# Patient Record
Sex: Female | Born: 1980 | Race: Black or African American | Hispanic: No | Marital: Married | Smoking: Never smoker
Health system: Southern US, Community
[De-identification: ages and names within clinical notes are randomized; demographics above are authoritative.]

## PROBLEM LIST (undated history)

## (undated) DIAGNOSIS — T7840XA Allergy, unspecified, initial encounter: Secondary | ICD-10-CM

## (undated) DIAGNOSIS — J45909 Unspecified asthma, uncomplicated: Secondary | ICD-10-CM

## (undated) DIAGNOSIS — F32A Depression, unspecified: Secondary | ICD-10-CM

## (undated) DIAGNOSIS — F419 Anxiety disorder, unspecified: Secondary | ICD-10-CM

## (undated) DIAGNOSIS — G43909 Migraine, unspecified, not intractable, without status migrainosus: Secondary | ICD-10-CM

## (undated) DIAGNOSIS — I10 Essential (primary) hypertension: Secondary | ICD-10-CM

## (undated) DIAGNOSIS — L509 Urticaria, unspecified: Secondary | ICD-10-CM

## (undated) DIAGNOSIS — H9190 Unspecified hearing loss, unspecified ear: Secondary | ICD-10-CM

## (undated) DIAGNOSIS — IMO0001 Reserved for inherently not codable concepts without codable children: Secondary | ICD-10-CM

## (undated) DIAGNOSIS — F329 Major depressive disorder, single episode, unspecified: Secondary | ICD-10-CM

## (undated) DIAGNOSIS — L309 Dermatitis, unspecified: Secondary | ICD-10-CM

## (undated) HISTORY — DX: Reserved for inherently not codable concepts without codable children: IMO0001

## (undated) HISTORY — DX: Dermatitis, unspecified: L30.9

## (undated) HISTORY — PX: BREAST LUMPECTOMY: SHX2

## (undated) HISTORY — DX: Urticaria, unspecified: L50.9

## (undated) HISTORY — DX: Unspecified hearing loss, unspecified ear: H91.90

## (undated) HISTORY — PX: GASTRIC BYPASS: SHX52

---
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
## 2002-03-09 ENCOUNTER — Encounter: Payer: Self-pay | Admitting: Emergency Medicine

## 2002-03-09 ENCOUNTER — Emergency Department (HOSPITAL_COMMUNITY): Admission: EM | Admit: 2002-03-09 | Discharge: 2002-03-09 | Payer: Self-pay | Admitting: Emergency Medicine

## 2002-07-31 ENCOUNTER — Other Ambulatory Visit: Admission: RE | Admit: 2002-07-31 | Discharge: 2002-07-31 | Payer: Self-pay | Admitting: Family Medicine

## 2003-09-03 ENCOUNTER — Emergency Department (HOSPITAL_COMMUNITY): Admission: EM | Admit: 2003-09-03 | Discharge: 2003-09-03 | Payer: Self-pay | Admitting: Emergency Medicine

## 2004-02-15 ENCOUNTER — Emergency Department (HOSPITAL_COMMUNITY): Admission: EM | Admit: 2004-02-15 | Discharge: 2004-02-16 | Payer: Self-pay | Admitting: Emergency Medicine

## 2004-02-15 ENCOUNTER — Emergency Department (HOSPITAL_COMMUNITY): Admission: EM | Admit: 2004-02-15 | Discharge: 2004-02-15 | Payer: Self-pay | Admitting: Emergency Medicine

## 2004-12-11 ENCOUNTER — Emergency Department (HOSPITAL_COMMUNITY): Admission: EM | Admit: 2004-12-11 | Discharge: 2004-12-12 | Payer: Self-pay | Admitting: Emergency Medicine

## 2004-12-13 ENCOUNTER — Ambulatory Visit (HOSPITAL_COMMUNITY): Admission: RE | Admit: 2004-12-13 | Discharge: 2004-12-13 | Payer: Self-pay | Admitting: Emergency Medicine

## 2004-12-15 ENCOUNTER — Emergency Department (HOSPITAL_COMMUNITY): Admission: EM | Admit: 2004-12-15 | Discharge: 2004-12-15 | Payer: Self-pay | Admitting: Emergency Medicine

## 2005-01-22 ENCOUNTER — Other Ambulatory Visit: Admission: RE | Admit: 2005-01-22 | Discharge: 2005-01-22 | Payer: Self-pay | Admitting: Family Medicine

## 2007-06-09 ENCOUNTER — Ambulatory Visit (HOSPITAL_BASED_OUTPATIENT_CLINIC_OR_DEPARTMENT_OTHER): Admission: RE | Admit: 2007-06-09 | Discharge: 2007-06-09 | Payer: Self-pay | Admitting: Orthopedic Surgery

## 2007-08-01 ENCOUNTER — Emergency Department (HOSPITAL_COMMUNITY): Admission: EM | Admit: 2007-08-01 | Discharge: 2007-08-01 | Payer: Self-pay | Admitting: Emergency Medicine

## 2008-07-04 ENCOUNTER — Ambulatory Visit (HOSPITAL_COMMUNITY): Admission: RE | Admit: 2008-07-04 | Discharge: 2008-07-04 | Payer: Self-pay | Admitting: Obstetrics and Gynecology

## 2008-08-02 ENCOUNTER — Encounter: Admission: RE | Admit: 2008-08-02 | Discharge: 2008-08-02 | Payer: Self-pay | Admitting: Obstetrics and Gynecology

## 2009-08-22 ENCOUNTER — Encounter: Admission: RE | Admit: 2009-08-22 | Discharge: 2009-08-22 | Payer: Self-pay | Admitting: Occupational Medicine

## 2010-03-01 ENCOUNTER — Emergency Department (HOSPITAL_BASED_OUTPATIENT_CLINIC_OR_DEPARTMENT_OTHER): Admission: EM | Admit: 2010-03-01 | Discharge: 2010-03-01 | Payer: Self-pay | Admitting: Emergency Medicine

## 2010-03-03 ENCOUNTER — Ambulatory Visit: Payer: Self-pay | Admitting: Diagnostic Radiology

## 2010-03-03 ENCOUNTER — Ambulatory Visit (HOSPITAL_BASED_OUTPATIENT_CLINIC_OR_DEPARTMENT_OTHER): Admission: RE | Admit: 2010-03-03 | Discharge: 2010-03-03 | Payer: Self-pay | Admitting: Emergency Medicine

## 2010-10-31 LAB — URINALYSIS, ROUTINE W REFLEX MICROSCOPIC
Ketones, ur: NEGATIVE mg/dL
Specific Gravity, Urine: 1.01 (ref 1.005–1.030)
Urobilinogen, UA: 1 mg/dL (ref 0.0–1.0)

## 2010-10-31 LAB — DIFFERENTIAL
Basophils Absolute: 0.1 10*3/uL (ref 0.0–0.1)
Lymphs Abs: 1.8 10*3/uL (ref 0.7–4.0)
Monocytes Relative: 5 % (ref 3–12)

## 2010-10-31 LAB — COMPREHENSIVE METABOLIC PANEL
AST: 26 U/L (ref 0–37)
Albumin: 3.9 g/dL (ref 3.5–5.2)
Alkaline Phosphatase: 66 U/L (ref 39–117)
Calcium: 8.9 mg/dL (ref 8.4–10.5)
Chloride: 106 mEq/L (ref 96–112)
GFR calc Af Amer: >60 mL/min (ref >60)
Glucose, Bld: 93 mg/dL (ref 70–99)
Sodium: 140 mEq/L (ref 135–145)
Total Bilirubin: 0.7 mg/dL (ref 0.3–1.2)

## 2010-10-31 LAB — CBC
Hemoglobin: 13.1 g/dL (ref 12.0–15.0)
MCHC: 33.3 g/dL (ref 30.0–36.0)
MCV: 86.9 fL (ref 78.0–100.0)
Platelets: 253 10*3/uL (ref 150–400)
RDW: 13.7 % (ref 11.5–15.5)

## 2010-10-31 LAB — PREGNANCY, URINE: Preg Test, Ur: NEGATIVE

## 2010-12-29 NOTE — Op Note (Signed)
[name], [name]                 ACCOUNT NO.:  [id number]   MEDICAL RECORD NO.:  [id number]          PATIENT TYPE:  AMB   LOCATION:  DSC                          FACILITY:  [facility]   PHYSICIAN:  Katy Fitch. Sypher, M.D. DATE OF BIRTH:  01-17-1981   DATE OF PROCEDURE:  06/09/2007  DATE OF DISCHARGE:                               OPERATIVE REPORT   PREOPERATIVE DIAGNOSES:  1. 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  2. Recurrent dorsal ganglion adjacent to the distal pole scaphoid and      capitate, right wrist.  3. Status post prior excision of a dorsal ganglion.   POSTOPERATIVE DIAGNOSES:  1. 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  2. Recurrent dorsal ganglion adjacent to the distal pole scaphoid and      capitate, right wrist.  3. Status post prior excision of a dorsal ganglion.   OPERATION:  1. Right wrist arthrotomy with curettage of myxoid cyst dorsal aspect      of right lunate.  2. Resection of recurrent dorsal ganglion adjacent to distal pole of      scaphoid and capitate, right wrist.   OPERATIONS:  Josephine Igo, M.D.   ASSISTANT:  Annye Rusk, P.A.-C.   ANESTHESIA:  General by LMA; supervising anesthesiologist Dr. Michelle Piper.   SPECIMEN:  Is resected ganglion cyst wall.  Mucin was discarded.   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   She had a prior dorsal ganglion resection several years ago with  temporary resolution of her mass and pain predicament.   During the past year, she has developed recurrent mass on the dorsal  aspect of her right wrist with pain at the limits of motion in  dorsiflexion and palmar flexion.   Her clinical examination revealed pain out of proportion to a typical  dorsal extracapsular ganglion.  Therefore, plain films and MRI were  obtained.  She was noted  to have an intracapsular cyst eroding the  dorsal aspect of lunate.   This is likely the source of her discomfort rather than the recurrent  extracapsular ganglion.   Based on our MRI findings, we recommended that she proceed with  exploration of dorsal aspect of the wrist with arthrotomy to remove the  intracapsular ganglion and resection of her extracapsular cyst.   Preoperatively, we identified with her insurance carrier that this was a  pair of procedures rather than a single procedure.  After informed  consent, she was brought to the operating room at this time.   PROCEDURE:  Carla Buck was brought to the operating room and placed  in supine position on the operating table.   Following the induction of general anesthesia by LMA, the right arm was  prepped with Hibiclens soap solution and sterilely draped.  A pneumatic  tourniquet was applied to the proximal right brachium.   Following exsanguination of right arm with Esmarch bandage, the arterial  tourniquet on the proximal  brachium was inflated to 230 mmHg.   Procedure commenced with resection of the prior transverse surgical  scar.  Subcutaneous tissues were carefully divided taking care to  identify the extensor tendons of the fourth dorsal compartment and the  radial wrist extensors.   Blunt retractors were placed followed by circumferential dissection of  the myxoid cyst emanating from the dorsal aspect the capsule adjacent to  the capitate.  This was circumferentially dissected and amputated off of  the dorsal capsule.   Taking care to preserve the dorsal radiocarpal and dorsal intercarpal  ligaments, a transverse arthrotomy was created in the wrist joint  followed by identification of the scapholunate interosseous ligament.  I  could not identify a cyst on the scapholunate interosseous ligament.  However, there was an erosion on the dorsal surface of the lunate with a  membranous area of contained mucin.    This was meticulously removed with a fine rongeur with curettage of the  dorsal aspect of lunate to bleeding bone surface.   The scapholunate interosseous ligament was inspected and found be  otherwise intact.   The wound was then repaired by capsular closure simply by positioning  the wrist in dorsiflexion, followed by repair of the skin with subdermal  suture of 4-0 Vicryl and a running intradermal 3-0 Prolene with Steri-  Strips.   Ms. Jimmey Ralph was given 1 gram of vancomycin as an IV prophylactic  antibiotic.  Her arm was prepped with Hibiclens due to an iodine  allergy.   There were no apparent complications noted.      Katy Fitch Sypher, M.D.  Electronically Signed     RVS/MEDQ  D:  06/09/2007  T:  06/10/2007  Job:  [id number]   cc:   Katy Fitch. Sypher, M.D.

## 2011-05-26 LAB — POCT HEMOGLOBIN-HEMACUE
Hemoglobin: 13.5
Operator id: 123881

## 2015-11-15 HISTORY — PX: ABDOMINAL HYSTERECTOMY: SHX81

## 2017-02-28 DIAGNOSIS — N6099 Unspecified benign mammary dysplasia of unspecified breast: Secondary | ICD-10-CM | POA: Insufficient documentation

## 2018-04-25 DIAGNOSIS — F411 Generalized anxiety disorder: Secondary | ICD-10-CM | POA: Insufficient documentation

## 2018-09-16 HISTORY — PX: OOPHORECTOMY: SHX86

## 2019-05-14 ENCOUNTER — Encounter (HOSPITAL_COMMUNITY): Payer: Self-pay

## 2019-05-14 ENCOUNTER — Other Ambulatory Visit: Payer: Self-pay

## 2019-05-14 ENCOUNTER — Ambulatory Visit (HOSPITAL_COMMUNITY)
Admission: EM | Admit: 2019-05-14 | Discharge: 2019-05-14 | Disposition: A | Discharge status: Home / Self Care | Payer: BC Managed Care – PPO | Attending: Family Medicine | Admitting: Family Medicine

## 2019-05-14 DIAGNOSIS — R3989 Other symptoms and signs involving the genitourinary system: Secondary | ICD-10-CM

## 2019-05-14 DIAGNOSIS — N898 Other specified noninflammatory disorders of vagina: Secondary | ICD-10-CM | POA: Diagnosis not present

## 2019-05-14 HISTORY — DX: Unspecified asthma, uncomplicated: J45.909

## 2019-05-14 HISTORY — DX: Anxiety disorder, unspecified: F41.9

## 2019-05-14 HISTORY — DX: Allergy, unspecified, initial encounter: T78.40XA

## 2019-05-14 HISTORY — DX: Depression, unspecified: F32.A

## 2019-05-14 HISTORY — DX: Migraine, unspecified, not intractable, without status migrainosus: G43.909

## 2019-05-14 LAB — POCT URINALYSIS DIP (DEVICE)
Bilirubin Urine: NEGATIVE
Glucose, UA: NEGATIVE mg/dL
Hgb urine dipstick: NEGATIVE
Ketones, ur: NEGATIVE mg/dL
Leukocytes,Ua: NEGATIVE
Nitrite: NEGATIVE
Protein, ur: NEGATIVE mg/dL
Specific Gravity, Urine: 1.015 (ref 1.005–1.030)
Urobilinogen, UA: 0.2 mg/dL (ref 0.0–1.0)
pH: 7 (ref 5.0–8.0)

## 2019-05-14 MED ORDER — VALACYCLOVIR HCL 1 G PO TABS
1000.0000 mg | ORAL_TABLET | Freq: Three times a day (TID) | ORAL | 0 refills | Status: AC
Start: 2019-05-14 — End: 2019-05-28

## 2019-05-14 MED ORDER — METRONIDAZOLE 500 MG PO TABS: 500.0000 mg | ORAL_TABLET | Freq: Two times a day (BID) | ORAL | 0 refills | Status: AC

## 2019-05-14 NOTE — Discharge Instructions (Signed)
Urine normal Swabs pending Begin metronidazole twice daily for the next week to treat for bacterial vaginosis.  Please read attached about bacterial vaginosis.  Avoid scented soaps. Please also begin taking Valtrex 3 times daily just in case the herpes swab returns positive. In the meantime he may apply a barrier cream such as Vaseline or Aquaphor to the area to prevent irritation

## 2019-05-14 NOTE — ED Provider Notes (Signed)
[facility]    CSN: [id number] Arrival date & time: 05/14/19  1549      History   Chief Complaint Chief Complaint  Patient presents with  . Urinary Tract Infection    HPI Carla Buck is a 38 y.o. female history of previous gastric bypass, hysterectomy, asthma, migraines presenting today for evaluation of possible UTI.  Patient states that she gets recurrent UTI.  She has had recently dysuria as well as pain around her clitoral area.  Denies abdominal pain, has had some mild back pain, but is related this to moving heavy furniture recently.  She has noticed some increase in discharge, but was unsure if this was related to changing medicines for her anxiety/depression.  She has recently become sexually active again in the past month.  Denies specific concerns for STDs.  Denies any rashes or lesions.  She does recall that she recently changed washing powders and body washes prior to onset of symptoms 2 weeks ago.  HPI  Past Medical History:  Diagnosis Date  . Allergy   . Anxiety   . Asthma   . Depression   . Migraine     There are no active problems to display for this patient.   Past Surgical History:  Procedure Laterality Date  . ABDOMINAL HYSTERECTOMY    . BREAST LUMPECTOMY    . GASTRIC BYPASS      OB History   No obstetric history on file.      Home Medications    Prior to Admission medications   Medication Sig Start Date End Date Taking? Authorizing Provider  metroNIDAZOLE (FLAGYL) 500 MG tablet Take 1 tablet (500 mg total) by mouth 2 (two) times daily for 7 days. 05/14/19 05/21/19  Wieters, Hallie C, PA-C  valACYclovir (VALTREX) 1000 MG tablet Take 1 tablet (1,000 mg total) by mouth 3 (three) times daily for 14 days. 05/14/19 05/28/19  Wieters, Elesa Hacker, PA-C    Family History Family History  Family history unknown: Yes    Social History Social History   Tobacco Use  . Smoking status: Never Smoker  . Smokeless tobacco: Never Used   Substance Use Topics  . Alcohol use: Not on file  . Drug use: Not on file     Allergies   Gluten meal, Iodine, Lidocaine, Orange fruit [citrus], Peanut-containing drug products, Penicillins, Shellfish allergy, Soy allergy, Tomato, and Xolair [omalizumab]   Review of Systems Review of Systems  Constitutional: Negative for fever.  Respiratory: Negative for shortness of breath.   Cardiovascular: Negative for chest pain.  Gastrointestinal: Negative for abdominal pain, diarrhea, nausea and vomiting.  Genitourinary: Positive for dysuria and vaginal discharge. Negative for flank pain, genital sores, hematuria, menstrual problem, vaginal bleeding and vaginal pain.  Musculoskeletal: Negative for back pain.  Skin: Negative for rash.  Neurological: Negative for dizziness, light-headedness and headaches.     Physical Exam Triage Vital Signs ED Triage Vitals  Enc Vitals Group     BP 05/14/19 1618 124/87     Pulse Rate 05/14/19 1618 75     Resp 05/14/19 1618 18     Temp 05/14/19 1618 98.3 F (36.8 C)     Temp Source 05/14/19 1618 Oral     SpO2 05/14/19 1618 99 %     Weight --      Height --      Head Circumference --      Peak Flow --      Pain Score 05/14/19 1623 5  Pain Loc --      Pain Edu? --      Excl. in [place]? --    No data found.  Updated Vital Signs BP 124/87 (BP Location: Right Arm)   Pulse 75   Temp 98.3 F (36.8 C) (Oral)   Resp 18   SpO2 99%   Visual Acuity Right Eye Distance:   Left Eye Distance:   Bilateral Distance:    Right Eye Near:   Left Eye Near:    Bilateral Near:     Physical Exam Vitals signs and nursing note reviewed.  Constitutional:      Appearance: She is well-developed.     Comments: No acute distress  HENT:     Head: Normocephalic and atraumatic.     Nose: Nose normal.  Eyes:     Conjunctiva/sclera: Conjunctivae normal.  Neck:     Musculoskeletal: Neck supple.  Cardiovascular:     Rate and Rhythm: Normal rate.  Pulmonary:      Effort: Pulmonary effort is normal. No respiratory distress.  Abdominal:     General: There is no distension.     Comments: Nontender to light palpation throughout abdomen  Multiple well-healed laparoscopy scars  Genitourinary:    Comments: Normal external genitalia, clitoris appears erythematous with white appearing sore noted on clitoris as well as within clitoral hood, tender to touch  Vaginal mucosa pink, white milky discharge noted, cervix absent Musculoskeletal: Normal range of motion.  Skin:    General: Skin is warm and dry.  Neurological:     Mental Status: She is alert and oriented to person, place, and time.      UC Treatments / Results  Labs (all labs ordered are listed, but only abnormal results are displayed) Labs Reviewed  HSV CULTURE AND TYPING  POCT URINALYSIS DIP (DEVICE)  CERVICOVAGINAL ANCILLARY ONLY    EKG   Radiology No results found.  Procedures Procedures (including critical care time)  Medications Ordered in UC Medications - No data to display  Initial Impression / Assessment and Plan / UC Course  I have reviewed the triage vital signs and the nursing notes.  Pertinent labs & imaging results that were available during my care of the patient were reviewed by me and considered in my medical decision making (see chart for details).     UA unremarkable, possible BV causing dysuria as patient recently changed soaps.  Will empirically treat for this with Flagyl.  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  Will go ahead and initiate on Valtrex, also recommended applying barrier creams to help with irritation.  Continue to monitor,Discussed strict return precautions. Patient verbalized understanding and is agreeable with plan.  Final Clinical Impressions(s) / UC Diagnoses   Final diagnoses:  Vaginal discharge  Genital sore     Discharge Instructions     Urine normal Swabs pending Begin metronidazole twice daily for the next week to treat for  bacterial vaginosis.  Please read attached about bacterial vaginosis.  Avoid scented soaps. Please also begin taking Valtrex 3 times daily just in case the herpes swab returns positive. In the meantime he may apply a barrier cream such as Vaseline or Aquaphor to the area to prevent irritation    ED Prescriptions    Medication Sig Dispense Auth. Provider   metroNIDAZOLE (FLAGYL) 500 MG tablet Take 1 tablet (500 mg total) by mouth 2 (two) times daily for 7 days. 14 tablet Wieters, Hallie C, PA-C   valACYclovir (VALTREX) 1000 MG tablet Take  1 tablet (1,000 mg total) by mouth 3 (three) times daily for 14 days. 42 tablet Wieters, Lowrey C, PA-C     PDMP not reviewed this encounter.   Janith Lima, [place] 05/14/19 [phone]

## 2019-05-14 NOTE — ED Triage Notes (Signed)
Pt presents with vaginal pain.

## 2019-05-15 LAB — CERVICOVAGINAL ANCILLARY ONLY
Bacterial Vaginitis (gardnerella): NEGATIVE
Candida Glabrata: NEGATIVE
Candida Vaginitis: POSITIVE — AB
Chlamydia: NEGATIVE
Molecular Disclaimer: NEGATIVE
Molecular Disclaimer: NEGATIVE
Molecular Disclaimer: NEGATIVE
Molecular Disclaimer: NEGATIVE
Molecular Disclaimer: NORMAL
Molecular Disclaimer: NORMAL
Neisseria Gonorrhea: NEGATIVE
Trichomonas: NEGATIVE

## 2019-05-16 LAB — HSV CULTURE AND TYPING

## 2019-05-17 ENCOUNTER — Telehealth (HOSPITAL_COMMUNITY): Payer: Self-pay | Admitting: Emergency Medicine

## 2019-05-17 MED ORDER — FLUCONAZOLE 150 MG PO TABS: 150.0000 mg | ORAL_TABLET | Freq: Once | ORAL | 0 refills | Status: AC

## 2019-05-17 NOTE — Telephone Encounter (Signed)
Test for candida (yeast) was positive.  Prescription for fluconazole 150mg  po now, repeat dose in 3d if needed, #2 no refills, sent to the pharmacy of record.  Recheck or followup with PCP for further evaluation if symptoms are not improving.    postiive for hSV 1, pt on valtrex  Patient contacted and made aware of    results, all questions answered

## 2019-06-18 ENCOUNTER — Other Ambulatory Visit: Payer: Self-pay

## 2019-06-18 ENCOUNTER — Ambulatory Visit: Payer: BC Managed Care – PPO | Attending: Internal Medicine | Admitting: Internal Medicine

## 2019-06-18 ENCOUNTER — Encounter: Payer: Self-pay | Admitting: Internal Medicine

## 2019-06-18 VITALS — BP 112/84 | HR 99 | Temp 98.5°F | Resp 16 | Ht 65.0 in | Wt 187.4 lb

## 2019-06-18 DIAGNOSIS — M67449 Ganglion, unspecified hand: Secondary | ICD-10-CM

## 2019-06-18 DIAGNOSIS — F32A Depression, unspecified: Secondary | ICD-10-CM

## 2019-06-18 DIAGNOSIS — F329 Major depressive disorder, single episode, unspecified: Secondary | ICD-10-CM

## 2019-06-18 DIAGNOSIS — L309 Dermatitis, unspecified: Secondary | ICD-10-CM

## 2019-06-18 DIAGNOSIS — J4540 Moderate persistent asthma, uncomplicated: Secondary | ICD-10-CM

## 2019-06-18 DIAGNOSIS — Z9884 Bariatric surgery status: Secondary | ICD-10-CM

## 2019-06-18 DIAGNOSIS — J3089 Other allergic rhinitis: Secondary | ICD-10-CM

## 2019-06-18 DIAGNOSIS — D242 Benign neoplasm of left breast: Secondary | ICD-10-CM

## 2019-06-18 DIAGNOSIS — E669 Obesity, unspecified: Secondary | ICD-10-CM

## 2019-06-18 DIAGNOSIS — D241 Benign neoplasm of right breast: Secondary | ICD-10-CM

## 2019-06-18 DIAGNOSIS — F419 Anxiety disorder, unspecified: Secondary | ICD-10-CM

## 2019-06-18 DIAGNOSIS — M25561 Pain in right knee: Secondary | ICD-10-CM

## 2019-06-18 DIAGNOSIS — Z8669 Personal history of other diseases of the nervous system and sense organs: Secondary | ICD-10-CM

## 2019-06-18 NOTE — Progress Notes (Signed)
Patient ID: Carla Buck, female    DOB: 1981-06-07  MRN: [id number]  CC: New Patient (Initial Visit)   Subjective: Carla Buck is a 38 y.o. female who presents for new pt visit Her concerns today include:   Previous PCP was Dr. Laveda Abbe Only in [place], [place].  Last seen in July 2020. Patient with history of asthma,  Allergies (food allergies and environmental allergies), papilloma (2 RT/1 LT breast), dep/anx/insomnia (followed by Dr. 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  She has allergies to nuts, soy, gluten, corn, tomatoes, citrus, shellfish and sesame.  She also has environmental allergies to tree and grass pollen, roaches, dust mites etc.  She would like to be referred to a pulmonologist and allergist.  She also has history of eczema. She feels her asthma is under good control with Breo inhaler. She uses ProAir PRN -4 x in last 2 wks, Combivent Mned last used 2 mths ago, and Breo once a day.  Also on Singular and Xyzal  Migraines:  Doing okay on Maxalt and Verapamil.  Occurs 3 x a wk down from 6 x a wk.  Tried Imitrex oral and nasal and neither worked.    Wgh reduction surgery: She had Roux-en-Y procedure in March of this year.  Started at 249 lbs.  She has lost 62 pounds so far.  Doing okay with eating habits Her personal wgh goal is to get to 150-160 lb, however, she said the surgeon has recommended a weight of between 170-189 lbs  Since hysterectomy, inc UTI.  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  She sees an Materials engineer in [place].  She has been on tamoxifen for 3 years.  Plan is to keep her on it for a total of  5 years.  Her oncologist recommended that she gets liver panel and vitamin D levels checked every several months through her PCP and have them sent on to him.    HM:  Had flu shot 3 wks ago at [facility], tdap 2 yrs ago    Past medical, surgical, family history, social history reviewed. Current Outpatient Medications on File Prior to Visit  Medication Sig Dispense Refill  . acetaminophen (TYLENOL) 325 MG tablet Take 325 mg by mouth every 6 (six) hours as needed.    Marland Kitchen albuterol (VENTOLIN HFA) 108 (90 Base) MCG/ACT inhaler Inhale 2 puffs into the lungs every 6 (six) hours as needed for wheezing or shortness of breath.    . calcium carbonate (OSCAL) 1500 (600 Ca) MG TABS tablet Take 1,500 mg by mouth daily with breakfast.    . diphenhydrAMINE (BENADRYL) 25 mg capsule Take 25 mg by mouth every 6 (six) hours as needed.    Marland Kitchen EPINEPHrine (EPIPEN 2-PAK) 0.3 mg/0.3 mL IJ SOAJ injection Inject 0.3 mg into the muscle as needed for anaphylaxis.    Marland Kitchen ergocalciferol (VITAMIN D2) 1.25 MG (50000 UT) capsule Take 50,000 Units by mouth once a week.    . ferrous sulfate 325 (65 FE) MG tablet Take 325 mg by mouth daily with breakfast.    . fluticasone furoate-vilanterol (BREO ELLIPTA) 200-25 MCG/INH AEPB Inhale 1 puff into the lungs  daily.    . ipratropium-albuterol (DUONEB) 0.5-2.5 (3) MG/3ML SOLN Take 3 mLs by nebulization every 6 (six) hours as needed.    Marland Kitchen levocetirizine (XYZAL) 5 MG tablet Take 5 mg by mouth every evening.    . montelukast (SINGULAIR) 10 MG tablet Take 10 mg by mouth at bedtime.    . Multiple Vitamins-Minerals (BARIATRIC MULTIVITAMINS/IRON PO) Take 1 tablet by mouth.    . nitrofurantoin, macrocrystal-monohydrate, (MACROBID) 100 MG capsule Take 100 mg by mouth 2 (two) times daily.    . ondansetron (ZOFRAN-ODT) 4 MG disintegrating tablet Take 4 mg by mouth every 8 (eight) hours as needed for nausea or vomiting.    . predniSONE (DELTASONE) 20 MG tablet Take 20 mg by mouth daily as needed (Unspecified).     . propranolol (INDERAL) 10 MG tablet Take 10 mg by mouth as needed.    . rizatriptan (MAXALT) 5 MG tablet Take 5 mg by mouth as needed for migraine. May repeat in 2 hours if needed    . tamoxifen (NOLVADEX) 20 MG tablet Take 50 mg by mouth daily.     . traZODone (DESYREL) 150 MG tablet Take 150 mg by mouth at bedtime as needed for sleep.    Marland Kitchen triamcinolone ointment (KENALOG) 0.1 % Apply 1 application topically 2 (two) times daily.    Marland Kitchen venlafaxine (EFFEXOR) 100 MG tablet Take 100 mg by mouth 2 (two) times daily.    . verapamil (CALAN) 80 MG tablet Take 80 mg by mouth 3 (three) times daily.      No current facility-administered medications on file prior to visit.     Allergies  Allergen Reactions  . Bee Venom Anaphylaxis  . Peanut-Containing Drug Products Anaphylaxis  . Pollen Extract Shortness Of Breath    Tree and grass   . Shellfish Allergy Anaphylaxis  . Wasp Venom Anaphylaxis  . Xolair [Omalizumab] Anaphylaxis  . Amoxicillin Hives  . Bug Itch Releaf [Misc Natural Products] Hives    Roaches, ants and dustmites  . Contrave [Naltrexone-Bupropion Hcl Er] Hives  . Corn-Containing Products     GI unset  . Dust Mite Extract     Asthma trigger  . Gluten Meal     GI upset, HIVeS  . Iodine Hives  . Lidocaine Hives  . Orange Fruit [Citrus] Hives  . Penicillins Hives  . Sesame Oil Diarrhea    GI upset  . Soy Allergy Hives    GI upset  . Tetracaine Hives  . Tomato Hives    Social History   Socioeconomic History  . Marital status: Married    Spouse name: Not on file  . Number of children: 3  . Years of education: Not on file  . Highest education level: Not on file  Occupational History  . Not on file  Social Needs  . Financial resource strain: Not on file  . Food insecurity    Worry: Not on file    Inability: Not on file  . Transportation needs    Medical: Not on file    Non-medical: Not on file  Tobacco Use  . Smoking status: Never Smoker  . Smokeless tobacco:  Never Used  Substance and Sexual Activity  . Alcohol use: Not on file    Comment: occasional  . Drug use: Never  . Sexual activity: Not on file  Lifestyle  . Physical activity    Days per week: Not on file    Minutes per session: Not on file  . Stress: Not  on file  Relationships  . Social Herbalist on phone: Not on file    Gets together: Not on file    Attends religious service: Not on file    Active member of club or organization: Not on file    Attends meetings of clubs or organizations: Not on file    Relationship status: Not on file  . Intimate partner violence    Fear of current or ex partner: Not on file    Emotionally abused: Not on file    Physically abused: Not on file    Forced sexual activity: Not on file  Other Topics Concern  . Not on file  Social History Narrative  . Not on file    Family History  Problem Relation Age of Onset  . Hypertension Mother   . Depression Father   . Bipolar disorder Sister   . Hypertension Maternal Grandmother   . Hypertension Paternal Grandmother   . Diabetes Paternal Grandmother   . Hypertension Paternal Grandfather     Past Surgical History:  Procedure Laterality Date  . ABDOMINAL HYSTERECTOMY  11/2015   fibroids  . BREAST LUMPECTOMY    . GASTRIC BYPASS    . OOPHORECTOMY Left 09/2018   torsion    ROS: Review of Systems  Musculoskeletal:       Patient complains of pain in the right knee x2 weeks.  She has history of torn meniscus and chip patella in this knee.  However she was doing okay until 2 weeks ago after she went for a long hike.  She feels that she has swelling behind the kneecap.  She has tried icing it and does not feel like it is getting better.  She would like to be able to continue exercising to achieve further weight loss having had weight reduction surgery. She also expressed concern of a small knot that she feels at the base of the left index finger on the palmar surface.  It has been there for  a few months.  Slight increase in size.  She thinks it may be a cyst.  It is not painful.     PHYSICAL EXAM: BP 112/84   Pulse 99   Temp 98.5 F (36.9 C) (Oral)   Resp 16   Ht 5\' 5"  (1.651 m)   Wt 187 lb 6.4 oz (85 kg)   SpO2 100%   BMI 31.18 kg/m   Physical Exam  General appearance - alert, well appearing, young to middle-aged African-American female and in no distress Mental status - normal mood, behavior, speech, dress, motor activity, and thought processes Eyes - pupils equal and reactive, extraocular eye movements intact Nose - normal and patent, no erythema, discharge or polyps Mouth - mucous membranes moist, pharynx normal without lesions Neck - supple, no significant adenopathy Chest - clear to auscultation, no wheezes, rales or rhonchi, symmetric air entry Heart - normal rate, regular rhythm, normal S1, S2, no murmurs, rubs, clicks or gallops Musculoskeletal -right knee: No edema or erythema.  No point tenderness.  She has moderate tactile and audible crepitus on passive range of motion. Left index finger: She has a tiny palpable round mobile subcutaneous lesion at the base of the finger palmar surface. Extremities -no lower extremity edema.  CMP Latest Ref Rng & Units 06/18/2019 03/01/2010  Glucose 65 - 99 mg/dL 84 93  BUN 6 - 20 mg/dL 15 7  Creatinine 0.57 - 1.00 mg/dL 0.66 0.7  Sodium 134 - 144 mmol/L  138 140  Potassium 3.5 - 5.2 mmol/L 4.4 4.2  Chloride 96 - 106 mmol/L 101 106  CO2 20 - 29 mmol/L 26 26  Calcium 8.7 - 10.2 mg/dL 8.8 8.9  Total Protein 6.0 - 8.5 g/dL 6.8 7.5  Total Bilirubin 0.0 - 1.2 mg/dL 0.2 0.7  Alkaline Phos 39 - 117 IU/L 60 66  AST 0 - 40 IU/L 20 26  ALT 0 - 32 IU/L 19 17   Lipid Panel     Component Value Date/Time   CHOL 153 06/18/2019 1507   TRIG 52 06/18/2019 1507   HDL 72 06/18/2019 1507   CHOLHDL 2.1 06/18/2019 1507   LDLCALC 70 06/18/2019 1507    CBC    Component Value Date/Time   WBC 6.3 06/18/2019 1507   WBC 6.4  03/01/2010 1235   RBC 4.47 06/18/2019 1507   RBC 4.53 03/01/2010 1235   HGB 13.5 06/18/2019 1507   HCT 42.3 06/18/2019 1507   PLT 296 06/18/2019 1507   MCV 95 06/18/2019 1507   MCH 30.2 06/18/2019 1507   MCH 29.0 03/01/2010 1235   MCHC 31.9 06/18/2019 1507   MCHC 33.3 03/01/2010 1235   RDW 13.9 06/18/2019 1507   LYMPHSABS 1.8 03/01/2010 1235   MONOABS 0.3 03/01/2010 1235   EOSABS 0.1 03/01/2010 1235   BASOSABS 0.1 03/01/2010 1235    ASSESSMENT AND PLAN:  1. History of migraine Patient currently on verapamil and Maxalt with frequency of headaches being about 3 times a week.  I will refer her to neurology to see if frequency can be decreased further. - Ambulatory referral to Neurology  2. Moderate persistent asthma, unspecified whether complicated We will refer patient to the Asthma and [place] - Ambulatory referral to Allergy  3. Environmental and seasonal allergies See #2 above - Ambulatory referral to Allergy  4. Acute pain of right knee She no doubt has some arthritis in the knee from previous injuries.  However she would like to get back to her baseline so that she can continue exercising to achieve further weight loss.  Will refer to sports medicine - Ambulatory referral to Sports Medicine  5. Ganglion cyst of finger - Ambulatory referral to Sports Medicine  6. Eczema, unspecified type   7. Obesity (BMI 30-39.9) Commended her on weight loss so far.  Encouraged her to continue healthy eating habits with smaller portion sizes.  We will get her to sports medicine to see if they can get her right knee better so that she can continue exercising the way she would like. - CBC - Comprehensive metabolic panel - Lipid panel  8. History of Roux-en-Y gastric bypass - CBC - Vitamin B12 - VITAMIN D 25 Hydroxy (Vit-D Deficiency, Fractures)  9. Papilloma of both breasts Patient prefers to continue following with the oncologist in [place].  10. Anxiety and  depression She is plugged into mental health services through her oncologist in [place] and would like to continue that for now.   Patient was given the opportunity to ask questions.  Patient verbalized understanding of the plan and was able to repeat key elements of the plan.   Orders Placed This Encounter  Procedures  . CBC  . Comprehensive metabolic panel  . Lipid panel  . Vitamin B12  . VITAMIN D 25 Hydroxy (Vit-D Deficiency, Fractures)  . Ambulatory referral to Neurology  . Ambulatory referral to Allergy  . Ambulatory referral to Sports Medicine     Requested Prescriptions    No prescriptions  requested or ordered in this encounter    Return in about 4 months (around 10/16/2019).  [name], MD, FACP

## 2019-06-18 NOTE — Patient Instructions (Signed)
Please sign a release for [place] to get your medical records from your previous PCP.

## 2019-06-19 DIAGNOSIS — Z9884 Bariatric surgery status: Secondary | ICD-10-CM | POA: Insufficient documentation

## 2019-06-19 DIAGNOSIS — F32A Depression, unspecified: Secondary | ICD-10-CM | POA: Insufficient documentation

## 2019-06-19 DIAGNOSIS — M67449 Ganglion, unspecified hand: Secondary | ICD-10-CM | POA: Insufficient documentation

## 2019-06-19 DIAGNOSIS — L309 Dermatitis, unspecified: Secondary | ICD-10-CM | POA: Insufficient documentation

## 2019-06-19 DIAGNOSIS — J4540 Moderate persistent asthma, uncomplicated: Secondary | ICD-10-CM | POA: Insufficient documentation

## 2019-06-19 DIAGNOSIS — J3089 Other allergic rhinitis: Secondary | ICD-10-CM | POA: Insufficient documentation

## 2019-06-19 DIAGNOSIS — E669 Obesity, unspecified: Secondary | ICD-10-CM | POA: Insufficient documentation

## 2019-06-19 DIAGNOSIS — L2089 Other atopic dermatitis: Secondary | ICD-10-CM | POA: Insufficient documentation

## 2019-06-19 DIAGNOSIS — D241 Benign neoplasm of right breast: Secondary | ICD-10-CM | POA: Insufficient documentation

## 2019-06-19 DIAGNOSIS — Z9889 Other specified postprocedural states: Secondary | ICD-10-CM | POA: Insufficient documentation

## 2019-06-19 DIAGNOSIS — F329 Major depressive disorder, single episode, unspecified: Secondary | ICD-10-CM | POA: Insufficient documentation

## 2019-06-19 DIAGNOSIS — D242 Benign neoplasm of left breast: Secondary | ICD-10-CM | POA: Insufficient documentation

## 2019-06-19 DIAGNOSIS — F419 Anxiety disorder, unspecified: Secondary | ICD-10-CM | POA: Insufficient documentation

## 2019-06-19 DIAGNOSIS — Z8669 Personal history of other diseases of the nervous system and sense organs: Secondary | ICD-10-CM | POA: Insufficient documentation

## 2019-06-19 LAB — VITAMIN B12: Vitamin B-12: 658 pg/mL (ref 232–1245)

## 2019-06-19 LAB — COMPREHENSIVE METABOLIC PANEL
ALT: 19 IU/L (ref 0–32)
AST: 20 IU/L (ref 0–40)
Albumin/Globulin Ratio: 2 (ref 1.2–2.2)
Albumin: 4.5 g/dL (ref 3.8–4.8)
Alkaline Phosphatase: 60 IU/L (ref 39–117)
BUN/Creatinine Ratio: 23 (ref 9–23)
BUN: 15 mg/dL (ref 6–20)
Bilirubin Total: 0.2 mg/dL (ref 0.0–1.2)
CO2: 26 mmol/L (ref 20–29)
Calcium: 8.8 mg/dL (ref 8.7–10.2)
Chloride: 101 mmol/L (ref 96–106)
Creatinine, Ser: 0.66 mg/dL (ref 0.57–1.00)
GFR calc Af Amer: 131 mL/min/{1.73_m2} (ref >59)
GFR calc non Af Amer: 114 mL/min/{1.73_m2} (ref >59)
Globulin, Total: 2.3 g/dL (ref 1.5–4.5)
Glucose: 84 mg/dL (ref 65–99)
Potassium: 4.4 mmol/L (ref 3.5–5.2)
Sodium: 138 mmol/L (ref 134–144)
Total Protein: 6.8 g/dL (ref 6.0–8.5)

## 2019-06-19 LAB — LIPID PANEL
Chol/HDL Ratio: 2.1 ratio (ref 0.0–4.4)
Cholesterol, Total: 153 mg/dL (ref 100–199)
HDL: 72 mg/dL (ref >39)
LDL Chol Calc (NIH): 70 mg/dL (ref 0–99)
Triglycerides: 52 mg/dL (ref 0–149)
VLDL Cholesterol Cal: 11 mg/dL (ref 5–40)

## 2019-06-19 LAB — CBC
Hematocrit: 42.3 % (ref 34.0–46.6)
Hemoglobin: 13.5 g/dL (ref 11.1–15.9)
MCH: 30.2 pg (ref 26.6–33.0)
MCHC: 31.9 g/dL (ref 31.5–35.7)
MCV: 95 fL (ref 79–97)
Platelets: 296 10*3/uL (ref 150–450)
RBC: 4.47 x10E6/uL (ref 3.77–5.28)
RDW: 13.9 % (ref 11.7–15.4)
WBC: 6.3 10*3/uL (ref 3.4–10.8)

## 2019-06-19 LAB — VITAMIN D 25 HYDROXY (VIT D DEFICIENCY, FRACTURES): Vit D, 25-Hydroxy: 52.5 ng/mL (ref 30.0–100.0)

## 2019-06-21 ENCOUNTER — Ambulatory Visit (INDEPENDENT_AMBULATORY_CARE_PROVIDER_SITE_OTHER): Payer: BC Managed Care – PPO | Admitting: Pediatrics

## 2019-06-21 ENCOUNTER — Other Ambulatory Visit: Payer: Self-pay

## 2019-06-21 ENCOUNTER — Ambulatory Visit
Admission: RE | Admit: 2019-06-21 | Discharge: 2019-06-21 | Disposition: A | Discharge status: Home / Self Care | Payer: BC Managed Care – PPO | Source: Ambulatory Visit | Attending: Sports Medicine | Admitting: Sports Medicine

## 2019-06-21 VITALS — BP 122/84 | Ht 65.0 in | Wt 184.3 lb

## 2019-06-21 DIAGNOSIS — M25561 Pain in right knee: Secondary | ICD-10-CM

## 2019-06-21 DIAGNOSIS — G8929 Other chronic pain: Secondary | ICD-10-CM | POA: Insufficient documentation

## 2019-06-21 NOTE — Assessment & Plan Note (Signed)
Acute on chronic, associated with significant tenderness to medial/lateral joint compartments, small joint effusion, and positive Thessaly's. Suspect may be multifactorial secondary to aggravated osteoarthritis with likely degenerative meniscal injury or new meniscal tear especially given early osteophyte formation, joint effusion, and abnormal medial meniscus visualized on bedside ultrasound and physical exam findings as above.  Reassuringly ROM is preserved without locking or instability. She has already been trialing conservative therapy without improvement for the past several weeks, will add on quadricep strengthening exercises and knee brace for further stability. -Obtain knee XR for initial evaluation -Obtain MRI of the right knee to further assess extent of injury, especially given extensive knee history -Start home knee/quadriceps strengthening exercises -Knee brace, fitted in the office today -Avoid aggravating activities as possible, heat/ice, Tylenol as needed

## 2019-06-21 NOTE — Progress Notes (Signed)
Carla Buck - 38 y.o. female MRN [id number]  Date of birth: May 07, 1981  SUBJECTIVE:   CC: Right knee pain  Right knee: 38 yo female presenting with anterior right knee for the past 3 weeks. Recently flared up after going on a 3.5 mile hike 3 weeks ago, denies any abnormal twisting motion or injury at that time. Prior to this, she has had intermittent right knee pain for several years that is similar in quality, seems to be getting worse. She describes pain as generalized aching with "quadricep spasming sensation". Pain is moderate-severe and is interfering with her normal ADLs. Describes pain as constant, worse at the end of the day She has been using heat/ice with some relief. She has been using Tylenol with little relief Associated signs and symptoms: Popping and clicking sensation, feels like her knee will give out.  Denies any locking or catching.  Noticed some knee swelling that is improved over time.  She additionally reports significant previous injuries in this right knee including medial meniscus tear X2, MCL tear X2, and chipping posterior portion of her patella.  All managed conservatively, no operative management as she did not have insurance at that time. Most recent injury was approximately 7 years ago.  Finger knot:  She has felt a knot at the MCP of her index finger for the past several months.  Has not changed in size since onset.  No pain associated unless she is opening a jar.  Denies any overlying skin erythema/bruising, swelling, locking of her finger, or weakness in that hand.  Does not bother her much, however wanted to have it looked at.  ROS: No unexpected weight loss, fever, chills, muscle pain, numbness/tingling, redness, otherwise see HPI   PMHx - Updated and reviewed.  Contributory factors include: Negative PSHx - Updated and reviewed.  Contributory factors include:  Negative FHx - Updated and reviewed.  Contributory factors include:  Negative Social Hx - Updated  and reviewed. Contributory factors include: Negative Medications - reviewed   PHYSICAL EXAM:  VS: BP:122/84  HR: bpm  TEMP: ( )  RESP:   HT:5\' 5"  (165.1 cm)   WT:184 lb 4.8 oz (83.6 kg)  BMI:30.67  PHYSICAL EXAM: Gen: NAD, alert, cooperative with exam, well-appearing HEENT: clear conjunctiva CV: normal rate Resp: non-labored Skin: no rashes, normal turgor  Psych:  alert and oriented  MSK: Right Knee: - Inspection: no gross deformity. No erythema or bruising. Skin intact - Palpation: Tenderness to palpation of medial and lateral joint line, mild joint effusion palpated within right knee - ROM: full active ROM with flexion and extension in knee and hip - Strength: 5/5 strength - Neuro/vasc: NV intact - Special Tests: - LIGAMENTS: negative anterior and posterior drawer, negative Lachman's, no MCL or LCL laxity  -- MENISCUS: positive McMurray's, positive Thessaly with medial pain on right knee -- PF JOINT: nml patellar mobility bilaterally.  negative patellar grind  Hips: normal ROM, negative FABER bilaterally  ULTRASOUND: Knee, limited, right  Diagnostic limited 1+ effusion noted within the suprapatellar pouch.  - Patellar tendon: No appreciated signs of tearing, edema, or calcification. No infrapatellar or tibial tuberosity fluid or abnormality appreciated.  - Medial joint line: Signs concerning for meniscal pathology appreciated with abnormal meniscal contour, bulging above joint line. Mild increased peri-meniscal fluid presence noted. Evidence of osteophyte development near medial meniscus.   - Lateral joint line: No signs concerning for meniscal pathology appreciated. No increased fluid presence noted.  - MCL: No evidence of integrity loss  or abnormal fluid presence.   Impression: joint effusion with sign of likely degenerative medial meniscal tear and arthritic changes in knee  ASSESSMENT & PLAN:   Right knee pain Acute on chronic, associated with significant  tenderness over medial joint line, small joint effusion, and positive Thessaly's. Suspect that this may be multifactorial secondary to osteoarthritis (with new osteophyte formation on ultrasound) and irritation of degenerative meniscal tear. Minimal improvement despite 3 weeks of conservative measures of ice, anti-inflammatories, and compression. Will give her additional isometric quad strengthening exercises and will provide her a body helix brace to provide better stability and compression. Given several knee injuries in the past with new instability and swelling, will obtain x-ray for evaluation of arthritis and will obtain MRI of knee to evaluate meniscal injury, especially given extensive knee history.  In the interim, instructed her to do avoid aggravating activities as possible, ice, anti-inflammatories as needed. Will have her return to discuss MRI results and next steps.  Cyst of index finger  At MCP joint. Few mm, nonpainful, mobile cystic structure visualized on ultrasound.  Does not appear to be adhered to nearby tendon or ligamentous structures.  Provided reassurance, will monitor.  Darrelyn Hillock, DO Family Medicine PGY-2    I saw and evaluated the patient and reviewed all pertinent medical records myself.  I developed the management plan that is described in note with my own edits above. Marcina Millard, MD [facility] Fellow  I was the preceptor for this visit and available for immediate consultation Carla Cleverly, DO

## 2019-06-21 NOTE — Progress Notes (Deleted)
BELEN WESTMEYER - 38 y.o. female MRN [id number]  Date of birth: 15-Sep-1980  SUBJECTIVE:   CC: Right knee pain  Right knee: Location: Anterior right knee Duration: 3 weeks consistently, however has had off-and-on right knee pain for several years that is similar in quality, seems to be getting worse Quality: Generalized aching with quadricep spasming sensation Severity: Did not scale, however moderate-severe as interfering with her normal ADLs Timing: Constant, worse at the end of the day Context: Recently flared up after going on a 3.5 mile hike 3 weeks ago, denies any abnormal twisting motion or injury at that time Modifying factors: Heat/ice helps some, has been using Tylenol with little relief Associated signs and symptoms: Popping and clicking sensation, feels like her knee will give out.  Denies any locking or catching.  Noticed some knee swelling that is improved over time.  She additionally reports significant previous injuries in this right knee including medial meniscus tear X2, MCL tear X2, and chipping posterior portion of her patella.  All managed conservatively, no operative management as she did not have insurance at that time. Most recent injury was approximately 7 years ago.  Finger knot:  She notes over the past several months a knot at the MCP of her index finger.  Has not changed in size since onset.  No pain associated unless she is opening a jar.  Denies any overlying skin erythema/bruising, swelling, locking of her finger, or weakness in that hand.  Does not bother her much, however wanted to have it looked at.  ROS: No unexpected weight loss, fever, chills, muscle pain, numbness/tingling, redness, otherwise see HPI   PMHx - Updated and reviewed.  Contributory factors include: Negative PSHx - Updated and reviewed.  Contributory factors include:  Negative FHx - Updated and reviewed.  Contributory factors include:  Negative Social Hx - Updated and reviewed. Contributory  factors include: Negative Medications - reviewed   PHYSICAL EXAM:  VS: BP:122/84  HR: bpm  TEMP: ( )  RESP:   HT:5\' 5"  (165.1 cm)   WT:184 lb 4.8 oz (83.6 kg)  BMI:30.67  PHYSICAL EXAM: Gen: NAD, alert, cooperative with exam, well-appearing HEENT: clear conjunctiva CV: normal rate Resp: non-labored Skin: no rashes, normal turgor  Psych:  alert and oriented  MSK: Knee: - Inspection: no gross deformity. No erythema or bruising. Skin intact - Palpation: Tenderness to palpation of medial and lateral joint compartments on right knee, mild joint effusion palpated within right knee - ROM: full active ROM with flexion and extension in knee and hip - Strength: 5/5 strength - Neuro/vasc: NV intact - Special Tests: - LIGAMENTS: negative anterior and posterior drawer, negative Lachman's, no MCL or LCL laxity  -- MENISCUS: negative McMurray's, positive Thessaly with medial pain on right knee -- PF JOINT: nml patellar mobility bilaterally.  negative patellar grind  Hips: normal ROM, negative FABER bilaterally  ULTRASOUND: Knee, limited, right  Diagnostic limited 1+ effusion noted within the suprapatellar pouch.  - Patellar tendon: No appreciated signs of tearing, edema, or calcification. No infrapatellar or tibial tuberosity fluid or abnormality appreciated.  - Medial joint line: Signs concerning for meniscal pathology appreciated with abnormal meniscal contour, bulging above joint line. Mild increased peri-meniscal fluid presence noted. Evidence of osteophyte development near medial meniscus.   - Lateral joint line: No signs concerning for meniscal pathology appreciated. No increased fluid presence noted.  - MCL: No evidence of integrity loss or abnormal fluid presence.   ASSESSMENT & PLAN:   Right knee  pain Acute on chronic, associated with significant tenderness to medial/lateral joint compartments, small joint effusion, and positive Thessaly's. Suspect may be multifactorial  secondary to aggravated osteoarthritis with likely degenerative meniscal injury or new meniscal tear especially given early osteophyte formation, joint effusion, and abnormal medial meniscus visualized on bedside ultrasound and physical exam findings as above.  Reassuringly ROM is preserved without locking or instability. She has already been trialing conservative therapy without improvement for the past several weeks, will add on quadricep strengthening exercises and knee brace for further stability. -Obtain knee XR for initial evaluation -Obtain MRI of the right knee to further assess extent of injury, especially given extensive knee history -Start home knee/quadriceps strengthening exercises -Knee brace, fitted in the office today -Avoid aggravating activities as possible, heat/ice, Tylenol as needed   Cyst of index finger  At MCP joint. Few mm, nonpainful, mobile cystic structure visualized on ultrasound.  Does not appear to be adhered to nearby tendon or ligamentous structures.  Provided reassurance, will monitor.  Patriciaann Clan, DO  Family Medicine PGY-2

## 2019-07-02 ENCOUNTER — Ambulatory Visit: Payer: BC Managed Care – PPO | Admitting: Neurology

## 2019-07-02 ENCOUNTER — Other Ambulatory Visit: Payer: Self-pay

## 2019-07-02 ENCOUNTER — Encounter: Payer: Self-pay | Admitting: Neurology

## 2019-07-02 VITALS — BP 120/86 | HR 63 | Temp 97.5°F | Ht 65.0 in | Wt 186.0 lb

## 2019-07-02 DIAGNOSIS — R519 Headache, unspecified: Secondary | ICD-10-CM | POA: Diagnosis not present

## 2019-07-02 DIAGNOSIS — G43709 Chronic migraine without aura, not intractable, without status migrainosus: Secondary | ICD-10-CM

## 2019-07-02 DIAGNOSIS — H539 Unspecified visual disturbance: Secondary | ICD-10-CM

## 2019-07-02 DIAGNOSIS — G441 Vascular headache, not elsewhere classified: Secondary | ICD-10-CM | POA: Diagnosis not present

## 2019-07-02 DIAGNOSIS — R51 Headache with orthostatic component, not elsewhere classified: Secondary | ICD-10-CM

## 2019-07-02 MED ORDER — ALPRAZOLAM 0.25 MG PO TABS: ORAL_TABLET | ORAL | 0 refills | Status: DC

## 2019-07-02 MED ORDER — AJOVY 225 MG/1.5ML ~~LOC~~ SOAJ: 225.0000 mg | SUBCUTANEOUS | 11 refills | Status: DC

## 2019-07-02 MED ORDER — ONDANSETRON 4 MG PO TBDP: 4.0000 mg | ORAL_TABLET | Freq: Three times a day (TID) | ORAL | 11 refills | Status: DC | PRN

## 2019-07-02 MED ORDER — RIZATRIPTAN BENZOATE 10 MG PO TBDP: 10.0000 mg | ORAL_TABLET | ORAL | 11 refills | Status: DC | PRN

## 2019-07-02 NOTE — Progress Notes (Signed)
[id number]    Provider:  Dr Jaynee Eagles Requesting Provider: Ladell Pier, MD Primary Care Provider:  Ladell Pier, MD  CC:  Migraines  HPI:  Carla Buck is a 38 y.o. female here as requested by Ladell Pier, MD for migraines. PMHx gastric bypass, hysterectomy, asthma, migraines.  She is on verapamil and Maxalt for her migraines.  She tried Imitrex in the past and did not work.  She also has propranolol 10 mg tablets by mouth as needed on her list which is sometimes used for acute management.  Maxalt is 5 mg.  She is on venlafaxine as well which can be used for migraines. She has had migraines for 6 years or longer. She has migraines 3 days a week, 15 headache days a month and 10 migraine days a month which are moderately severe or severe. She has severe migraines, nausea, she has to wear sunglasses, light and sound sensitivity, pulsating/pounding/throbbing. Ongoing at this severity and frequency for over a year. No medication overuse. Riztriptan alone does not help, tylenol helps sometimes. Movement makes it worse.  She wakes up with headaches, positional in quality worse in the morning when laying down, she gets blurry vision with the headaches. Migraines on the left side, starts slowly, can last 24-72 hours untreaed and be severe, no aura and no ,edictaion overuse.  No other focal neurologic deficits, associated symptoms, inciting events or modifiable factors.  Migraine medications tried: Verapamil, propranolol, venlafaxine, Maxalt, Imitrex, zofran  Reviewed notes, labs and imaging from outside physicians, which showed:   I reviewed Dr. Durenda Age notes.  Patient was recently seen for new patient visit.  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  She feels she is doing okay.  Occurs 3 times a week down from 6 migraines a week.  She tried Imitrex oral and nasal and neither  work.  She is lost 62 pounds so far from her Roux-en-Y procedure in March of this year.  Recently seen for UTI in the emergency room, I reviewed those notes, started on a course of nitrofurantoin.  B12 658, CMP, CBC normal 06/18/2019  Review of Systems: Patient complains of symptoms per HPI as well as the following symptoms: headache. Pertinent negatives and positives per HPI. All others negative.   Social History   Socioeconomic History   Marital status: Married    Spouse name: Not on file   Number of children: 3   Years of education: Not on file   Highest education level: Master's degree (e.g., MA, MS, MEng, MEd, MSW, MBA)  Occupational History   Not on file  Social Needs   Financial resource strain: Not on file   Food insecurity    Worry: Not on file    Inability: Not on file   Transportation needs    Medical: Not on file    Non-medical: Not on file  Tobacco Use   Smoking status: Never Smoker   Smokeless tobacco: Never Used  Substance and Sexual Activity   Alcohol use: Not on file    Comment: occasional   Drug use: Never   Sexual activity: Not on file  Lifestyle   Physical activity    Days per week: Not on file    Minutes per session: Not on file   Stress: Not on file  Relationships   Social connections    Talks on phone: Not on file  Gets together: Not on file    Attends religious service: Not on file    Active member of club or organization: Not on file    Attends meetings of clubs or organizations: Not on file    Relationship status: Not on file   Intimate partner violence    Fear of current or ex partner: Not on file    Emotionally abused: Not on file    Physically abused: Not on file    Forced sexual activity: Not on file  Other Topics Concern   Not on file  Social History Narrative   Lives at home with her children    Right handed   Caffeine: 2-3 cups/day    Family History  Problem Relation Age of Onset   Hypertension Mother      Depression Father    Bipolar disorder Sister    Hypertension Maternal Grandmother    Hypertension Paternal Grandmother    Diabetes Paternal Grandmother    Hypertension Paternal Grandfather    Migraines Neg Hx     Past Medical History:  Diagnosis Date   Allergy    Anxiety    Asthma    Depression    Migraine     Patient Active Problem List   Diagnosis Date Noted   Chronic migraine without aura without status migrainosus, not intractable 07/02/2019   Right knee pain 06/21/2019   History of migraine 06/19/2019   Moderate persistent asthma 06/19/2019   Environmental and seasonal allergies 06/19/2019   Ganglion cyst of finger 06/19/2019   Eczema 06/19/2019   Obesity (BMI 30-39.9) 06/19/2019   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     albuterol (VENTOLIN HFA) 108 (90 Base) MCG/ACT inhaler Inhale 2 puffs into the lungs every 6 (six) hours as needed for wheezing or shortness of breath.     calcium carbonate (OSCAL) 1500 (600 Ca) MG TABS tablet Take 1,500 mg by mouth daily with breakfast.     diphenhydrAMINE (BENADRYL) 25 mg capsule Take 25 mg by mouth every 6 (six) hours as needed.     EPINEPHrine (EPIPEN 2-PAK) 0.3 mg/0.3 mL IJ SOAJ injection Inject 0.3 mg into the muscle as needed for anaphylaxis.     ergocalciferol (VITAMIN D2) 1.25 MG (50000 UT) capsule Take 50,000 Units by mouth once a week.     ferrous sulfate 325 (65 FE) MG tablet Take 325 mg by mouth daily with breakfast.     fluticasone furoate-vilanterol (BREO  ELLIPTA) 200-25 MCG/INH AEPB Inhale 1 puff into the lungs daily.     ipratropium-albuterol (DUONEB) 0.5-2.5 (3) MG/3ML SOLN Take 3 mLs by nebulization every 6 (six) hours as needed.     levocetirizine (XYZAL) 5 MG tablet Take 5 mg by mouth every evening.     montelukast (SINGULAIR) 10 MG tablet Take 10 mg by mouth at bedtime.     Multiple Vitamins-Minerals (BARIATRIC MULTIVITAMINS/IRON PO) Take 1 tablet by mouth.     predniSONE (DELTASONE) 20 MG tablet Take 20 mg by mouth daily as needed (Unspecified).  propranolol (INDERAL) 10 MG tablet Take 10 mg by mouth as needed.     tamoxifen (NOLVADEX) 20 MG tablet Take 50 mg by mouth daily.      traZODone (DESYREL) 150 MG tablet Take 150 mg by mouth at bedtime as needed for sleep.     triamcinolone ointment (KENALOG) 0.1 % Apply 1 application topically 2 (two) times daily.     venlafaxine (EFFEXOR) 100 MG tablet Take 100 mg by mouth 2 (two) times daily.     verapamil (CALAN) 80 MG tablet Take 80 mg by mouth 3 (three) times daily.      Fremanezumab-vfrm (AJOVY) 225 MG/1.5ML SOAJ Inject 225 mg into the skin every 30 (thirty) days. 1 pen 11   ondansetron (ZOFRAN-ODT) 4 MG disintegrating tablet Take 1-2 tablets (4-8 mg total) by mouth every 8 (eight) hours as needed for nausea. 30 tablet 11   rizatriptan (MAXALT-MLT) 10 MG disintegrating tablet Take 1 tablet (10 mg total) by mouth as needed for migraine. May repeat in 2 hours if needed 9 tablet 11   No current facility-administered medications for this visit.     Allergies as of 07/02/2019 - Review Complete 07/02/2019  Allergen Reaction Noted   Bee venom Anaphylaxis 06/18/2019   Peanut-containing drug products Anaphylaxis 05/14/2019   Pollen extract Shortness Of Breath 06/18/2019   Shellfish allergy Anaphylaxis 05/14/2019   Wasp venom Anaphylaxis 06/18/2019   Xolair [omalizumab] Anaphylaxis 05/14/2019   Amoxicillin Hives 06/18/2019   Bug itch releaf [misc natural products]  Hives 06/18/2019   Contrave [naltrexone-bupropion hcl er] Hives 06/18/2019   Corn-containing products  06/18/2019   Dust mite extract  06/19/2019   Gluten meal  05/14/2019   Iodine Hives 05/14/2019   Lidocaine Hives 05/14/2019   Orange fruit [citrus] Hives 05/14/2019   Penicillins Hives 05/14/2019   Sesame oil Diarrhea 06/18/2019   Soy allergy Hives 05/14/2019   Tetracaine Hives 06/18/2019   Tomato Hives 05/14/2019    Vitals: BP 120/86 (BP Location: Right Arm, Patient Position: Sitting)    Pulse 63    Temp (!) 97.5 F (36.4 C) Comment: taken at front door   Ht 5\' 5"  (1.651 m)    Wt 186 lb (84.4 kg)    BMI 30.95 kg/m  Last Weight:  Wt Readings from Last 1 Encounters:  07/02/19 186 lb (84.4 kg)   Last Height:   Ht Readings from Last 1 Encounters:  07/02/19 5\' 5"  (1.651 m)     Physical exam: Exam: Gen: NAD, conversant, well nourised, overweight,  well groomed                     CV: RRR, no MRG. No Carotid Bruits. No peripheral edema, warm, nontender Eyes: Conjunctivae clear without exudates or hemorrhage  Neuro: Detailed Neurologic Exam  Speech:    Speech is normal; fluent and spontaneous with normal comprehension.  Cognition:    The patient is oriented to person, place, and time;     recent and remote memory intact;     language fluent;     normal attention, concentration,     fund of knowledge Cranial Nerves:    The pupils are equal, round, and reactive to light. The fundi are normal and spontaneous venous pulsations are present. Visual fields are full to finger confrontation. Extraocular movements are intact. Trigeminal sensation is intact and the muscles of mastication are normal. The face is symmetric. The palate elevates in the midline. Hearing intact. Voice is normal. Shoulder shrug is  normal. The tongue has normal motion without fasciculations.   Coordination:    Normal finger to nose and heel to shin. Normal rapid alternating movements.   Gait:     Heel-toe and tandem gait are normal.   Motor Observation:    No asymmetry, no atrophy, and no involuntary movements noted. Tone:    Normal muscle tone.    Posture:    Posture is normal. normal erect    Strength:    Strength is V/V in the upper and lower limbs.      Sensation: intact to LT     Reflex Exam:  DTR's:    Deep tendon reflexes in the upper and lower extremities are normal bilaterally.   Toes:    The toes are downgoing bilaterally.   Clonus:    Clonus is absent.    Assessment/Plan:  Patient with chronic migraines.However given concerning symptoms need thorough evaluation.   Failed multiple medication classes. Discussed, start Ajovy. She had a reaction to Xolair, we discussed risks, she has epi pen advised to take with someone present.  Acute: maxalt and zofran MRI brain due to concerning symptoms of morning headaches, positional headaches,vision changes  to look for space occupying mass, chiari or intracranial hypertension (pseudotumor).   Orders Placed This Encounter  Procedures   MR BRAIN W WO CONTRAST   Meds ordered this encounter  Medications   rizatriptan (MAXALT-MLT) 10 MG disintegrating tablet    Sig: Take 1 tablet (10 mg total) by mouth as needed for migraine. May repeat in 2 hours if needed    Dispense:  9 tablet    Refill:  11   ondansetron (ZOFRAN-ODT) 4 MG disintegrating tablet    Sig: Take 1-2 tablets (4-8 mg total) by mouth every 8 (eight) hours as needed for nausea.    Dispense:  30 tablet    Refill:  11   Fremanezumab-vfrm (AJOVY) 225 MG/1.5ML SOAJ    Sig: Inject 225 mg into the skin every 30 (thirty) days.    Dispense:  1 pen    Refill:  11    Cc: Ladell Pier, MD,  Ladell Pier, MD  Sarina Ill, MD  [facility] [address] [place], [place] [address]  Phone [phone] Fax [phone]

## 2019-07-02 NOTE — Patient Instructions (Addendum)
Start Ajovy Maxalt 10mg  at onset and repeat in 2 hours if needed Zofran 4-8mg  as needed  Fremanezumab injection What is this medicine? FREMANEZUMAB (fre ma NEZ ue mab) is used to prevent migraine headaches. This medicine may be used for other purposes; ask your health care provider or pharmacist if you have questions. COMMON BRAND NAME(S): AJOVY What should I tell my health care provider before I take this medicine? They need to know if you have any of these conditions:  an unusual or allergic reaction to fremanezumab, other medicines, foods, dyes, or preservatives  pregnant or trying to get pregnant  breast-feeding How should I use this medicine? This medicine is for injection under the skin. You will be taught how to prepare and give this medicine. Use exactly as directed. Take your medicine at regular intervals. Do not take your medicine more often than directed. It is important that you put your used needles and syringes in a special sharps container. Do not put them in a trash can. If you do not have a sharps container, call your pharmacist or healthcare provider to get one. Talk to your pediatrician regarding the use of this medicine in children. Special care may be needed. Overdosage: If you think you have taken too much of this medicine contact a poison control center or emergency room at once. NOTE: This medicine is only for you. Do not share this medicine with others. What if I miss a dose? If you miss a dose, take it as soon as you can. If it is almost time for your next dose, take only that dose. Do not take double or extra doses. What may interact with this medicine? Interactions are not expected. This list may not describe all possible interactions. Give your health care provider a list of all the medicines, herbs, non-prescription drugs, or dietary supplements you use. Also tell them if you smoke, drink alcohol, or use illegal drugs. Some items may interact with your  medicine. What should I watch for while using this medicine? Tell your doctor or healthcare professional if your symptoms do not start to get better or if they get worse. What side effects may I notice from receiving this medicine? Side effects that you should report to your doctor or health care professional as soon as possible:  allergic reactions like skin rash, itching or hives, swelling of the face, lips, or tongue Side effects that usually do not require medical attention (report these to your doctor or health care professional if they continue or are bothersome):  pain, redness, or irritation at site where injected This list may not describe all possible side effects. Call your doctor for medical advice about side effects. You may report side effects to FDA at 1-800-FDA-1088. Where should I keep my medicine? Keep out of the reach of children. You will be instructed on how to store this medicine. Throw away any unused medicine after the expiration date on the label. NOTE: This sheet is a summary. It may not cover all possible information. If you have questions about this medicine, talk to your doctor, pharmacist, or health care provider.  2020 Elsevier/Gold Standard (2017-05-02 17:22:56)

## 2019-07-02 NOTE — Addendum Note (Signed)
Addended by: Sarina Ill B on: 07/02/2019 12:44 PM   Modules accepted: Orders

## 2019-07-09 ENCOUNTER — Ambulatory Visit: Payer: Self-pay | Admitting: Allergy and Immunology

## 2019-07-09 ENCOUNTER — Other Ambulatory Visit: Payer: Self-pay

## 2019-07-09 ENCOUNTER — Ambulatory Visit
Admission: RE | Admit: 2019-07-09 | Discharge: 2019-07-09 | Disposition: A | Discharge status: Home / Self Care | Payer: BC Managed Care – PPO | Source: Ambulatory Visit | Attending: Sports Medicine | Admitting: Sports Medicine

## 2019-07-09 DIAGNOSIS — M25561 Pain in right knee: Secondary | ICD-10-CM

## 2019-07-10 ENCOUNTER — Other Ambulatory Visit: Payer: Self-pay

## 2019-07-10 ENCOUNTER — Telehealth (INDEPENDENT_AMBULATORY_CARE_PROVIDER_SITE_OTHER): Payer: BC Managed Care – PPO | Admitting: Sports Medicine

## 2019-07-10 ENCOUNTER — Ambulatory Visit (INDEPENDENT_AMBULATORY_CARE_PROVIDER_SITE_OTHER): Payer: BC Managed Care – PPO

## 2019-07-10 DIAGNOSIS — G441 Vascular headache, not elsewhere classified: Secondary | ICD-10-CM

## 2019-07-10 DIAGNOSIS — H539 Unspecified visual disturbance: Secondary | ICD-10-CM

## 2019-07-10 DIAGNOSIS — R519 Headache, unspecified: Secondary | ICD-10-CM | POA: Diagnosis not present

## 2019-07-10 DIAGNOSIS — R51 Headache with orthostatic component, not elsewhere classified: Secondary | ICD-10-CM

## 2019-07-10 DIAGNOSIS — M25561 Pain in right knee: Secondary | ICD-10-CM

## 2019-07-10 MED ORDER — GADOBENATE DIMEGLUMINE 529 MG/ML IV SOLN
15.0000 mL | Freq: Once | INTRAVENOUS | Status: AC | PRN
  Administered 2019-07-10: 15 mL via INTRAVENOUS

## 2019-07-10 NOTE — Progress Notes (Addendum)
  Carla Buck - 38 y.o. female MRN [id number]  Date of birth: Nov 29, 1980   Virtual Visit via Telephone Note  I connected with Carla Buck on 07/10/19 at  3:30 PM EST by telephone and verified that I am speaking with the correct person using two identifiers. Location of patient/parent: home   I discussed the limitations, risks, security and privacy concerns of performing an evaluation and management service by telephone and the availability of in person appointments. I discussed that the purpose of this phone visit is to provide medical care while limiting exposure to the novel coronavirus.  I also discussed with the patient that there may be a patient responsible charge related to this service. The patient expressed understanding and agreed to proceed.  Reason for visit:  MRI results  HPI: Carla Buck is a 38 yo female presenting with anterior knee pain for 5 weeks after an injury obtained while walking. She had a MRI yesterday. I told her that her MRI showed mild degeneration of medial meniscus, mild patellofemoral and medial compartment OA. Discussed that although arthritis is not curable, pain can improve with rehab exercises and we can give her a steroid injection to settle the pain in the interim. She was pleased that she will not require surgery. She reports that pain has improved slightly but she is still having pain when she tries to squat at all.   PMHx - Updated and reviewed.  Contributory factors include: Negative PSHx - Updated and reviewed.  Contributory factors include:  Negative FHx - Updated and reviewed.  Contributory factors include:  Negative Social Hx - Updated and reviewed. Contributory factors include: Negative Medications - reviewed     Assessment and Plan: Reviewed isometric quad exercises. Discussed various options and she would like to do corticosteroid injection of knee. She is scheduled for Thursday, December 3rd.     I spent 15 minutes of non-face-to-face time on  this telephone visit.    I was located at clinic during this encounter.  Jerolyn Shin, MD

## 2019-07-18 ENCOUNTER — Encounter: Payer: Self-pay | Admitting: Internal Medicine

## 2019-07-18 DIAGNOSIS — N390 Urinary tract infection, site not specified: Secondary | ICD-10-CM

## 2019-07-19 ENCOUNTER — Ambulatory Visit: Payer: BC Managed Care – PPO | Admitting: Pediatrics

## 2019-07-19 ENCOUNTER — Emergency Department (HOSPITAL_COMMUNITY)
Admission: EM | Admit: 2019-07-19 | Discharge: 2019-07-19 | Disposition: A | Discharge status: Home / Self Care | Payer: BC Managed Care – PPO | Attending: Emergency Medicine | Admitting: Emergency Medicine

## 2019-07-19 ENCOUNTER — Encounter (HOSPITAL_COMMUNITY): Payer: Self-pay

## 2019-07-19 ENCOUNTER — Emergency Department (HOSPITAL_COMMUNITY): Payer: BC Managed Care – PPO

## 2019-07-19 ENCOUNTER — Other Ambulatory Visit: Payer: Self-pay

## 2019-07-19 DIAGNOSIS — J4541 Moderate persistent asthma with (acute) exacerbation: Secondary | ICD-10-CM

## 2019-07-19 DIAGNOSIS — Z20828 Contact with and (suspected) exposure to other viral communicable diseases: Secondary | ICD-10-CM | POA: Diagnosis not present

## 2019-07-19 DIAGNOSIS — Z91010 Allergy to peanuts: Secondary | ICD-10-CM | POA: Diagnosis not present

## 2019-07-19 DIAGNOSIS — R0602 Shortness of breath: Secondary | ICD-10-CM | POA: Diagnosis present

## 2019-07-19 DIAGNOSIS — Z79899 Other long term (current) drug therapy: Secondary | ICD-10-CM | POA: Insufficient documentation

## 2019-07-19 LAB — POC SARS CORONAVIRUS 2 AG -  ED: SARS Coronavirus 2 Ag: NEGATIVE

## 2019-07-19 MED ORDER — SODIUM CHLORIDE 0.9 % IV BOLUS
1000.0000 mL | Freq: Once | INTRAVENOUS | Status: AC
  Administered 2019-07-19: 1000 mL via INTRAVENOUS

## 2019-07-19 MED ORDER — MAGNESIUM SULFATE 2 GM/50ML IV SOLN: 2.0000 g | Freq: Once | INTRAVENOUS | Status: DC

## 2019-07-19 MED ORDER — IPRATROPIUM-ALBUTEROL 0.5-2.5 (3) MG/3ML IN SOLN
3.0000 mL | Freq: Once | RESPIRATORY_TRACT | Status: AC
  Administered 2019-07-19: 3 mL via RESPIRATORY_TRACT
  Filled 2019-07-19: qty 3

## 2019-07-19 MED ORDER — PREDNISONE 10 MG PO TABS: 40.0000 mg | ORAL_TABLET | Freq: Every day | ORAL | 0 refills | Status: AC

## 2019-07-19 MED ORDER — ONDANSETRON HCL 4 MG/2ML IJ SOLN
4.0000 mg | Freq: Once | INTRAMUSCULAR | Status: AC
  Administered 2019-07-19: 4 mg via INTRAVENOUS
  Filled 2019-07-19: qty 2

## 2019-07-19 MED ORDER — METHYLPREDNISOLONE SODIUM SUCC 125 MG IJ SOLR
125.0000 mg | Freq: Once | INTRAMUSCULAR | Status: AC
  Administered 2019-07-19: 125 mg via INTRAVENOUS
  Filled 2019-07-19: qty 2

## 2019-07-19 MED ORDER — ONDANSETRON 4 MG PO TBDP: 4.0000 mg | ORAL_TABLET | Freq: Three times a day (TID) | ORAL | 0 refills | Status: DC | PRN

## 2019-07-19 NOTE — ED Provider Notes (Signed)
[facility] Provider Note   CSN: [id number] Arrival date & time: 07/19/19  1400     History   Chief Complaint Chief Complaint  Patient presents with   Shortness of Breath   Asthma    HPI Carla Buck is a 38 y.o. female with a past medical history of asthma, eczema, allergies presenting to the ED with a chief complaint of shortness of breath and wheezing.  This morning woke up and feeling like she was having a "asthma attack."  Reports wheezing, shortness of breath and lightheadedness.  She took several puffs of her albuterol inhaler, her daily inhaler Breo, antihistamines and DuoNeb with only mild improvement in her symptoms.  States that she still feels like her "chest is tight, I cannot get a good breath in."  States this is happened to her in the past when she has an asthma attack.  States that she has felt dizzy which is unusual for her during her asthma attacks.  Reports left-sided chest pain which is also typical of her asthma attacks.  She denies history of PE or MI.  Denies supplemental oxygen use at baseline.  Denies cough, fever, sick contacts with similar symptoms, hemoptysis, leg swelling.     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      Home Medications    Prior to Admission medications   Medication Sig Start Date End Date Taking? Authorizing Provider  acetaminophen (TYLENOL) 325 MG tablet Take 325 mg by mouth every 6 (six) hours as needed.    [provider]  albuterol (VENTOLIN HFA) 108 (90 Base) MCG/ACT inhaler Inhale 2 puffs into the lungs every 6 (six) hours as needed for wheezing or shortness of breath.    [provider]  ALPRAZolam (XANAX) 0.25 MG tablet Take 1-2 tabs (0.25mg -0.50mg ) 30-60 minutes before procedure. May repeat if needed.Do not drive. 07/02/19   Melvenia Beam, MD  calcium carbonate (OSCAL) 1500 (600 Ca) MG TABS tablet Take 1,500 mg by mouth daily with breakfast.    [provider]  diphenhydrAMINE (BENADRYL) 25 mg capsule Take 25 mg by mouth every 6 (six) hours as needed.    [provider]  EPINEPHrine (EPIPEN 2-PAK) 0.3 mg/0.3 mL IJ SOAJ injection Inject 0.3 mg into the muscle as needed for anaphylaxis.    [provider]  ergocalciferol (VITAMIN D2) 1.25 MG (50000 UT) capsule Take 50,000 Units by mouth once a week.    [provider]  ferrous sulfate 325 (65 FE) MG tablet Take 325 mg by  mouth daily with breakfast.    [provider]  fluticasone furoate-vilanterol (BREO ELLIPTA) 200-25 MCG/INH AEPB Inhale 1 puff into the lungs daily.    [provider]  Fremanezumab-vfrm (AJOVY) 225 MG/1.5ML SOAJ Inject 225 mg into the skin every 30 (thirty) days. 07/02/19   Melvenia Beam, MD  ipratropium-albuterol (DUONEB) 0.5-2.5 (3) MG/3ML SOLN Take 3 mLs by nebulization every 6 (six) hours as needed.    [provider]  levocetirizine (XYZAL) 5 MG tablet Take 5 mg by mouth every evening.    [provider]  montelukast (SINGULAIR) 10 MG tablet Take 10 mg by mouth at bedtime.     [provider]  Multiple Vitamins-Minerals (BARIATRIC MULTIVITAMINS/IRON PO) Take 1 tablet by mouth.    [provider]  ondansetron (ZOFRAN ODT) 4 MG disintegrating tablet Take 1 tablet (4 mg total) by mouth every 8 (eight) hours as needed for nausea or vomiting. 07/19/19   Merina Behrendt, PA-C  predniSONE (DELTASONE) 10 MG tablet Take 4 tablets (40 mg total) by mouth daily for 5 days. 07/19/19 07/24/19  Tyyonna Soucy, PA-C  propranolol (INDERAL) 10 MG tablet Take 10 mg by mouth as needed.    [provider]  rizatriptan (MAXALT-MLT) 10 MG disintegrating tablet Take 1 tablet (10 mg total) by mouth as needed for migraine. May repeat in 2 hours if needed 07/02/19   Melvenia Beam, MD  tamoxifen (NOLVADEX) 20 MG tablet Take 50 mg by mouth daily.     [provider]  traZODone (DESYREL) 150 MG tablet Take 150 mg by mouth at bedtime as needed for sleep.    [provider]  triamcinolone ointment (KENALOG) 0.1 % Apply 1 application topically 2 (two) times daily.    [provider]  venlafaxine (EFFEXOR) 100 MG tablet Take 100 mg by mouth 2 (two) times daily.    [provider]  verapamil (CALAN) 80 MG tablet Take 80 mg by mouth 3 (three) times daily.  08/07/18   [provider]    Family History Family History  Problem Relation Age of Onset   Hypertension Mother    Depression Father    Bipolar disorder Sister    Hypertension Maternal Grandmother    Hypertension Paternal Grandmother    Diabetes Paternal Grandmother    Hypertension Paternal Grandfather    Migraines Neg Hx     Social History Social History   Tobacco Use   Smoking status: Never Smoker   Smokeless tobacco: Never Used  Substance Use Topics   Alcohol use: Not on file    Comment: occasional   Drug use: Never     Allergies   Bee venom, Peanut-containing drug products, Pollen extract, Shellfish allergy, Wasp venom, Xolair [omalizumab],  Amoxicillin, Bug itch releaf [misc natural products], Contrave [naltrexone-bupropion hcl er], Corn-containing products, Dust mite extract, Gluten meal, Iodine, Lidocaine, Orange fruit [citrus], Penicillins, Sesame oil, Soy allergy, Tetracaine, and Tomato   Review of Systems Review of Systems  Constitutional: Negative for appetite change, chills and fever.  HENT: Negative for ear pain, rhinorrhea, sneezing and sore throat.   Eyes: Negative for photophobia and visual disturbance.  Respiratory: Positive for chest tightness, shortness of breath and wheezing. Negative for cough.   Cardiovascular: Negative for chest pain and palpitations.  Gastrointestinal: Negative for abdominal pain, blood in stool, constipation, diarrhea, nausea and vomiting.  Genitourinary: Negative for dysuria, hematuria and urgency.  Musculoskeletal: Negative for myalgias.  Skin: Negative for rash.  Neurological: Negative for dizziness, weakness  and light-headedness.     Physical Exam Updated Vital Signs BP 123/83    Pulse 71    Temp 97.9 F (36.6 C) (Oral)    Resp 15    Ht 5\' 5"  (1.651 m)    Wt 81.2 kg    SpO2 100%    BMI 29.79 kg/m   Physical Exam Vitals signs and nursing note reviewed.  Constitutional:      General: She is not in acute distress.    Appearance: She is well-developed.  HENT:     Head: Normocephalic and atraumatic.     Nose: Nose normal.  Eyes:     General: No scleral icterus.       Left eye: No discharge.     Conjunctiva/sclera: Conjunctivae normal.  Neck:     Musculoskeletal: Normal range of motion and neck supple.  Cardiovascular:     Rate and Rhythm: Normal rate and regular rhythm.     Heart sounds: Normal heart sounds. No murmur. No friction rub. No gallop.   Pulmonary:     Effort: Pulmonary effort is normal. No respiratory distress.     Breath sounds: Examination of the right-lower field reveals wheezing. Examination of the left-lower field reveals wheezing. Wheezing present.    Abdominal:     General: Bowel sounds are normal. There is no distension.     Palpations: Abdomen is soft.     Tenderness: There is no abdominal tenderness. There is no guarding.  Musculoskeletal: Normal range of motion.  Skin:    General: Skin is warm and dry.     Findings: No rash.  Neurological:     Mental Status: She is alert.     Motor: No abnormal muscle tone.     Coordination: Coordination normal.      ED Treatments / Results  Labs (all labs ordered are listed, but only abnormal results are displayed) Labs Reviewed  POC SARS CORONAVIRUS 2 AG -  ED    EKG None  Radiology Dg Chest Portable 1 View  Result Date: 07/19/2019 CLINICAL DATA:  Chest pain, shortness of breath. EXAM: PORTABLE CHEST 1 VIEW COMPARISON:  October 12, 2009. FINDINGS: The heart size and mediastinal contours are within normal limits. Both lungs are clear. No pneumothorax or pleural effusion is noted. The visualized skeletal structures are unremarkable. IMPRESSION: No active disease. Electronically Signed   By: Marijo Conception M.D.   On: 07/19/2019 15:20    Procedures Procedures (including critical care time)  Medications Ordered in ED Medications  sodium chloride 0.9 % bolus 1,000 mL (1,000 mLs Intravenous New Bag/Given 07/19/19 1524)  ondansetron (ZOFRAN) injection 4 mg (4 mg Intravenous Given 07/19/19 1524)  methylPREDNISolone sodium succinate (SOLU-MEDROL) 125 mg/2 mL injection 125 mg (125 mg Intravenous Given 07/19/19 1524)  ipratropium-albuterol (DUONEB) 0.5-2.5 (3) MG/3ML nebulizer solution 3 mL (3 mLs Nebulization Given 07/19/19 1523)     Initial Impression / Assessment and Plan / ED Course  I have reviewed the triage vital signs and the nursing notes.  Pertinent labs & imaging results that were available during my care of the patient were reviewed by me and considered in my medical decision making (see chart for details).  Clinical Course as of Jul 18 1657  Thu Jul 19, 2019  1619 Patient  with significant improvement in her shortness of breath, wheezing, nausea.  Does report some ongoing dizziness so we will continue to monitor with IV fluids.   [HK]    Clinical Course User Index [HK]  Delia Heady, PA-C       Carla Buck was evaluated in Emergency Department on 07/19/19  for the symptoms described in the history of present illness. He/she was evaluated in the context of the global COVID-19 pandemic, which necessitated consideration that the patient might be at risk for infection with the SARS-CoV-2 virus that causes COVID-19. Institutional protocols and algorithms that pertain to the evaluation of patients at risk for COVID-19 are in a state of rapid change based on information released by regulatory bodies including the CDC and federal and state organizations. These policies and algorithms were followed during the patient's care in the ED.  38 year old female with a past medical history of asthma, eczema, allergies presenting to the ED with a chief complaint of shortness of breath and wheezing.  When she woke up this morning felt like her usual asthma attacks with the exception of feeling dizzy.  Minimal improvement noted with her home breathing treatments, inhalers and medications.  Reports left-sided chest tightness which she states is typical of her asthma attacks.  On exam patient appears anxious, speaking complete sentences but slightly tachypneic.  Wheezing noted in bilateral lung bases.  Oxygen saturations 100% on room air.  Chest x-ray is unremarkable.  POC Covid test is negative and we were able to give patient a DuoNeb along with Solu-Medrol, IV fluids and Zofran.  There are no deficits neurological exam that concerning for emergent cause of her dizziness, feel this is more so related to her breathing.  EKG shows normal sinus rhythm, no ischemic changes.  She had a negative Covid test earlier this week.  She is afebrile here.  Patient with significant improvement in her  shortness of breath, wheezing, chest tightness and dizziness with the medications given.  Lungs are now clear with auscultation.  Her vital signs remained reassuring.  Patient requesting discharge home.  I doubt this is due to ACS, PE or other emergent causes she is low risk for MI and low risk by Wells criteria for PE.  We will have her take remainder of steroid course, as needed Zofran continue her home medications as previously prescribed.  Patient is hemodynamically stable, in NAD, and able to ambulate in the ED. Evaluation does not show pathology that would require ongoing emergent intervention or inpatient treatment. I explained the diagnosis to the patient. Pain has been managed and has no complaints prior to discharge. Patient is comfortable with above plan and is stable for discharge at this time. All questions were answered prior to disposition. Strict return precautions for returning to the ED were discussed. Encouraged follow up with PCP.   An After Visit Summary was printed and given to the patient.   Portions of this note were generated with [employer]. Dictation errors may occur despite best attempts at proofreading.   Final Clinical Impressions(s) / ED Diagnoses   Final diagnoses:  Moderate persistent asthma with exacerbation    ED Discharge Orders         Ordered    predniSONE (DELTASONE) 10 MG tablet  Daily     07/19/19 1655    ondansetron (ZOFRAN ODT) 4 MG disintegrating tablet  Every 8 hours PRN     07/19/19 1655           Delia Heady, PA-C 07/19/19 1658    Pattricia Boss, MD 07/20/19 [phone]

## 2019-07-19 NOTE — ED Triage Notes (Addendum)
Pt hx asthma, states medicines are not working; pt feeling sob since waking this morning around 0600; used rescue inhaler, 2 puffs x 4 and neb once (duoneb); began feeling dizzy; pt states she had covid test week got negative results this past Saturday; c/o L sided CP with inspiration 5/10; denies sick contacts

## 2019-07-19 NOTE — Discharge Instructions (Signed)
Please continue your home medications as previously prescribed. Take the steroids as directed to help with your asthma exacerbation.  Please complete the entire course of the steroids beginning tomorrow. Follow-up with your primary care provider. Return to the ED if you start to have worsening symptoms, increased chest tightness, shortness of breath, chest pain, leg swelling, numbness in arms or legs.

## 2019-07-19 NOTE — ED Notes (Signed)
Patient verbalizes understanding of discharge instructions. Opportunity for questioning and answers were provided. Armband removed by staff, pt discharged from ED.  

## 2019-07-20 ENCOUNTER — Encounter: Payer: Self-pay | Admitting: Internal Medicine

## 2019-07-26 ENCOUNTER — Other Ambulatory Visit: Payer: Self-pay

## 2019-07-26 ENCOUNTER — Ambulatory Visit (INDEPENDENT_AMBULATORY_CARE_PROVIDER_SITE_OTHER): Payer: BC Managed Care – PPO | Admitting: Pediatrics

## 2019-07-26 VITALS — BP 110/80 | Ht 65.0 in | Wt 178.8 lb

## 2019-07-26 DIAGNOSIS — M25561 Pain in right knee: Secondary | ICD-10-CM

## 2019-07-26 MED ORDER — METHYLPREDNISOLONE ACETATE 40 MG/ML IJ SUSP
40.0000 mg | Freq: Once | INTRAMUSCULAR | Status: AC
  Administered 2019-07-26: 40 mg via INTRA_ARTICULAR

## 2019-07-26 NOTE — Progress Notes (Signed)
  Carla Buck - 38 y.o. female MRN [id number]  Date of birth: 12/27/1980  SUBJECTIVE:   CC: right knee pain f/u  Anterior knee pain x 6 weeks after injury while walking. MRI showed mild meniscal degeneration and PTF, medial compartment OA. She reports that she has intermittent pain-- some days she walks with a limp and some days she is better. She is using the knee brace. Pain with bending knee. Would like an injection today.  ROS: No instability, muscle pain, numbness/tingling, redness, otherwise see HPI   PMHx - Updated and reviewed.  Contributory factors include: Negative PSHx - Updated and reviewed.  Contributory factors include:  Negative FHx - Updated and reviewed.  Contributory factors include:  Negative Social Hx - Updated and reviewed. Contributory factors include: Negative Medications - reviewed   DATA REVIEWED: Prior records  PHYSICAL EXAM:  VS: BP:110/80  HR: bpm  TEMP: ( )  RESP:   HT:5\' 5"  (165.1 cm)   WT:178 lb 12.8 oz (81.1 kg)  BMI:29.75 PHYSICAL EXAM: Gen: NAD, alert, cooperative with exam, well-appearing HEENT: clear conjunctiva,  CV:  no edema, capillary refill brisk, normal rate Resp: non-labored Skin: no rashes, normal turgor  Neuro: no gross deficits.  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 Elected for corticosteroid injection today. Instructed her to continue isometric quad exercises. Use brace as needed.  Procedure performed: knee intraarticular corticosteroid injection; palpation guided  Consent obtained and verified. Time-out conducted. Noted no overlying erythema, induration, or other signs of local infection. The suprapatellar lateral  joint space was palpated and marked. The overlying skin was prepped in a sterile fashion. Topical analgesic spray: Ethyl chloride. Joint: right knee Needle: 25 gauge, 1.5 inch Completed without difficulty. Meds: 3 cc 1% lidocaine w/o epi, 40 mg depo-medrol  Improvement in pain after injection. Advised to call if fevers/chills, erythema, induration, drainage, or persistent bleeding.  Return in 4 weeks for reassesment.  I was the preceptor for this visit and available for immediate consultation Shellia Cleverly, DO

## 2019-07-30 ENCOUNTER — Encounter: Payer: Self-pay | Admitting: Allergy and Immunology

## 2019-07-30 ENCOUNTER — Other Ambulatory Visit: Payer: Self-pay | Admitting: Neurology

## 2019-07-30 ENCOUNTER — Other Ambulatory Visit: Payer: Self-pay

## 2019-07-30 ENCOUNTER — Encounter (HOSPITAL_COMMUNITY): Payer: Self-pay | Admitting: Emergency Medicine

## 2019-07-30 ENCOUNTER — Ambulatory Visit (INDEPENDENT_AMBULATORY_CARE_PROVIDER_SITE_OTHER): Payer: BC Managed Care – PPO | Admitting: Allergy and Immunology

## 2019-07-30 ENCOUNTER — Emergency Department (HOSPITAL_COMMUNITY)
Admission: EM | Admit: 2019-07-30 | Discharge: 2019-07-30 | Disposition: A | Discharge status: Home / Self Care | Payer: BC Managed Care – PPO | Attending: Emergency Medicine | Admitting: Emergency Medicine

## 2019-07-30 VITALS — BP 122/84 | HR 83 | Temp 97.9°F | Resp 18 | Ht 64.0 in | Wt 182.6 lb

## 2019-07-30 DIAGNOSIS — J3089 Other allergic rhinitis: Secondary | ICD-10-CM

## 2019-07-30 DIAGNOSIS — Z79899 Other long term (current) drug therapy: Secondary | ICD-10-CM | POA: Insufficient documentation

## 2019-07-30 DIAGNOSIS — J45909 Unspecified asthma, uncomplicated: Secondary | ICD-10-CM | POA: Diagnosis not present

## 2019-07-30 DIAGNOSIS — L309 Dermatitis, unspecified: Secondary | ICD-10-CM

## 2019-07-30 DIAGNOSIS — T7800XA Anaphylactic reaction due to unspecified food, initial encounter: Secondary | ICD-10-CM | POA: Diagnosis not present

## 2019-07-30 DIAGNOSIS — J302 Other seasonal allergic rhinitis: Secondary | ICD-10-CM | POA: Insufficient documentation

## 2019-07-30 DIAGNOSIS — T7840XA Allergy, unspecified, initial encounter: Secondary | ICD-10-CM | POA: Diagnosis not present

## 2019-07-30 DIAGNOSIS — J4540 Moderate persistent asthma, uncomplicated: Secondary | ICD-10-CM | POA: Diagnosis not present

## 2019-07-30 DIAGNOSIS — Z91010 Allergy to peanuts: Secondary | ICD-10-CM | POA: Diagnosis not present

## 2019-07-30 DIAGNOSIS — H1013 Acute atopic conjunctivitis, bilateral: Secondary | ICD-10-CM

## 2019-07-30 DIAGNOSIS — L299 Pruritus, unspecified: Secondary | ICD-10-CM | POA: Diagnosis present

## 2019-07-30 DIAGNOSIS — H1010 Acute atopic conjunctivitis, unspecified eye: Secondary | ICD-10-CM | POA: Insufficient documentation

## 2019-07-30 MED ORDER — EPINEPHRINE 0.3 MG/0.3ML IJ SOAJ: 0.3000 mg | Freq: Once | INTRAMUSCULAR | 2 refills | Status: AC

## 2019-07-30 MED ORDER — TRIAMCINOLONE ACETONIDE 0.1 % EX OINT: 1.0000 "application " | TOPICAL_OINTMENT | Freq: Two times a day (BID) | CUTANEOUS | 5 refills | Status: AC

## 2019-07-30 MED ORDER — EUCRISA 2 % EX OINT: 1.0000 "application " | TOPICAL_OINTMENT | Freq: Two times a day (BID) | CUTANEOUS | 5 refills | Status: DC

## 2019-07-30 MED ORDER — VERAPAMIL HCL 80 MG PO TABS: 80.0000 mg | ORAL_TABLET | Freq: Three times a day (TID) | ORAL | 3 refills | Status: DC

## 2019-07-30 MED ORDER — FLOVENT HFA 220 MCG/ACT IN AERO: 2.0000 | INHALATION_SPRAY | Freq: Two times a day (BID) | RESPIRATORY_TRACT | 5 refills | Status: DC

## 2019-07-30 MED ORDER — MONTELUKAST SODIUM 10 MG PO TABS: 10.0000 mg | ORAL_TABLET | Freq: Every day | ORAL | 5 refills | Status: DC

## 2019-07-30 MED ORDER — PAZEO 0.7 % OP SOLN: 1.0000 [drp] | OPHTHALMIC | 5 refills | Status: DC

## 2019-07-30 MED ORDER — DIPHENHYDRAMINE HCL 50 MG/ML IJ SOLN
25.0000 mg | Freq: Once | INTRAMUSCULAR | Status: AC
  Administered 2019-07-30: 25 mg via INTRAVENOUS
  Filled 2019-07-30: qty 1

## 2019-07-30 MED ORDER — EPINEPHRINE 0.3 MG/0.3ML IJ SOAJ: 0.3000 mg | Freq: Once | INTRAMUSCULAR | 3 refills | Status: AC

## 2019-07-30 MED ORDER — BREZTRI AEROSPHERE 160-9-4.8 MCG/ACT IN AERO: 2.0000 | INHALATION_SPRAY | Freq: Two times a day (BID) | RESPIRATORY_TRACT | 5 refills | Status: DC

## 2019-07-30 MED ORDER — XHANCE 93 MCG/ACT NA EXHU: 2.0000 | INHALANT_SUSPENSION | Freq: Two times a day (BID) | NASAL | 3 refills | Status: DC

## 2019-07-30 NOTE — ED Triage Notes (Signed)
Pt arrived via EMS. Pt states she has had an allergic reaction to pumpkin seeds, gave herself an epi pen injection and 25mg  benadryl PO prior to EMS arriving at the scene. EMS states that pt still had global itching and no obvious hives. Pt has left sided wheezing and left sided diminished breath sounds per EMS  EMS gave 25mg  of benadryl 4mg  zofran 125 solumedrol 5mg  albuterol 5mg  albuterol and 0.5mg  atrovent duoneb No repeat administration of epi needed

## 2019-07-30 NOTE — Assessment & Plan Note (Signed)
The patient's history suggests food allergy and positive skin test results today confirm this diagnosis.  Meticulous avoidance of shellfish, fish, peanuts, tree nuts, soy, and any other foods which cause untoward symptoms.  A prescription has been provided for epinephrine auto-injector 2 pack (Auvi-Q) along with instructions for proper administration.  A food allergy action plan has been provided and discussed.  Medic Alert identification is recommended.

## 2019-07-30 NOTE — Assessment & Plan Note (Signed)
   Treatment plan as outlined above for allergic rhinitis.  A prescription has been provided for Pazeo, one drop per eye daily as needed.  I have also recommended eye lubricant drops (i.e., Natural Tears) as needed. 

## 2019-07-30 NOTE — Patient Instructions (Addendum)
Moderate persistent asthma Currently with suboptimal control.  In addition, todays spirometry results, assessed while asymptomatic, suggest under-perception of bronchoconstriction.  A prescription has been provided for BrezTri 160 g, 2 inhalations twice a day. To maximize pulmonary deposition, a spacer has been provided along with instructions for its proper administration with an HFA inhaler.  During respiratory tract infections or asthma flares, add Flovent 220g 2 inhalations 2 times per day until symptoms have returned to baseline.  For now, continue montelukast 10 mg daily, DuoNeb as needed, and albuterol as needed and 15 minutes prior to exercise.  Subjective and objective measures of pulmonary function will be followed and the treatment plan will be adjusted accordingly.  Perennial and seasonal allergic rhinitis  Aeroallergen avoidance measures have been discussed and provided in written form.  Continue levocetirizine (Xyzal) 5 mg daily as needed.  To avoid diminishing benefit with daily use (tachyphylaxis) of second generation antihistamine, consider alternating every few months between fexofenadine (Allegra) and levocetirizine (Xyzal).  A prescription has been provided for [facility], 2 actuations per nostril twice a day. Proper technique has been discussed and demonstrated.  Nasal saline spray (i.e., Simply Saline) or nasal saline lavage (i.e., NeilMed) is recommended as needed and prior to medicated nasal sprays.  The risks and benefits of aeroallergen immunotherapy have been discussed. The patient is motivated to initiate immunotherapy if insurance coverage is favorable. She will let us know how she would like to proceed.  Allergic conjunctivitis  Treatment plan as outlined above for allergic rhinitis.  A prescription has been provided for Pazeo, one drop per eye daily as needed.  I have also recommended eye lubricant drops (i.e., Natural Tears) as needed.  Eczema  I have  recommended Aquaphor to moisturize the skin.  A prescription has been provided for Eucrisa (crisaborole) 2% ointment twice a day to affected areas as needed.  For stubborn areas, if Eucrisa fails, may use triamcinolone 0.1% ointment sparingly to affected areas twice daily if needed.  This medication is not to be used on the face, neck, axillae, or groin.  Food allergy The patient's history suggests food allergy and positive skin test results today confirm this diagnosis.  Meticulous avoidance of shellfish, fish, peanuts, tree nuts, soy, and any other foods which cause untoward symptoms.  A prescription has been provided for epinephrine auto-injector 2 pack (Auvi-Q) along with instructions for proper administration.  A food allergy action plan has been provided and discussed.  Medic Alert identification is recommended.   Return in about 2 months (around 09/30/2019), or if symptoms worsen or fail to improve.  Control of Dust Mite Allergen  House dust mites play a major role in allergic asthma and rhinitis.  They occur in environments with high humidity wherever human skin, the food for dust mites is found. High levels have been detected in dust obtained from mattresses, pillows, carpets, upholstered furniture, bed covers, clothes and soft toys.  The principal allergen of the house dust mite is found in its feces.  A gram of dust may contain 1,000 mites and 250,000 fecal particles.  Mite antigen is easily measured in the air during house cleaning activities.    1. Encase mattresses, including the box spring, and pillow, in an air tight cover.  Seal the zipper end of the encased mattresses with wide adhesive tape. 2. Wash the bedding in water of 130 degrees Farenheit weekly.  Avoid cotton comforters/quilts and flannel bedding: the most ideal bed covering is the dacron comforter. 3. Remove all upholstered  furniture from the bedroom. 4. Remove carpets, carpet padding, rugs, and non-washable  window drapes from the bedroom.  Wash drapes weekly or use plastic window coverings. 5. Remove all non-washable stuffed toys from the bedroom.  Wash stuffed toys weekly. 6. Have the room cleaned frequently with a vacuum cleaner and a damp dust-mop.  The patient should not be in a room which is being cleaned and should wait 1 hour after cleaning before going into the room. 7. Close and seal all heating outlets in the bedroom.  Otherwise, the room will become filled with dust-laden air.  An electric heater can be used to heat the room. Reduce indoor humidity to less than 50%.  Do not use a humidifier.   Reducing Pollen Exposure  The American Academy of Allergy, Asthma and Immunology suggests the following steps to reduce your exposure to pollen during allergy seasons.    1. Do not hang sheets or clothing out to dry; pollen may collect on these items. 2. Do not mow lawns or spend time around freshly cut grass; mowing stirs up pollen. 3. Keep windows closed at night.  Keep car windows closed while driving. 4. Minimize morning activities outdoors, a time when pollen counts are usually at their highest. 5. Stay indoors as much as possible when pollen counts or humidity is high and on windy days when pollen tends to remain in the air longer. 6. Use air conditioning when possible.  Many air conditioners have filters that trap the pollen spores. 7. Use a HEPA room air filter to remove pollen form the indoor air you breathe.   Control of Dog or Cat Allergen  Avoidance is the best way to manage a dog or cat allergy. If you have a dog or cat and are allergic to dog or cats, consider removing the dog or cat from the home. If you have a dog or cat but don't want to find it a new home, or if your family wants a pet even though someone in the household is allergic, here are some strategies that may help keep symptoms at bay:  1. Keep the pet out of your bedroom and restrict it to only a few rooms. Be  advised that keeping the dog or cat in only one room will not limit the allergens to that room. 2. Don't pet, hug or kiss the dog or cat; if you do, wash your hands with soap and water. 3. High-efficiency particulate air (HEPA) cleaners run continuously in a bedroom or living room can reduce allergen levels over time. 4. Place electrostatic material sheet in the air inlet vent in the bedroom. 5. Regular use of a high-efficiency vacuum cleaner or a central vacuum can reduce allergen levels. 6. Giving your dog or cat a bath at least once a week can reduce airborne allergen.   Control of Mold Allergen  Mold and fungi can grow on a variety of surfaces provided certain temperature and moisture conditions exist.  Outdoor molds grow on plants, decaying vegetation and soil.  The major outdoor mold, Alternaria and Cladosporium, are found in very high numbers during hot and dry conditions.  Generally, a late Summer - Fall peak is seen for common outdoor fungal spores.  Rain will temporarily lower outdoor mold spore count, but counts rise rapidly when the rainy period ends.  The most important indoor molds are Aspergillus and Penicillium.  Dark, humid and poorly ventilated basements are ideal sites for mold growth.  The next most common sites of  mold growth are the bathroom and the kitchen.  Outdoor [employer] 1. Use air conditioning and keep windows closed 2. Avoid exposure to decaying vegetation. 3. Avoid leaf raking. 4. Avoid grain handling. 5. Consider wearing a face mask if working in moldy areas.  Indoor Mold Control 1. Maintain humidity below 50%. 2. Clean washable surfaces with 5% bleach solution. 3. Remove sources e.g. Contaminated carpets.   Control of Cockroach Allergen  Cockroach allergen has been identified as an important cause of acute attacks of asthma, especially in urban settings.  There are fifty-five species of cockroach that exist in the [place], however only three, the  [place], [place] species produce allergen that can affect patients with Asthma.  Allergens can be obtained from fecal particles, egg casings and secretions from cockroaches.    1. Remove food sources. 2. Reduce access to water. 3. Seal access and entry points. 4. Spray runways with 0.5-1% Diazinon or Chlorpyrifos 5. Blow boric acid power under stoves and refrigerator. 6. Place bait stations (hydramethylnon) at feeding sites.

## 2019-07-30 NOTE — Assessment & Plan Note (Signed)
   I have recommended Aquaphor to moisturize the skin.  A prescription has been provided for Eucrisa (crisaborole) 2% ointment twice a day to affected areas as needed.  For stubborn areas, if Eucrisa fails, may use triamcinolone 0.1% ointment sparingly to affected areas twice daily if needed.  This medication is not to be used on the face, neck, axillae, or groin.

## 2019-07-30 NOTE — ED Provider Notes (Signed)
[facility] DEPT Provider Note   CSN: [id number] Arrival date & time: 07/30/19  2120     History Chief Complaint  Patient presents with  . Allergic Reaction    Carla Buck is a 38 y.o. female.  38 year old female presents having had allergic reaction.  Patient thinks it might be from pumpkin seeds.  Has history of pan allergies.  Took an epinephrine pen which did help her symptoms which were described as pruritus and trouble breathing.  EMS was called and gave her additionally Solu-Medrol, albuterol, Benadryl.  She has been experiencing worsening of her asthma.  Does feel better at this point.        Past Medical History:  Diagnosis Date  . Allergy   . Anxiety   . Asthma   . Depression   . Eczema   . Migraine   . Urticaria     Patient Active Problem List   Diagnosis Date Noted  . Allergic conjunctivitis 07/30/2019  . Food allergy 07/30/2019  . Chronic migraine without aura without status migrainosus, not intractable 07/02/2019  . Right knee pain 06/21/2019  . History of migraine 06/19/2019  . Moderate persistent asthma 06/19/2019  . Perennial and seasonal allergic rhinitis 06/19/2019  . Ganglion cyst of finger 06/19/2019  . Eczema 06/19/2019  . Obesity (BMI 30-39.9) 06/19/2019  . 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 Anxiety and depression 06/19/2019    Past Surgical History:  Procedure Laterality Date  . ABDOMINAL HYSTERECTOMY  11/2015   fibroids  . BREAST LUMPECTOMY    . GASTRIC BYPASS    . OOPHORECTOMY Left 09/2018   torsion     OB History   No obstetric history on file.     Family History  Problem Relation Age of Onset  . Hypertension Mother   . Allergic rhinitis Mother   . Asthma Mother   . Eczema Mother   . Depression Father   . Asthma Father   . Bipolar disorder Sister   . Asthma Sister   . Hypertension Maternal Grandmother   . Hypertension Paternal  Grandmother   . Diabetes Paternal Grandmother   . Hypertension Paternal Grandfather   . Migraines Neg Hx     Social History   Tobacco Use  . Smoking status: Never Smoker  . Smokeless tobacco: Never Used  Substance Use Topics  . Alcohol use: Not on file    Comment: occasional  . Drug use: Never    Home Medications Prior to Admission medications   Medication Sig Start Date End Date Taking? Authorizing Provider  acetaminophen (TYLENOL) 325 MG tablet Take 325 mg by mouth every 6 (six) hours as needed.    [provider]  albuterol (VENTOLIN HFA) 108 (90 Base) MCG/ACT inhaler Inhale 2 puffs into the lungs every 6 (six) hours as needed for wheezing or shortness of breath.    [provider]  ALPRAZolam Duanne Moron) 0.25 MG tablet Take 1-2 tabs (0.25mg -0.50mg ) 30-60 minutes before procedure. May repeat if needed.Do not drive. 07/02/19   Melvenia Beam, MD  Budeson-Glycopyrrol-Formoterol (BREZTRI AEROSPHERE) 160-9-4.8 MCG/ACT AERO Inhale 2 puffs into the lungs 2 (two) times daily. 07/30/19   Bobbitt, Sedalia Muta, MD  calcium carbonate (OSCAL) 1500 (600 Ca) MG TABS tablet Take 1,500 mg by mouth daily with breakfast.    [provider]  Crisaborole (EUCRISA) 2 % OINT Apply 1 application topically 2 (two) times daily. 07/30/19   Golda Acre  Eulas Post, MD  diphenhydrAMINE (BENADRYL) 25 mg capsule Take 25 mg by mouth every 6 (six) hours as needed.    [provider]  EPINEPHrine (AUVI-Q) 0.3 mg/0.3 mL IJ SOAJ injection Inject 0.3 mLs (0.3 mg total) into the muscle once for 1 dose. As directed for life-threatening allergic reactions 07/30/19 07/30/19  Bobbitt, Sedalia Muta, MD  EPINEPHrine (EPIPEN 2-PAK) 0.3 mg/0.3 mL IJ SOAJ injection Inject 0.3 mg into the muscle as needed for anaphylaxis.    [provider]  ergocalciferol (VITAMIN D2) 1.25 MG (50000 UT) capsule Take 50,000 Units by mouth once a week.    [provider]  ferrous sulfate 325 (65  FE) MG tablet Take 325 mg by mouth daily with breakfast.    [provider]  fluticasone (FLOVENT HFA) 220 MCG/ACT inhaler Inhale 2 puffs into the lungs 2 (two) times daily. 07/30/19   Bobbitt, Sedalia Muta, MD  fluticasone furoate-vilanterol (BREO ELLIPTA) 200-25 MCG/INH AEPB Inhale 1 puff into the lungs daily.    [provider]  Fluticasone Propionate (XHANCE) 93 MCG/ACT EXHU Place 2 sprays into the nose 2 (two) times daily. 07/30/19   Bobbitt, Sedalia Muta, MD  Fremanezumab-vfrm (AJOVY) 225 MG/1.5ML SOAJ Inject 225 mg into the skin every 30 (thirty) days. 07/02/19   Melvenia Beam, MD  ipratropium-albuterol (DUONEB) 0.5-2.5 (3) MG/3ML SOLN Take 3 mLs by nebulization every 6 (six) hours as needed.    [provider]  levocetirizine (XYZAL) 5 MG tablet Take 5 mg by mouth every evening.    [provider]  montelukast (SINGULAIR) 10 MG tablet Take 10 mg by mouth at bedtime.    [provider]  montelukast (SINGULAIR) 10 MG tablet Take 1 tablet (10 mg total) by mouth at bedtime. 07/30/19   Bobbitt, Sedalia Muta, MD  Multiple Vitamins-Minerals (BARIATRIC MULTIVITAMINS/IRON PO) Take 1 tablet by mouth.    [provider]  Olopatadine HCl (PAZEO) 0.7 % SOLN Place 1 drop into both eyes 1 day or 1 dose. 07/30/19   Bobbitt, Sedalia Muta, MD  ondansetron (ZOFRAN ODT) 4 MG disintegrating tablet Take 1 tablet (4 mg total) by mouth every 8 (eight) hours as needed for nausea or vomiting. 07/19/19   Khatri, Hina, PA-C  propranolol (INDERAL) 10 MG tablet Take 10 mg by mouth as needed.    [provider]  rizatriptan (MAXALT-MLT) 10 MG disintegrating tablet Take 1 tablet (10 mg total) by mouth as needed for migraine. May repeat in 2 hours if needed 07/02/19   Melvenia Beam, MD  tamoxifen (NOLVADEX) 20 MG tablet Take 50 mg by mouth daily.     [provider]  traZODone (DESYREL) 150 MG tablet Take 150 mg by mouth at bedtime as needed for  sleep.    [provider]  triamcinolone ointment (KENALOG) 0.1 % Apply 1 application topically 2 (two) times daily.    [provider]  triamcinolone ointment (KENALOG) 0.1 % Apply 1 application topically 2 (two) times daily. 07/30/19   Bobbitt, Sedalia Muta, MD  venlafaxine (EFFEXOR) 100 MG tablet Take 100 mg by mouth 2 (two) times daily.    [provider]  verapamil (CALAN) 80 MG tablet Take 1 tablet (80 mg total) by mouth 3 (three) times daily. 07/30/19   Melvenia Beam, MD    Allergies    Bee venom, Peanut-containing drug products, Pollen extract, Shellfish allergy, Wasp venom, Xolair [omalizumab], Amoxicillin, Bug itch releaf [misc natural products], Contrave [naltrexone-bupropion hcl er], Corn-containing products, Dust mite  extract, Gluten meal, Iodine, Lidocaine, Orange fruit [citrus], Penicillins, Sesame oil, Soy allergy, Tetracaine, and Tomato  Review of Systems   Review of Systems  All other systems reviewed and are negative.   Physical Exam Updated Vital Signs BP 129/82 (BP Location: Right Arm)   Pulse 97   Temp (!) 97.4 F (36.3 C) (Oral)   Resp 17   Ht 1.651 m (5\' 5" )   Wt 80.7 kg   SpO2 100%   BMI 29.62 kg/m   Physical Exam Vitals and nursing note reviewed.  Constitutional:      General: She is not in acute distress.    Appearance: Normal appearance. She is well-developed. She is not toxic-appearing.  HENT:     Head: Normocephalic and atraumatic.  Eyes:     General: Lids are normal.     Conjunctiva/sclera: Conjunctivae normal.     Pupils: Pupils are equal, round, and reactive to light.  Neck:     Thyroid: No thyroid mass.     Trachea: No tracheal deviation.  Cardiovascular:     Rate and Rhythm: Normal rate and regular rhythm.     Heart sounds: Normal heart sounds. No murmur. No gallop.   Pulmonary:     Effort: Pulmonary effort is normal. No respiratory distress.     Breath sounds: Normal breath sounds. No stridor. No  decreased breath sounds, wheezing, rhonchi or rales.  Abdominal:     General: Bowel sounds are normal. There is no distension.     Palpations: Abdomen is soft.     Tenderness: There is no abdominal tenderness. There is no rebound.  Musculoskeletal:        General: No tenderness. Normal range of motion.     Cervical back: Normal range of motion and neck supple.  Skin:    General: Skin is warm and dry.     Findings: No abrasion or rash.  Neurological:     Mental Status: She is alert and oriented to person, place, and time.     GCS: GCS eye subscore is 4. GCS verbal subscore is 5. GCS motor subscore is 6.     Cranial Nerves: No cranial nerve deficit.     Sensory: No sensory deficit.  Psychiatric:        Speech: Speech normal.        Behavior: Behavior normal.     ED Results / Procedures / Treatments   Labs (all labs ordered are listed, but only abnormal results are displayed) Labs Reviewed - No data to display  EKG None  Radiology No results found.  Procedures Procedures (including critical care time)  Medications Ordered in ED Medications  diphenhydrAMINE (BENADRYL) injection 25 mg (has no administration in time range)    ED Course  I have reviewed the triage vital signs and the nursing notes.  Pertinent labs & imaging results that were available during my care of the patient were reviewed by me and considered in my medical decision making (see chart for details).    MDM Rules/Calculators/A&P                      Patient given Benadryl here and monitored and no signs of rebound phenomenon.  Will give refill of prescription for EpiPen Final Clinical Impression(s) / ED Diagnoses Final diagnoses:  None    Rx / DC Orders ED Discharge Orders    None       Lacretia Leigh, MD 07/30/19 2312

## 2019-07-30 NOTE — Assessment & Plan Note (Addendum)
Currently with suboptimal control.  In addition, todays spirometry results, assessed while asymptomatic, suggest under-perception of bronchoconstriction.  A prescription has been provided for BrezTri 160 g, 2 inhalations twice a day. To maximize pulmonary deposition, a spacer has been provided along with instructions for its proper administration with an HFA inhaler.  During respiratory tract infections or asthma flares, add Flovent 220g 2 inhalations 2 times per day until symptoms have returned to baseline.  For now, continue montelukast 10 mg daily, DuoNeb as needed, and albuterol as needed and 15 minutes prior to exercise.  Subjective and objective measures of pulmonary function will be followed and the treatment plan will be adjusted accordingly.

## 2019-07-30 NOTE — Progress Notes (Signed)
New Patient Note  RE: [name] MRN: [id number] DOB: July 14, 1981 Date of Office Visit: 07/30/2019  Referring provider: Ladell Pier, MD Primary care provider: Ladell Pier, MD  Chief Complaint: Asthma and Allergies   History of present illness: Carla Buck is a 38 y.o. female seen today in consultation requested by Carla Plumber, MD.  She reports that she has had asthma symptoms since she was 38 years old.  Her asthma symptoms are typically triggered by rapid weather changes, extremes of temperature, strong aromas such as perfume, exercise, upper respiratory tract infection, and pollen exposure.  Despite compliance with Breo Ellipta 200-25, 1 inhalation daily, and montelukast 10 mg daily at bedtime, she has required prednisone 4-6 times over the past year and has had to see her primary care physician urgently on 2 occasions due to asthma exacerbations.  She reports that last year she was close to being intubated during an ER visit.  She reports that she most recently received prednisone last week during an emergency room visit for asthma exacerbation.  She has been told by her allergist in the past that she has allergic asthma. Paizley experiences nasal congestion, rhinorrhea, sneezing, postnasal drainage, nasal pruritus, ocular pruritus, and maxillary sinus pressure.  These symptoms occur year-round but are most frequent and severe during the springtime.  She attempts to control the symptoms with levocetirizine 5 mg daily.  She has tried fluticasone nasal spray in the past without benefit. She reports that she had "really bad" eczema during childhood.  Currently the eczema primarily involves her hands and her feet.  She uses triamcinolone 0.1% ointment to control the eczema.  No specific food or environmental triggers have been identified which seem to correlate with eczema flares. She reports that she has been to the emergency department 6-8 times during her lifetime, and twice  over this past year, because of systemic reactions due to food allergies.  The symptoms of her reactions include urticaria, GI symptoms, and the sensation of chest tightness and throat tightness.  She avoids multiple foods, including peanut, tree nuts, soy, fish, and shellfish.   Assessment and plan: Moderate persistent asthma Currently with suboptimal control.  In addition, todays spirometry results, assessed while asymptomatic, suggest under-perception of bronchoconstriction.  A prescription has been provided for BrezTri 160 g, 2 inhalations twice a day. To maximize pulmonary deposition, a spacer has been provided along with instructions for its proper administration with an HFA inhaler.  During respiratory tract infections or asthma flares, add Flovent 220g 2 inhalations 2 times per day until symptoms have returned to baseline.  For now, continue montelukast 10 mg daily, DuoNeb as needed, and albuterol as needed and 15 minutes prior to exercise.  Subjective and objective measures of pulmonary function will be followed and the treatment plan will be adjusted accordingly.  Perennial and seasonal allergic rhinitis  Aeroallergen avoidance measures have been discussed and provided in written form.  Continue levocetirizine (Xyzal) 5 mg daily as needed.  To avoid diminishing benefit with daily use (tachyphylaxis) of second generation antihistamine, consider alternating every few months between fexofenadine (Allegra) and levocetirizine (Xyzal).  A prescription has been provided for [facility], 2 actuations per nostril twice a day. Proper technique has been discussed and demonstrated.  Nasal saline spray (i.e., Simply Saline) or nasal saline lavage (i.e., NeilMed) is recommended as needed and prior to medicated nasal sprays.  The risks and benefits of aeroallergen immunotherapy have been discussed. The patient is motivated to initiate immunotherapy if  insurance coverage is favorable. She will let  us know how she would like to proceed.  Allergic conjunctivitis  Treatment plan as outlined above for allergic rhinitis.  A prescription has been provided for Pazeo, one drop per eye daily as needed.  I have also recommended eye lubricant drops (i.e., Natural Tears) as needed.  Eczema  I have recommended Aquaphor to moisturize the skin.  A prescription has been provided for Eucrisa (crisaborole) 2% ointment twice a day to affected areas as needed.  For stubborn areas, if Eucrisa fails, may use triamcinolone 0.1% ointment sparingly to affected areas twice daily if needed.  This medication is not to be used on the face, neck, axillae, or groin.  Food allergy The patient's history suggests food allergy and positive skin test results today confirm this diagnosis.  Meticulous avoidance of shellfish, fish, peanuts, tree nuts, soy, and any other foods which cause untoward symptoms.  A prescription has been provided for epinephrine auto-injector 2 pack (Auvi-Q) along with instructions for proper administration.  A food allergy action plan has been provided and discussed.  Medic Alert identification is recommended.   Meds ordered this encounter  Medications  . Budeson-Glycopyrrol-Formoterol (BREZTRI AEROSPHERE) 160-9-4.8 MCG/ACT AERO    Sig: Inhale 2 puffs into the lungs 2 (two) times daily.    Dispense:  10.7 g    Refill:  5  . fluticasone (FLOVENT HFA) 220 MCG/ACT inhaler    Sig: Inhale 2 puffs into the lungs 2 (two) times daily.    Dispense:  1 Inhaler    Refill:  5  . montelukast (SINGULAIR) 10 MG tablet    Sig: Take 1 tablet (10 mg total) by mouth at bedtime.    Dispense:  30 tablet    Refill:  5  . Fluticasone Propionate (XHANCE) 93 MCG/ACT EXHU    Sig: Place 2 sprays into the nose 2 (two) times daily.    Dispense:  32 mL    Refill:  3  . Olopatadine HCl (PAZEO) 0.7 % SOLN    Sig: Place 1 drop into both eyes 1 day or 1 dose.    Dispense:  2.5 mL    Refill:  5  .  Crisaborole (EUCRISA) 2 % OINT    Sig: Apply 1 application topically 2 (two) times daily.    Dispense:  100 g    Refill:  5    May put in the refrigerator to have a cooling sensation  . triamcinolone ointment (KENALOG) 0.1 %    Sig: Apply 1 application topically 2 (two) times daily.    Dispense:  454 g    Refill:  5  . EPINEPHrine (AUVI-Q) 0.3 mg/0.3 mL IJ SOAJ injection    Sig: Inject 0.3 mLs (0.3 mg total) into the muscle once for 1 dose. As directed for life-threatening allergic reactions    Dispense:  2 each    Refill:  2    Diagnostics: Spirometry: FVC was 2.90 L and FEV1 was 2.60 L with 250 mL postbronchodilator improvement.  This study was performed while the patient was asymptomatic.  Please see scanned spirometry results for details. Epicutaneous testing: Robust reactivity to grass pollen, weed pollen, ragweed pollen, tree pollen, dust mite antigen, and cockroach antigen. Intradermal testing: Positive to major mold mix #1, #2, #3, #4, cat hair, and dog epithelia. Food allergen skin testing: Positive to peanut, pecan, walnut, hazelnut, soybean, shellfish mix, fish mix, codfish, shrimp, and crab.    Physical examination: Blood pressure 122/84, pulse 83,  temperature 97.9 F (36.6 C), temperature source Temporal, resp. rate 18, height 5\' 4"  (1.626 m), weight 182 lb 9.6 oz (82.8 kg), SpO2 98 %.  General: Alert, interactive, in no acute distress. HEENT: TMs pearly gray, turbinates moderately edematous with clear discharge, post-pharynx unremarkable. Neck: Supple without lymphadenopathy. Lungs: Clear to auscultation without wheezing, rhonchi or rales. CV: Normal S1, S2 without murmurs. Abdomen: Nondistended, nontender. Skin: Dry, cracking skin on the left second finger. Extremities:  No clubbing, cyanosis or edema. Neuro:   Grossly intact.  Review of systems:  Review of systems negative except as noted in HPI / PMHx or noted below: Review of Systems  Constitutional: Negative.    HENT: Negative.   Eyes: Negative.   Respiratory: Negative.   Cardiovascular: Negative.   Gastrointestinal: Negative.   Genitourinary: Negative.   Musculoskeletal: Negative.   Skin: Negative.   Neurological: Negative.   Endo/Heme/Allergies: Negative.   Psychiatric/Behavioral: Negative.     Past medical history:  Past Medical History:  Diagnosis Date  . Allergy   . Anxiety   . Asthma   . Depression   . Eczema   . Migraine   . Urticaria     Past surgical history:  Past Surgical History:  Procedure Laterality Date  . ABDOMINAL HYSTERECTOMY  11/2015   fibroids  . BREAST LUMPECTOMY    . GASTRIC BYPASS    . OOPHORECTOMY Left 09/2018   torsion    Family history: Family History  Problem Relation Age of Onset  . Hypertension Mother   . Allergic rhinitis Mother   . Asthma Mother   . Eczema Mother   . Depression Father   . Asthma Father   . Bipolar disorder Sister   . Asthma Sister   . Hypertension Maternal Grandmother   . Hypertension Paternal Grandmother   . Diabetes Paternal Grandmother   . Hypertension Paternal Grandfather   . Migraines Neg Hx     Social history: Social History   Socioeconomic History  . Marital status: Married    Spouse name: Not on file  . Number of children: 3  . Years of education: Not on file  . Highest education level: Master's degree (e.g., MA, MS, MEng, MEd, MSW, MBA)  Occupational History  . Not on file  Tobacco Use  . Smoking status: Never Smoker  . Smokeless tobacco: Never Used  Substance and Sexual Activity  . Alcohol use: Not on file    Comment: occasional  . Drug use: Never  . Sexual activity: Not on file  Other Topics Concern  . Not on file  Social History Narrative   Lives at home with her children    Right handed   Caffeine: 2-3 cups/day   Social Determinants of Health   Financial Resource Strain:   . Difficulty of Paying Living Expenses: Not on file  Food Insecurity:   . Worried About Sales executive in the Last Year: Not on file  . Ran Out of Food in the Last Year: Not on file  Transportation Needs:   . Lack of Transportation (Medical): Not on file  . Lack of Transportation (Non-Medical): Not on file  Physical Activity:   . Days of Exercise per Week: Not on file  . Minutes of Exercise per Session: Not on file  Stress:   . Feeling of Stress : Not on file  Social Connections:   . Frequency of Communication with Friends and Family: Not on file  . Frequency of Social Gatherings with  Friends and Family: Not on file  . Attends Religious Services: Not on file  . Active Member of Clubs or Organizations: Not on file  . Attends Archivist Meetings: Not on file  . Marital Status: Not on file  Intimate Partner Violence:   . Fear of Current or Ex-Partner: Not on file  . Emotionally Abused: Not on file  . Physically Abused: Not on file  . Sexually Abused: Not on file    Environmental History: The patient lives in a 37 year old house with hardwood floors throughout and central air/heat.  There is no known mold/water damage in the home.  She has 2 dogs in the home which have access to her bedroom.  She is a non-smoker.  Current Outpatient Medications  Medication Sig Dispense Refill  . acetaminophen (TYLENOL) 325 MG tablet Take 325 mg by mouth every 6 (six) hours as needed.    Marland Kitchen albuterol (VENTOLIN HFA) 108 (90 Base) MCG/ACT inhaler Inhale 2 puffs into the lungs every 6 (six) hours as needed for wheezing or shortness of breath.    . ALPRAZolam (XANAX) 0.25 MG tablet Take 1-2 tabs (0.25mg -0.50mg ) 30-60 minutes before procedure. May repeat if needed.Do not drive. 4 tablet 0  . calcium carbonate (OSCAL) 1500 (600 Ca) MG TABS tablet Take 1,500 mg by mouth daily with breakfast.    . diphenhydrAMINE (BENADRYL) 25 mg capsule Take 25 mg by mouth every 6 (six) hours as needed.    Marland Kitchen EPINEPHrine (EPIPEN 2-PAK) 0.3 mg/0.3 mL IJ SOAJ injection Inject 0.3 mg into the muscle as needed for  anaphylaxis.    Marland Kitchen ergocalciferol (VITAMIN D2) 1.25 MG (50000 UT) capsule Take 50,000 Units by mouth once a week.    . ferrous sulfate 325 (65 FE) MG tablet Take 325 mg by mouth daily with breakfast.    . fluticasone furoate-vilanterol (BREO ELLIPTA) 200-25 MCG/INH AEPB Inhale 1 puff into the lungs daily.    . Fremanezumab-vfrm (AJOVY) 225 MG/1.5ML SOAJ Inject 225 mg into the skin every 30 (thirty) days. 1 pen 11  . ipratropium-albuterol (DUONEB) 0.5-2.5 (3) MG/3ML SOLN Take 3 mLs by nebulization every 6 (six) hours as needed.    Marland Kitchen levocetirizine (XYZAL) 5 MG tablet Take 5 mg by mouth every evening.    . montelukast (SINGULAIR) 10 MG tablet Take 10 mg by mouth at bedtime.    . Multiple Vitamins-Minerals (BARIATRIC MULTIVITAMINS/IRON PO) Take 1 tablet by mouth.    . ondansetron (ZOFRAN ODT) 4 MG disintegrating tablet Take 1 tablet (4 mg total) by mouth every 8 (eight) hours as needed for nausea or vomiting. 4 tablet 0  . propranolol (INDERAL) 10 MG tablet Take 10 mg by mouth as needed.    . rizatriptan (MAXALT-MLT) 10 MG disintegrating tablet Take 1 tablet (10 mg total) by mouth as needed for migraine. May repeat in 2 hours if needed 9 tablet 11  . tamoxifen (NOLVADEX) 20 MG tablet Take 50 mg by mouth daily.     . traZODone (DESYREL) 150 MG tablet Take 150 mg by mouth at bedtime as needed for sleep.    Marland Kitchen triamcinolone ointment (KENALOG) 0.1 % Apply 1 application topically 2 (two) times daily.    Marland Kitchen venlafaxine (EFFEXOR) 100 MG tablet Take 100 mg by mouth 2 (two) times daily.    . Budeson-Glycopyrrol-Formoterol (BREZTRI AEROSPHERE) 160-9-4.8 MCG/ACT AERO Inhale 2 puffs into the lungs 2 (two) times daily. 10.7 g 5  . Crisaborole (EUCRISA) 2 % OINT Apply 1 application topically 2 (two) times  daily. 100 g 5  . EPINEPHrine (AUVI-Q) 0.3 mg/0.3 mL IJ SOAJ injection Inject 0.3 mLs (0.3 mg total) into the muscle once for 1 dose. As directed for life-threatening allergic reactions 2 each 2  . fluticasone  (FLOVENT HFA) 220 MCG/ACT inhaler Inhale 2 puffs into the lungs 2 (two) times daily. 1 Inhaler 5  . Fluticasone Propionate (XHANCE) 93 MCG/ACT EXHU Place 2 sprays into the nose 2 (two) times daily. 32 mL 3  . montelukast (SINGULAIR) 10 MG tablet Take 1 tablet (10 mg total) by mouth at bedtime. 30 tablet 5  . Olopatadine HCl (PAZEO) 0.7 % SOLN Place 1 drop into both eyes 1 day or 1 dose. 2.5 mL 5  . triamcinolone ointment (KENALOG) 0.1 % Apply 1 application topically 2 (two) times daily. 454 g 5  . verapamil (CALAN) 80 MG tablet Take 1 tablet (80 mg total) by mouth 3 (three) times daily. 270 tablet 3   No current facility-administered medications for this visit.    Known medication allergies: Allergies  Allergen Reactions  . Bee Venom Anaphylaxis  . Peanut-Containing Drug Products Anaphylaxis  . Pollen Extract Shortness Of Breath    Tree and grass   . Shellfish Allergy Anaphylaxis  . Wasp Venom Anaphylaxis  . Xolair [Omalizumab] Anaphylaxis  . Amoxicillin Hives  . Bug Itch Releaf [Misc Natural Products] Hives    Roaches, ants and dustmites  . Contrave [Naltrexone-Bupropion Hcl Er] Hives  . Corn-Containing Products     GI unset  . Dust Mite Extract     Asthma trigger  . Gluten Meal     GI upset, HIVeS  . Iodine Hives  . Lidocaine Hives  . Orange Fruit [Citrus] Hives  . Penicillins Hives  . Sesame Oil Diarrhea    GI upset  . Soy Allergy Hives    GI upset  . Tetracaine Hives  . Tomato Hives    I appreciate the opportunity to take part in Baker's care. Please do not hesitate to contact me with questions.  Sincerely,   R. Edgar Frisk, MD

## 2019-07-30 NOTE — Assessment & Plan Note (Addendum)
   Aeroallergen avoidance measures have been discussed and provided in written form.  Continue levocetirizine (Xyzal) 5 mg daily as needed.  To avoid diminishing benefit with daily use (tachyphylaxis) of second generation antihistamine, consider alternating every few months between fexofenadine (Allegra) and levocetirizine (Xyzal).  A prescription has been provided for [facility], 2 actuations per nostril twice a day. Proper technique has been discussed and demonstrated.  Nasal saline spray (i.e., Simply Saline) or nasal saline lavage (i.e., NeilMed) is recommended as needed and prior to medicated nasal sprays.  The risks and benefits of aeroallergen immunotherapy have been discussed. The patient is motivated to initiate immunotherapy if insurance coverage is favorable. She will let us know how she would like to proceed.

## 2019-07-31 NOTE — Progress Notes (Signed)
VIALS EXP 07-30-20

## 2019-08-01 DIAGNOSIS — J3089 Other allergic rhinitis: Secondary | ICD-10-CM | POA: Diagnosis not present

## 2019-08-02 ENCOUNTER — Telehealth: Payer: Self-pay

## 2019-08-02 DIAGNOSIS — J3081 Allergic rhinitis due to animal (cat) (dog) hair and dander: Secondary | ICD-10-CM | POA: Diagnosis not present

## 2019-08-02 NOTE — Telephone Encounter (Signed)
Spoke to pt. To see what inhalers she has used prior to sending in cover my meds for the [facility]. Pt. Paid 40.00 out of pocket for it today. Once we get it approved through her insurance hopefully it maybe a little less. I printed out [facility] drug formulary 2020 and I didn't even see it on there formulary for 2020.

## 2019-08-23 ENCOUNTER — Ambulatory Visit: Payer: BC Managed Care – PPO

## 2019-08-23 ENCOUNTER — Ambulatory Visit: Payer: BC Managed Care – PPO | Admitting: Pediatrics

## 2019-08-23 ENCOUNTER — Other Ambulatory Visit: Payer: Self-pay

## 2019-08-23 VITALS — BP 114/88 | Ht 65.0 in | Wt 183.0 lb

## 2019-08-23 DIAGNOSIS — M25561 Pain in right knee: Secondary | ICD-10-CM

## 2019-08-23 NOTE — Progress Notes (Signed)
  Carla Buck - 40 y.o. female MRN [id number]  Date of birth: [birth year]  SUBJECTIVE:   CC: right knee pain f/u  Follow up of right knee pain, with injury 2 months ago. She has had improvement in pain since steroid injection on 12/10. She is planning to start exercising on the treadmill next week. . She reports that she has intermittent pain-- some days she walks with a limp and some days she is better. She is using the knee brace. Pain with bending knee. Would like an injection today.  ROS: No instability, muscle pain, numbness/tingling, redness, otherwise see HPI   PMHx - Updated and reviewed.  Contributory factors include: Negative PSHx - Updated and reviewed.  Contributory factors include:  Negative FHx - Updated and reviewed.  Contributory factors include:  Negative Social Hx - Updated and reviewed. Contributory factors include: Negative Medications - reviewed   DATA REVIEWED: MRI showed mild meniscal degeneration and PTF, medial compartment OA  PHYSICAL EXAM:  VS: BP:114/88  HR: bpm  TEMP: ( )  RESP:   HT:5\' 5"  (165.1 cm)   WT:183 lb (83 kg)  BMI:30.45 PHYSICAL EXAM: Gen: NAD, alert, cooperative with exam, well-appearing HEENT: clear conjunctiva,  CV:  no edema, capillary refill brisk, normal rate Resp: non-labored Skin: no rashes, normal turgor  Neuro: no gross deficits.  Psych:  alert and oriented  Right knee: - Inspection: normal on inspection, no effusion, bruising - Palpation: no TTP - ROM: full active ROM with flexion and extension in knee and hip with no pain - Strength: 5/5 strength - Neuro/vasc: NV intact   ASSESSMENT & PLAN:   39 yo presenting for follow up of right knee pain, with right patellofemoral and medial compartment OA that was aggravated by recent injury. Improved since last corticosteroid injection in December. Instructed her to continue isometric quad exercises and start back slowly with treadmill exercising, increasing intensity gradually  over time. Use brace as needed.  Allene Pyo, MD Sports Medicine Fellow [place]  I was the preceptor for this visit and available for immediate consultation Shellia Cleverly, DO

## 2019-08-24 MED ORDER — OLOPATADINE HCL 0.2 % OP SOLN: 1.0000 [drp] | Freq: Two times a day (BID) | OPHTHALMIC | 5 refills | Status: DC

## 2019-08-29 ENCOUNTER — Other Ambulatory Visit: Payer: Self-pay

## 2019-08-29 ENCOUNTER — Ambulatory Visit (INDEPENDENT_AMBULATORY_CARE_PROVIDER_SITE_OTHER): Payer: BC Managed Care – PPO | Admitting: *Deleted

## 2019-08-29 DIAGNOSIS — J309 Allergic rhinitis, unspecified: Secondary | ICD-10-CM | POA: Diagnosis not present

## 2019-08-29 NOTE — Progress Notes (Signed)
Immunotherapy   Patient Details  Name: Carla Buck MRN: [id number] Date of Birth: 1981-06-06  08/29/2019  [name] started injections for  G-WEEDS-T-DM, M-C-D-CR Following schedule: A  Frequency: 1-2X Weekly with 72 hours in between Epi-Pen: Yes Consent signed and patient instructions given. Patient started allergy injections today and received .69mL of G-WEEDS-T-DM in the RUA and .62mL of M-C-D-CR in the LUA. Patient waited 30 minutes and did not experience any issues.    [name] 08/29/2019, 12:11 PM

## 2019-08-29 NOTE — Telephone Encounter (Signed)
Please read note and addend the note if possible.

## 2019-08-30 ENCOUNTER — Telehealth: Payer: Self-pay

## 2019-08-30 NOTE — Telephone Encounter (Signed)
Carla Buck has taken care of the letter.

## 2019-08-31 ENCOUNTER — Other Ambulatory Visit: Payer: Self-pay | Admitting: *Deleted

## 2019-08-31 MED ORDER — EPINEPHRINE 0.3 MG/0.3ML IJ SOAJ: 0.3000 mg | Freq: Once | INTRAMUSCULAR | 1 refills | Status: AC

## 2019-09-04 ENCOUNTER — Ambulatory Visit (INDEPENDENT_AMBULATORY_CARE_PROVIDER_SITE_OTHER): Payer: BC Managed Care – PPO

## 2019-09-04 DIAGNOSIS — J309 Allergic rhinitis, unspecified: Secondary | ICD-10-CM | POA: Diagnosis not present

## 2019-09-10 ENCOUNTER — Encounter: Payer: Self-pay | Admitting: Internal Medicine

## 2019-09-11 ENCOUNTER — Ambulatory Visit (INDEPENDENT_AMBULATORY_CARE_PROVIDER_SITE_OTHER): Payer: BC Managed Care – PPO

## 2019-09-11 DIAGNOSIS — J309 Allergic rhinitis, unspecified: Secondary | ICD-10-CM | POA: Diagnosis not present

## 2019-09-12 ENCOUNTER — Emergency Department (HOSPITAL_COMMUNITY)
Admission: EM | Admit: 2019-09-12 | Discharge: 2019-09-12 | Disposition: A | Discharge status: Home / Self Care | Payer: BC Managed Care – PPO | Attending: Emergency Medicine | Admitting: Emergency Medicine

## 2019-09-12 ENCOUNTER — Encounter (HOSPITAL_COMMUNITY): Payer: Self-pay | Admitting: Emergency Medicine

## 2019-09-12 DIAGNOSIS — J4541 Moderate persistent asthma with (acute) exacerbation: Secondary | ICD-10-CM | POA: Insufficient documentation

## 2019-09-12 DIAGNOSIS — Z79899 Other long term (current) drug therapy: Secondary | ICD-10-CM | POA: Insufficient documentation

## 2019-09-12 DIAGNOSIS — Z91010 Allergy to peanuts: Secondary | ICD-10-CM | POA: Diagnosis not present

## 2019-09-12 DIAGNOSIS — R0602 Shortness of breath: Secondary | ICD-10-CM | POA: Diagnosis present

## 2019-09-12 MED ORDER — PREDNISONE 50 MG PO TABS: 50.0000 mg | ORAL_TABLET | Freq: Every day | ORAL | 0 refills | Status: AC

## 2019-09-12 MED ORDER — PREDNISONE 20 MG PO TABS
60.0000 mg | ORAL_TABLET | Freq: Once | ORAL | Status: AC
  Administered 2019-09-12: 60 mg via ORAL
  Filled 2019-09-12: qty 3

## 2019-09-12 MED ORDER — ALBUTEROL SULFATE HFA 108 (90 BASE) MCG/ACT IN AERS
6.0000 | INHALATION_SPRAY | Freq: Once | RESPIRATORY_TRACT | Status: AC
  Administered 2019-09-12: 19:00:00 6 via RESPIRATORY_TRACT
  Filled 2019-09-12: qty 6.7

## 2019-09-12 MED ORDER — ONDANSETRON HCL 4 MG/2ML IJ SOLN
4.0000 mg | Freq: Once | INTRAMUSCULAR | Status: AC
  Administered 2019-09-12: 19:00:00 4 mg via INTRAVENOUS
  Filled 2019-09-12: qty 2

## 2019-09-12 NOTE — ED Triage Notes (Signed)
Pt states she has asthma and is been having an asthma attack for the last few hours with no relief from her inhaler and has gave herself a neb treatment. Pt denies any fever or chills or cough.

## 2019-09-12 NOTE — ED Provider Notes (Signed)
[facility] EMERGENCY DEPARTMENT Provider Note   CSN: [id number] Arrival date & time: 09/12/19  1802     History Chief Complaint  Patient presents with  . Shortness of Breath  . Asthma    Carla Buck is a 39 y.o. female with PMH significant for asthma and significant list of allergies who presents to the ED with complaints of shortness of breath.  Patient reports that she has a 1 day history of progressively worsening chest tightness, particular on the left side, with associated shortness of breath symptoms.  She reports this is how her typical asthma attacks feel.  Her last asthma attack was approximately 1 month ago when she is followed by pulmonology who recently placed her on new medications.  She denies any fevers or chills, recent was, exertional chest pain, abdominal discomfort, nausea or vomiting, diaphoresis, history of clots, leg swelling or arm swelling, immobilization or surgeries, or any other symptoms.  HPI     Past Medical History:  Diagnosis Date  . Allergy   . Anxiety   . Asthma   . Depression   . Eczema   . Migraine   . Urticaria     Patient Active Problem List   Diagnosis Date Noted  . Allergic conjunctivitis 07/30/2019  . Food allergy 07/30/2019  . Chronic migraine without aura without status migrainosus, not intractable 07/02/2019  . Right knee pain 06/21/2019  . History of migraine 06/19/2019  . Moderate persistent asthma 06/19/2019  . Perennial and seasonal allergic rhinitis 06/19/2019  . Ganglion cyst of finger 06/19/2019  . Eczema 06/19/2019  . Obesity (BMI 30-39.9) 06/19/2019  . 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 Anxiety and depression 06/19/2019    Past Surgical History:  Procedure Laterality Date  . ABDOMINAL HYSTERECTOMY  11/2015   fibroids  . BREAST LUMPECTOMY    . GASTRIC BYPASS    . OOPHORECTOMY Left 09/2018   torsion     OB History   No obstetric  history on file.     Family History  Problem Relation Age of Onset  . Hypertension Mother   . Allergic rhinitis Mother   . Asthma Mother   . Eczema Mother   . Depression Father   . Asthma Father   . Bipolar disorder Sister   . Asthma Sister   . Hypertension Maternal Grandmother   . Hypertension Paternal Grandmother   . Diabetes Paternal Grandmother   . Hypertension Paternal Grandfather   . Migraines Neg Hx     Social History   Tobacco Use  . Smoking status: Never Smoker  . Smokeless tobacco: Never Used  Substance Use Topics  . Alcohol use: Not on file    Comment: occasional  . Drug use: Never    Home Medications Prior to Admission medications   Medication Sig Start Date End Date Taking? Authorizing Provider  acetaminophen (TYLENOL) 325 MG tablet Take 325 mg by mouth every 6 (six) hours as needed.    [provider]  albuterol (VENTOLIN HFA) 108 (90 Base) MCG/ACT inhaler Inhale 2 puffs into the lungs every 6 (six) hours as needed for wheezing or shortness of breath.    [provider]  ALPRAZolam (XANAX) 0.25 MG tablet Take 1-2 tabs (0.25mg -0.50mg ) 30-60 minutes before procedure. May repeat if needed.Do not drive. Patient not taking: Reported on 08/23/2019 07/02/19   Melvenia Beam, MD  Budeson-Glycopyrrol-Formoterol (BREZTRI AEROSPHERE) 160-9-4.8 MCG/ACT AERO Inhale 2 puffs into  the lungs 2 (two) times daily. 07/30/19   Bobbitt, Sedalia Muta, MD  calcium carbonate (OSCAL) 1500 (600 Ca) MG TABS tablet Take 1,500 mg by mouth daily with breakfast.    [provider]  Crisaborole (EUCRISA) 2 % OINT Apply 1 application topically 2 (two) times daily. 07/30/19   Bobbitt, Sedalia Muta, MD  diphenhydrAMINE (BENADRYL) 25 mg capsule Take 25 mg by mouth every 6 (six) hours as needed.    [provider]  EPINEPHrine (EPIPEN 2-PAK) 0.3 mg/0.3 mL IJ SOAJ injection Inject 0.3 mg into the muscle as needed for anaphylaxis.    [provider]   ergocalciferol (VITAMIN D2) 1.25 MG (50000 UT) capsule Take 50,000 Units by mouth once a week.    [provider]  ferrous sulfate 325 (65 FE) MG tablet Take 325 mg by mouth daily with breakfast.    [provider]  fexofenadine-pseudoephedrine (ALLEGRA-D 24) 180-240 MG 24 hr tablet Take 1 tablet by mouth daily. Rotates each month between Allegra and Xyzal.    [provider]  fluticasone (FLOVENT HFA) 220 MCG/ACT inhaler Inhale 2 puffs into the lungs 2 (two) times daily. 07/30/19   Bobbitt, Sedalia Muta, MD  fluticasone furoate-vilanterol (BREO ELLIPTA) 200-25 MCG/INH AEPB Inhale 1 puff into the lungs daily.    [provider]  Fluticasone Propionate (XHANCE) 93 MCG/ACT EXHU Place 2 sprays into the nose 2 (two) times daily. 07/30/19   Bobbitt, Sedalia Muta, MD  Fremanezumab-vfrm (AJOVY) 225 MG/1.5ML SOAJ Inject 225 mg into the skin every 30 (thirty) days. 07/02/19   Melvenia Beam, MD  ipratropium-albuterol (DUONEB) 0.5-2.5 (3) MG/3ML SOLN Take 3 mLs by nebulization every 6 (six) hours as needed.    [provider]  levocetirizine (XYZAL) 5 MG tablet Take 5 mg by mouth every evening.    [provider]  montelukast (SINGULAIR) 10 MG tablet Take 10 mg by mouth at bedtime.    [provider]  montelukast (SINGULAIR) 10 MG tablet Take 1 tablet (10 mg total) by mouth at bedtime. 07/30/19   Bobbitt, Sedalia Muta, MD  Multiple Vitamins-Minerals (BARIATRIC MULTIVITAMINS/IRON PO) Take 1 tablet by mouth.    [provider]  Olopatadine HCl (PATADAY) 0.2 % SOLN Place 1 drop into both eyes 2 (two) times daily. 08/24/19   Valentina Shaggy, MD  ondansetron (ZOFRAN ODT) 4 MG disintegrating tablet Take 1 tablet (4 mg total) by mouth every 8 (eight) hours as needed for nausea or vomiting. 07/19/19   Khatri, Hina, PA-C  predniSONE (DELTASONE) 50 MG tablet Take 1 tablet (50 mg total) by mouth daily with breakfast for 5 days. 09/12/19 09/17/19   Corena Herter, PA-C  propranolol (INDERAL) 10 MG tablet Take 10 mg by mouth as needed.    [provider]  rizatriptan (MAXALT-MLT) 10 MG disintegrating tablet Take 1 tablet (10 mg total) by mouth as needed for migraine. May repeat in 2 hours if needed 07/02/19   Melvenia Beam, MD  tamoxifen (NOLVADEX) 20 MG tablet Take 50 mg by mouth daily.     [provider]  traZODone (DESYREL) 150 MG tablet Take 150 mg by mouth at bedtime as needed for sleep.    [provider]  triamcinolone ointment (KENALOG) 0.1 % Apply 1 application topically 2 (two) times daily.    [provider]  triamcinolone ointment (KENALOG) 0.1 % Apply 1 application topically 2 (two) times daily. 07/30/19   Bobbitt, Sedalia Muta, MD  venlafaxine (EFFEXOR) 100 MG tablet  Take 100 mg by mouth 2 (two) times daily.    [provider]  verapamil (CALAN) 80 MG tablet Take 1 tablet (80 mg total) by mouth 3 (three) times daily. 07/30/19   Melvenia Beam, MD    Allergies    Bee venom, Peanut-containing drug products, Pollen extract, Shellfish allergy, Wasp venom, Xolair [omalizumab], Amoxicillin, Bug itch releaf [misc natural products], Contrave [naltrexone-bupropion hcl er], Corn-containing products, Dust mite extract, Gluten meal, Iodine, Lidocaine, Orange fruit [citrus], Penicillins, Sesame oil, Soy allergy, Tetracaine, and Tomato  Review of Systems   Review of Systems  Constitutional: Negative for diaphoresis and fever.  Respiratory: Positive for chest tightness and shortness of breath.   Cardiovascular: Negative for chest pain.  Gastrointestinal: Negative for nausea.    Physical Exam Updated Vital Signs BP 123/87   Pulse 83   Temp 99 F (37.2 C) (Oral)   Resp 18   SpO2 100%   Physical Exam Vitals and nursing note reviewed. Exam conducted with a chaperone present.  Constitutional:      Appearance: Normal appearance.  HENT:     Head: Normocephalic and atraumatic.   Eyes:     General: No scleral icterus.    Conjunctiva/sclera: Conjunctivae normal.  Cardiovascular:     Rate and Rhythm: Normal rate and regular rhythm.     Pulses: Normal pulses.     Heart sounds: Normal heart sounds.  Pulmonary:     Comments: No increased respiratory effort or distress.  No accessory muscle use.  Breath sounds intact bilaterally.  No wheezing or other abnormal breath sounds appreciated on exam. Abdominal:     General: Abdomen is flat. There is no distension.     Palpations: Abdomen is soft.     Tenderness: There is no abdominal tenderness. There is no guarding.  Musculoskeletal:     Cervical back: Normal range of motion and neck supple. No rigidity.  Skin:    General: Skin is dry.     Capillary Refill: Capillary refill takes less than 2 seconds.  Neurological:     Mental Status: She is alert and oriented to person, place, and time.     GCS: GCS eye subscore is 4. GCS verbal subscore is 5. GCS motor subscore is 6.  Psychiatric:        Mood and Affect: Mood normal.        Behavior: Behavior normal.        Thought Content: Thought content normal.     ED Results / Procedures / Treatments   Labs (all labs ordered are listed, but only abnormal results are displayed) Labs Reviewed - No data to display  EKG None  Radiology No results found.  Procedures Procedures (including critical care time)  Medications Ordered in ED Medications  ondansetron (ZOFRAN) injection 4 mg (4 mg Intravenous Given 09/12/19 1915)  predniSONE (DELTASONE) tablet 60 mg (60 mg Oral Given 09/12/19 1913)  albuterol (VENTOLIN HFA) 108 (90 Base) MCG/ACT inhaler 6 puff (6 puffs Inhalation Given 09/12/19 1912)    ED Course  I have reviewed the triage vital signs and the nursing notes.  Pertinent labs & imaging results that were available during my care of the patient were reviewed by me and considered in my medical decision making (see chart for details).    MDM Rules/Calculators/A&P                       She was initially placed on 2 L supplemental O2 by  the nurse on arrival because she was claiming to be short of breath, despite normal respiratory rate and 99% on room air.  I removed her nasal cannula soon thereafter and she was saturating fine on room air which we kept her on throughout the remainder of the examination.  Patient requires Zofran before any prednisone administration otherwise she becomes acutely nauseated.  Zofran IV administered here in the ED.  She was then given prednisone 60 mg p.o. in addition to 6 puffs albuterol.  She denies any history of clots, clotting disorder, sudden onset shortness of breath symptoms, pleuritic cough, recent immobilization or surgery, or other concerning history for pulmonary embolism.  She is PERC negative.  She also denies any cough or fever that might otherwise be concerning for pneumonia.  She denies any other upper respiratory infection symptoms and has been tested numerous times for COVID-19.  Not feel as though chest x-ray is warranted at this time.  On reexamination, patient is feeling significantly improved.  She feels as though her asthma exacerbation has been relieved and she feels prepared for discharge at this time.  We will discharge her with prednisone burst and asked that she follow-up with her pulmonologist and PCP regarding today's encounter.  Also encouraging her to continue her outpatient asthma medications, as directed.  She has Zofran ODT at home and does not require additional prescription at this time.  Discussed strict return precautions.  She voiced understanding and is agreeable to plan.     Final Clinical Impression(s) / ED Diagnoses Final diagnoses:  Moderate persistent asthma with exacerbation    Rx / DC Orders ED Discharge Orders         Ordered    predniSONE (DELTASONE) 50 MG tablet  Daily with breakfast     09/12/19 1958           Corena Herter, PA-C 09/12/19 2009    [address], DO  09/12/19 2111

## 2019-09-12 NOTE — Discharge Instructions (Signed)
Please take your prednisone, as prescribed.  Please take all of your other medications, as directed.  Please follow-up with your primary care provider or pulmonologist regarding today's encounter.  Please return to the ED or seek immediate medical attention for any new or worsening symptoms.

## 2019-09-20 ENCOUNTER — Ambulatory Visit (INDEPENDENT_AMBULATORY_CARE_PROVIDER_SITE_OTHER): Payer: BC Managed Care – PPO

## 2019-09-20 DIAGNOSIS — J309 Allergic rhinitis, unspecified: Secondary | ICD-10-CM | POA: Diagnosis not present

## 2019-09-26 ENCOUNTER — Ambulatory Visit (INDEPENDENT_AMBULATORY_CARE_PROVIDER_SITE_OTHER): Payer: BC Managed Care – PPO

## 2019-09-26 DIAGNOSIS — J309 Allergic rhinitis, unspecified: Secondary | ICD-10-CM

## 2019-09-28 MED ORDER — ALBUTEROL SULFATE HFA 108 (90 BASE) MCG/ACT IN AERS: 2.0000 | INHALATION_SPRAY | Freq: Four times a day (QID) | RESPIRATORY_TRACT | 1 refills | Status: DC | PRN

## 2019-10-02 NOTE — Progress Notes (Signed)
[id number]    Provider:  Dr Jaynee Eagles Requesting Provider: Ladell Pier, MD Primary Care Provider:  Ladell Pier, MD   Virtual Visit via Video Note  I connected with Carla Buck on 10/03/19 at  3:00 PM EST by a video enabled telemedicine application and verified that I am speaking with the correct person using two identifiers.  Location: Patient: home Provider: office   I discussed the limitations of evaluation and management by telemedicine and the availability of in person appointments. The patient expressed understanding and agreed to proceed.   Follow Up Instructions:    I discussed the assessment and treatment plan with the patient. The patient was provided an opportunity to ask questions and all were answered. The patient agreed with the plan and demonstrated an understanding of the instructions.   The patient was advised to call back or seek an in-person evaluation if the symptoms worsen or if the condition fails to improve as anticipated.  I provided 15 minutes of non-face-to-face time during this encounter.   Melvenia Beam, MD    CC:  Migraines  Interval history: She feels the Arie Sabina is just starting to work, the first few months it did not but only 4 migraine days last month so she would like to keep trying it. We reviewed mri of the brain was normal, we also discussed options for acute management and I will prescribe nurtec.  HPI:  Carla Buck is a 39 y.o. female here as requested by Ladell Pier, MD for migraines. PMHx gastric bypass, hysterectomy, asthma, migraines.  She is on verapamil and Maxalt for her migraines.  She tried Imitrex in the past and did not work.  She also has propranolol 10 mg tablets by mouth as needed on her list which is sometimes used for acute management.  Maxalt is 5 mg.  She is on venlafaxine as well which can be used for migraines. She has had migraines for 6 years or longer. She has migraines 3  days a week, 15 headache days a month and 10 migraine days a month which are moderately severe or severe. She has severe migraines, nausea, she has to wear sunglasses, light and sound sensitivity, pulsating/pounding/throbbing. Ongoing at this severity and frequency for over a year. No medication overuse. Riztriptan alone does not help, tylenol helps sometimes. Movement makes it worse.  She wakes up with headaches, positional in quality worse in the morning when laying down, she gets blurry vision with the headaches. Migraines on the left side, starts slowly, can last 24-72 hours untreaed and be severe, no aura and no ,edictaion overuse.  No other focal neurologic deficits, associated symptoms, inciting events or modifiable factors.  Migraine medications tried: Verapamil, propranolol, venlafaxine, Maxalt, Imitrex, zofran  Reviewed notes, labs and imaging from outside physicians, which showed:   I reviewed Dr. Durenda Age notes.  Patient was recently seen for new patient visit.  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  She feels she is doing okay.  Occurs 3 times a week down from 6 migraines a week.  She tried Imitrex oral and nasal and neither work.  She is lost 62 pounds so far from her Roux-en-Y procedure in March of this year.  Recently seen for UTI in the emergency room, I reviewed those notes, started on a course of nitrofurantoin.  B12 658, CMP, CBC normal 06/18/2019  Review  of Systems: Patient complains of symptoms per HPI as well as the following symptoms: headache. Pertinent negatives and positives per HPI. All others negative.   Social History   Socioeconomic History  . Marital status: Married    Spouse name: Not on file  . Number of children: 3  . Years of education: Not on file  . Highest education level: Master's degree (e.g., MA, MS, MEng, MEd, MSW, MBA)  Occupational History  . Not on file    Tobacco Use  . Smoking status: Never Smoker  . Smokeless tobacco: Never Used  Substance and Sexual Activity  . Alcohol use: Not on file    Comment: occasional  . Drug use: Never  . Sexual activity: Not on file  Other Topics Concern  . Not on file  Social History Narrative   Lives at home with her children    Right handed   Caffeine: 2-3 cups/day   Social Determinants of Health   Financial Resource Strain:   . Difficulty of Paying Living Expenses: Not on file  Food Insecurity:   . Worried About Charity fundraiser in the Last Year: Not on file  . Ran Out of Food in the Last Year: Not on file  Transportation Needs:   . Lack of Transportation (Medical): Not on file  . Lack of Transportation (Non-Medical): Not on file  Physical Activity:   . Days of Exercise per Week: Not on file  . Minutes of Exercise per Session: Not on file  Stress:   . Feeling of Stress : Not on file  Social Connections:   . Frequency of Communication with Friends and Family: Not on file  . Frequency of Social Gatherings with Friends and Family: Not on file  . Attends Religious Services: Not on file  . Active Member of Clubs or Organizations: Not on file  . Attends Archivist Meetings: Not on file  . Marital Status: Not on file  Intimate Partner Violence:   . Fear of Current or Ex-Partner: Not on file  . Emotionally Abused: Not on file  . Physically Abused: Not on file  . Sexually Abused: Not on file    Family History  Problem Relation Age of Onset  . Hypertension Mother   . Allergic rhinitis Mother   . Asthma Mother   . Eczema Mother   . Depression Father   . Asthma Father   . Bipolar disorder Sister   . Asthma Sister   . Hypertension Maternal Grandmother   . Hypertension Paternal Grandmother   . Diabetes Paternal Grandmother   . Hypertension Paternal Grandfather   . Migraines Neg Hx     Past Medical History:  Diagnosis Date  . Allergy   . Anxiety   . Asthma   .  Depression   . Eczema   . Migraine   . Urticaria     Patient Active Problem List   Diagnosis Date Noted  . Allergic conjunctivitis 07/30/2019  . Food allergy 07/30/2019  . Chronic migraine without aura without status migrainosus, not intractable 07/02/2019  . Right knee pain 06/21/2019  . History of migraine 06/19/2019  . Moderate persistent asthma 06/19/2019  . Perennial and seasonal allergic rhinitis 06/19/2019  . Ganglion cyst of finger 06/19/2019  . Eczema 06/19/2019  . Obesity (BMI 30-39.9) 06/19/2019  . 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 Anxiety and depression 06/19/2019    Past Surgical History:  Procedure Laterality Date  .  ABDOMINAL HYSTERECTOMY  11/2015   fibroids  . BREAST LUMPECTOMY    . GASTRIC BYPASS    . OOPHORECTOMY Left 09/2018   torsion    Current Outpatient Medications  Medication Sig Dispense Refill  . acetaminophen (TYLENOL) 325 MG tablet Take 325 mg by mouth every 6 (six) hours as needed.    Marland Kitchen albuterol (VENTOLIN HFA) 108 (90 Base) MCG/ACT inhaler Inhale 2 puffs into the lungs every 6 (six) hours as needed for wheezing or shortness of breath. 18 g 1  . ALPRAZolam (XANAX) 0.25 MG tablet Take 1-2 tabs (0.25mg -0.50mg ) 30-60 minutes before procedure. May repeat if needed.Do not drive. (Patient not taking: Reported on 08/23/2019) 4 tablet 0  . Budeson-Glycopyrrol-Formoterol (BREZTRI AEROSPHERE) 160-9-4.8 MCG/ACT AERO Inhale 2 puffs into the lungs 2 (two) times daily. 10.7 g 5  . calcium carbonate (OSCAL) 1500 (600 Ca) MG TABS tablet Take 1,500 mg by mouth daily with breakfast.    . Crisaborole (EUCRISA) 2 % OINT Apply 1 application topically 2 (two) times daily. 100 g 5  . diphenhydrAMINE (BENADRYL) 25 mg capsule Take 25 mg by mouth every 6 (six) hours as needed.    Marland Kitchen EPINEPHrine (EPIPEN 2-PAK) 0.3 mg/0.3 mL IJ SOAJ injection Inject 0.3 mg into the muscle as needed for anaphylaxis.    Marland Kitchen ergocalciferol  (VITAMIN D2) 1.25 MG (50000 UT) capsule Take 50,000 Units by mouth once a week.    . ferrous sulfate 325 (65 FE) MG tablet Take 325 mg by mouth daily with breakfast.    . fexofenadine-pseudoephedrine (ALLEGRA-D 24) 180-240 MG 24 hr tablet Take 1 tablet by mouth daily. Rotates each month between Allegra and Xyzal.    . fluticasone (FLOVENT HFA) 220 MCG/ACT inhaler Inhale 2 puffs into the lungs 2 (two) times daily. 1 Inhaler 5  . fluticasone furoate-vilanterol (BREO ELLIPTA) 200-25 MCG/INH AEPB Inhale 1 puff into the lungs daily.    . Fluticasone Propionate (XHANCE) 93 MCG/ACT EXHU Place 2 sprays into the nose 2 (two) times daily. 32 mL 3  . Fremanezumab-vfrm (AJOVY) 225 MG/1.5ML SOAJ Inject 225 mg into the skin every 30 (thirty) days. 1 pen 11  . ipratropium-albuterol (DUONEB) 0.5-2.5 (3) MG/3ML SOLN Take 3 mLs by nebulization every 6 (six) hours as needed.    Marland Kitchen levocetirizine (XYZAL) 5 MG tablet Take 5 mg by mouth every evening.    . montelukast (SINGULAIR) 10 MG tablet Take 10 mg by mouth at bedtime.    . montelukast (SINGULAIR) 10 MG tablet Take 1 tablet (10 mg total) by mouth at bedtime. 30 tablet 5  . Multiple Vitamins-Minerals (BARIATRIC MULTIVITAMINS/IRON PO) Take 1 tablet by mouth.    . Olopatadine HCl (PATADAY) 0.2 % SOLN Place 1 drop into both eyes 2 (two) times daily. 2.5 mL 5  . ondansetron (ZOFRAN ODT) 4 MG disintegrating tablet Take 1 tablet (4 mg total) by mouth every 8 (eight) hours as needed for nausea or vomiting. 4 tablet 0  . propranolol (INDERAL) 10 MG tablet Take 10 mg by mouth as needed.    . Rimegepant Sulfate (NURTEC) 75 MG TBDP Take 75 mg by mouth daily as needed. For migraines. Take as close to onset of migraine as possible. One daily maximum. 10 tablet 6  . rizatriptan (MAXALT-MLT) 10 MG disintegrating tablet Take 1 tablet (10 mg total) by mouth as needed for migraine. May repeat in 2 hours if needed 9 tablet 11  . tamoxifen (NOLVADEX) 20 MG tablet Take 50 mg by mouth  daily.     Marland Kitchen  traZODone (DESYREL) 150 MG tablet Take 150 mg by mouth at bedtime as needed for sleep.    Marland Kitchen triamcinolone ointment (KENALOG) 0.1 % Apply 1 application topically 2 (two) times daily.    Marland Kitchen triamcinolone ointment (KENALOG) 0.1 % Apply 1 application topically 2 (two) times daily. 454 g 5  . venlafaxine (EFFEXOR) 100 MG tablet Take 100 mg by mouth 2 (two) times daily.    . verapamil (CALAN) 80 MG tablet Take 1 tablet (80 mg total) by mouth 3 (three) times daily. 270 tablet 3   No current facility-administered medications for this visit.    Allergies as of 10/03/2019 - Review Complete 09/12/2019  Allergen Reaction Noted  . Bee venom Anaphylaxis 06/18/2019  . Peanut-containing drug products Anaphylaxis 05/14/2019  . Pollen extract Shortness Of Breath 06/18/2019  . Shellfish allergy Anaphylaxis 05/14/2019  . Wasp venom Anaphylaxis 06/18/2019  . Xolair [omalizumab] Anaphylaxis 05/14/2019  . Amoxicillin Hives 06/18/2019  . Bug itch releaf [misc natural products] Hives 06/18/2019  . Contrave [naltrexone-bupropion hcl er] Hives 06/18/2019  . Corn-containing products  06/18/2019  . Dust mite extract  06/19/2019  . Gluten meal  05/14/2019  . Iodine Hives 05/14/2019  . Lidocaine Hives 05/14/2019  . Orange fruit [citrus] Hives 05/14/2019  . Penicillins Hives 05/14/2019  . Sesame oil Diarrhea 06/18/2019  . Soy allergy Hives 05/14/2019  . Tetracaine Hives 06/18/2019  . Tomato Hives 05/14/2019    Vitals: There were no vitals taken for this visit. Last Weight:  Wt Readings from Last 1 Encounters:  08/23/19 183 lb (83 kg)   Last Height:   Ht Readings from Last 1 Encounters:  08/23/19 5\' 5"  (1.651 m)     Physical exam: Exam: Gen: NAD, conversant, well nourised, overweight,  well groomed                     CV: RRR, no MRG. No Carotid Bruits. No peripheral edema, warm, nontender Eyes: Conjunctivae clear without exudates or hemorrhage  Neuro: Detailed Neurologic  Exam  Speech:    Speech is normal; fluent and spontaneous with normal comprehension.  Cognition:    The patient is oriented to person, place, and time;     recent and remote memory intact;     language fluent;     normal attention, concentration,     fund of knowledge Cranial Nerves:    The pupils are equal, round, and reactive to light. The fundi are normal and spontaneous venous pulsations are present. Visual fields are full to finger confrontation. Extraocular movements are intact. Trigeminal sensation is intact and the muscles of mastication are normal. The face is symmetric. The palate elevates in the midline. Hearing intact. Voice is normal. Shoulder shrug is normal. The tongue has normal motion without fasciculations.   Coordination:    Normal finger to nose and heel to shin. Normal rapid alternating movements.   Gait:    Heel-toe and tandem gait are normal.   Motor Observation:    No asymmetry, no atrophy, and no involuntary movements noted. Tone:    Normal muscle tone.    Posture:    Posture is normal. normal erect    Strength:    Strength is V/V in the upper and lower limbs.      Sensation: intact to LT     Reflex Exam:  DTR's:    Deep tendon reflexes in the upper and lower extremities are normal bilaterally.   Toes:    The toes are downgoing  bilaterally.   Clonus:    Clonus is absent.    Assessment/Plan:  Patient with chronic migraines.However given concerning symptoms need thorough evaluation.   MRI brain normal, took ajovy time to work but doing well now will continue and she will email me in a few months to ensure still doing well. Discussed options for acute management, will order nurtec.   PRIOR:  Failed multiple medication classes. Discussed, start Ajovy. She had a reaction to Xolair, we discussed risks, she has epi pen advised to take with someone present.  Acute: maxalt and zofran MRI brain due to concerning symptoms of morning headaches,  positional headaches,vision changes  to look for space occupying mass, chiari or intracranial hypertension (pseudotumor).  No orders of the defined types were placed in this encounter.  Meds ordered this encounter  Medications  . Rimegepant Sulfate (NURTEC) 75 MG TBDP    Sig: Take 75 mg by mouth daily as needed. For migraines. Take as close to onset of migraine as possible. One daily maximum.    Dispense:  10 tablet    Refill:  6    Patient has copay card; she can have medication regardless of insurance approval or copay amount.    Cc: Ladell Pier, MD,  Ladell Pier, MD  Sarina Ill, MD  [facility] [address] [place], [place] [address]  Phone [phone] Fax [phone]

## 2019-10-03 ENCOUNTER — Telehealth (INDEPENDENT_AMBULATORY_CARE_PROVIDER_SITE_OTHER): Payer: BC Managed Care – PPO | Admitting: Neurology

## 2019-10-03 ENCOUNTER — Other Ambulatory Visit: Payer: Self-pay

## 2019-10-03 DIAGNOSIS — G43709 Chronic migraine without aura, not intractable, without status migrainosus: Secondary | ICD-10-CM | POA: Diagnosis not present

## 2019-10-04 ENCOUNTER — Encounter: Payer: Self-pay | Admitting: Neurology

## 2019-10-04 MED ORDER — NURTEC 75 MG PO TBDP: 75.0000 mg | ORAL_TABLET | Freq: Every day | ORAL | 6 refills | Status: DC | PRN

## 2019-10-05 ENCOUNTER — Encounter: Payer: Self-pay | Admitting: Internal Medicine

## 2019-10-05 MED ORDER — VALACYCLOVIR HCL 500 MG PO TABS: 500.0000 mg | ORAL_TABLET | Freq: Two times a day (BID) | ORAL | 1 refills | Status: DC

## 2019-10-10 ENCOUNTER — Ambulatory Visit (INDEPENDENT_AMBULATORY_CARE_PROVIDER_SITE_OTHER): Payer: BC Managed Care – PPO | Admitting: *Deleted

## 2019-10-10 DIAGNOSIS — J309 Allergic rhinitis, unspecified: Secondary | ICD-10-CM | POA: Diagnosis not present

## 2019-10-15 ENCOUNTER — Ambulatory Visit: Payer: BC Managed Care – PPO | Admitting: Allergy and Immunology

## 2019-10-18 ENCOUNTER — Encounter: Payer: Self-pay | Admitting: Internal Medicine

## 2019-10-18 ENCOUNTER — Ambulatory Visit: Payer: BC Managed Care – PPO | Admitting: Internal Medicine

## 2019-10-19 ENCOUNTER — Ambulatory Visit: Payer: BC Managed Care – PPO | Attending: Internal Medicine | Admitting: Internal Medicine

## 2019-10-19 ENCOUNTER — Other Ambulatory Visit: Payer: Self-pay

## 2019-10-19 ENCOUNTER — Encounter: Payer: Self-pay | Admitting: Internal Medicine

## 2019-10-19 ENCOUNTER — Ambulatory Visit (INDEPENDENT_AMBULATORY_CARE_PROVIDER_SITE_OTHER): Payer: BC Managed Care – PPO | Admitting: *Deleted

## 2019-10-19 ENCOUNTER — Other Ambulatory Visit: Payer: Self-pay | Admitting: Internal Medicine

## 2019-10-19 VITALS — BP 127/86 | HR 72 | Temp 97.9°F | Resp 16 | Wt 186.6 lb

## 2019-10-19 DIAGNOSIS — D242 Benign neoplasm of left breast: Secondary | ICD-10-CM

## 2019-10-19 DIAGNOSIS — Z87440 Personal history of urinary (tract) infections: Secondary | ICD-10-CM | POA: Diagnosis not present

## 2019-10-19 DIAGNOSIS — D241 Benign neoplasm of right breast: Secondary | ICD-10-CM | POA: Diagnosis not present

## 2019-10-19 DIAGNOSIS — A6004 Herpesviral vulvovaginitis: Secondary | ICD-10-CM

## 2019-10-19 DIAGNOSIS — Z113 Encounter for screening for infections with a predominantly sexual mode of transmission: Secondary | ICD-10-CM

## 2019-10-19 DIAGNOSIS — J309 Allergic rhinitis, unspecified: Secondary | ICD-10-CM | POA: Diagnosis not present

## 2019-10-19 MED ORDER — ACYCLOVIR 400 MG PO TABS: 400.0000 mg | ORAL_TABLET | Freq: Two times a day (BID) | ORAL | 5 refills | Status: DC

## 2019-10-19 MED ORDER — ACYCLOVIR 400 MG PO TABS: 400.0000 mg | ORAL_TABLET | Freq: Three times a day (TID) | ORAL | 0 refills | Status: DC

## 2019-10-19 NOTE — Progress Notes (Signed)
Patient ID: Carla Buck, female    DOB: 1981-05-12  MRN: [id number]  CC: inguinal lymphadenopathy  Subjective: Carla Buck is a 39 y.o. female who presents for UC visit Her concerns today include:  Patient with history of asthma,  Allergies (food allergies and environmental allergies), papilloma (2 RT/1 LT breast), dep/anx/insomnia (followed by Dr. 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  I sent the prescription to her pharmacy which she has completed.  With completion of the medication she reports that the sores went away but the inguinal lymph nodes have not.  Initially the lymph nodes were painful.  The soreness is gone away but lymph nodes are still swollen.  She reports that this is the third outbreak of herpes since October of last year  In the past several days she has noted some soreness in the vaginal area and found a few bumps on the left labia.  She has had mild itching.  She is not sure whether she is having vaginal discharge as she is currently this is a vaginal moisturizer Ravaree that was prescribed for her by urology.  She is sexually active with one partner.  She would like STD screening.  She saw urology recurrent UTIs about 4 weeks ago.  She was prescribed nitrofurantoin to use after sexual intercourse.  She was also prescribed an vaginal moisturizer to use every 2 to 3 days.  She continues to follow-up with oncology/breast center in [place].  She had a recent MRI mammography that found a new area in the right breast for which she will need biopsy.  Patient Active Problem List   Diagnosis Date Noted  . History of recurrent UTIs 10/19/2019  . Allergic conjunctivitis 07/30/2019  . Food allergy 07/30/2019  . Chronic migraine without aura without status migrainosus, not  intractable 07/02/2019  . Right knee pain 06/21/2019  . History of migraine 06/19/2019  . Moderate persistent asthma 06/19/2019  . Perennial and seasonal allergic rhinitis 06/19/2019  . Ganglion cyst of finger 06/19/2019  . Eczema 06/19/2019  . Obesity (BMI 30-39.9) 06/19/2019  . 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 Anxiety and depression 06/19/2019     Current Outpatient Medications on File Prior to Visit  Medication Sig Dispense Refill  . acetaminophen (TYLENOL) 325 MG tablet Take 325 mg by mouth every 6 (six) hours as needed.    Marland Kitchen albuterol (VENTOLIN HFA) 108 (90 Base) MCG/ACT inhaler Inhale 2 puffs into the lungs every 6 (six) hours as needed for wheezing or shortness of breath. 18 g 1  . ALPRAZolam (XANAX) 0.25 MG tablet Take 1-2 tabs (0.25mg -0.50mg ) 30-60 minutes before procedure. May repeat if needed.Do not drive. (Patient not taking: Reported on 08/23/2019) 4 tablet 0  . Budeson-Glycopyrrol-Formoterol (BREZTRI AEROSPHERE) 160-9-4.8 MCG/ACT AERO Inhale 2 puffs into the lungs 2 (two) times daily. 10.7 g 5  . calcium carbonate (OSCAL) 1500 (600 Ca) MG TABS tablet Take 1,500 mg by mouth daily with breakfast.    . Crisaborole (EUCRISA) 2 % OINT Apply 1 application topically 2 (two) times daily. 100 g 5  . diphenhydrAMINE (BENADRYL) 25 mg capsule Take 25 mg by mouth every 6 (six) hours as needed.    Marland Kitchen EPINEPHrine (EPIPEN 2-PAK) 0.3 mg/0.3 mL IJ SOAJ injection Inject  0.3 mg into the muscle as needed for anaphylaxis.    Marland Kitchen ergocalciferol (VITAMIN D2) 1.25 MG (50000 UT) capsule Take 50,000 Units by mouth once a week.    . ferrous sulfate 325 (65 FE) MG tablet Take 325 mg by mouth daily with breakfast.    . fexofenadine-pseudoephedrine (ALLEGRA-D 24) 180-240 MG 24 hr tablet Take 1 tablet by mouth daily. Rotates each month between Allegra and Xyzal.    . fluticasone (FLOVENT HFA) 220 MCG/ACT inhaler Inhale 2 puffs into the lungs 2 (two) times  daily. 1 Inhaler 5  . fluticasone furoate-vilanterol (BREO ELLIPTA) 200-25 MCG/INH AEPB Inhale 1 puff into the lungs daily.    . Fluticasone Propionate (XHANCE) 93 MCG/ACT EXHU Place 2 sprays into the nose 2 (two) times daily. 32 mL 3  . Fremanezumab-vfrm (AJOVY) 225 MG/1.5ML SOAJ Inject 225 mg into the skin every 30 (thirty) days. 1 pen 11  . ipratropium-albuterol (DUONEB) 0.5-2.5 (3) MG/3ML SOLN Take 3 mLs by nebulization every 6 (six) hours as needed.    Marland Kitchen levocetirizine (XYZAL) 5 MG tablet Take 5 mg by mouth every evening.    . montelukast (SINGULAIR) 10 MG tablet Take 10 mg by mouth at bedtime.    . montelukast (SINGULAIR) 10 MG tablet Take 1 tablet (10 mg total) by mouth at bedtime. 30 tablet 5  . Multiple Vitamins-Minerals (BARIATRIC MULTIVITAMINS/IRON PO) Take 1 tablet by mouth.    . nitrofurantoin (MACRODANTIN) 50 MG capsule Take 50 mg by mouth daily.    . Olopatadine HCl (PATADAY) 0.2 % SOLN Place 1 drop into both eyes 2 (two) times daily. 2.5 mL 5  . ondansetron (ZOFRAN ODT) 4 MG disintegrating tablet Take 1 tablet (4 mg total) by mouth every 8 (eight) hours as needed for nausea or vomiting. 4 tablet 0  . propranolol (INDERAL) 10 MG tablet Take 10 mg by mouth as needed.    . Rimegepant Sulfate (NURTEC) 75 MG TBDP Take 75 mg by mouth daily as needed. For migraines. Take as close to onset of migraine as possible. One daily maximum. 10 tablet 6  . rizatriptan (MAXALT-MLT) 10 MG disintegrating tablet Take 1 tablet (10 mg total) by mouth as needed for migraine. May repeat in 2 hours if needed 9 tablet 11  . tamoxifen (NOLVADEX) 20 MG tablet Take 50 mg by mouth daily.     . traZODone (DESYREL) 150 MG tablet Take 150 mg by mouth at bedtime as needed for sleep.    Marland Kitchen triamcinolone ointment (KENALOG) 0.1 % Apply 1 application topically 2 (two) times daily.    Marland Kitchen triamcinolone ointment (KENALOG) 0.1 % Apply 1 application topically 2 (two) times daily. 454 g 5  . valACYclovir (VALTREX) 500 MG tablet  Take 1 tablet (500 mg total) by mouth 2 (two) times daily. 6 tablet 1  . venlafaxine (EFFEXOR) 100 MG tablet Take 100 mg by mouth 2 (two) times daily.    . verapamil (CALAN) 80 MG tablet Take 1 tablet (80 mg total) by mouth 3 (three) times daily. 270 tablet 3   No current facility-administered medications on file prior to visit.    Allergies  Allergen Reactions  . Bee Venom Anaphylaxis  . Peanut-Containing Drug Products Anaphylaxis  . Pollen Extract Shortness Of Breath    Tree and grass   . Shellfish Allergy Anaphylaxis  . Wasp Venom Anaphylaxis  . Xolair [Omalizumab] Anaphylaxis  . Amoxicillin Hives  . Bug Itch Releaf [Misc Natural Products] Hives    Roaches, ants and dustmites  .  Contrave [Naltrexone-Bupropion Hcl Er] Hives  . Corn-Containing Products     GI unset  . Dust Mite Extract     Asthma trigger  . Gluten Meal     GI upset, HIVeS  . Iodine Hives  . Lidocaine Hives  . Orange Fruit [Citrus] Hives  . Penicillins Hives  . Sesame Oil Diarrhea    GI upset  . Soy Allergy Hives    GI upset  . Tetracaine Hives  . Tomato Hives    Social History   Socioeconomic History  . Marital status: Married    Spouse name: Not on file  . Number of children: 3  . Years of education: Not on file  . Highest education level: Master's degree (e.g., MA, MS, MEng, MEd, MSW, MBA)  Occupational History  . Not on file  Tobacco Use  . Smoking status: Never Smoker  . Smokeless tobacco: Never Used  Substance and Sexual Activity  . Alcohol use: Not on file    Comment: occasional  . Drug use: Never  . Sexual activity: Not on file  Other Topics Concern  . Not on file  Social History Narrative   Lives at home with her children    Right handed   Caffeine: 2-3 cups/day   Social Determinants of Health   Financial Resource Strain:   . Difficulty of Paying Living Expenses: Not on file  Food Insecurity:   . Worried About Charity fundraiser in the Last Year: Not on file  . Ran Out of  Food in the Last Year: Not on file  Transportation Needs:   . Lack of Transportation (Medical): Not on file  . Lack of Transportation (Non-Medical): Not on file  Physical Activity:   . Days of Exercise per Week: Not on file  . Minutes of Exercise per Session: Not on file  Stress:   . Feeling of Stress : Not on file  Social Connections:   . Frequency of Communication with Friends and Family: Not on file  . Frequency of Social Gatherings with Friends and Family: Not on file  . Attends Religious Services: Not on file  . Active Member of Clubs or Organizations: Not on file  . Attends Archivist Meetings: Not on file  . Marital Status: Not on file  Intimate Partner Violence:   . Fear of Current or Ex-Partner: Not on file  . Emotionally Abused: Not on file  . Physically Abused: Not on file  . Sexually Abused: Not on file    Family History  Problem Relation Age of Onset  . Hypertension Mother   . Allergic rhinitis Mother   . Asthma Mother   . Eczema Mother   . Depression Father   . Asthma Father   . Bipolar disorder Sister   . Asthma Sister   . Hypertension Maternal Grandmother   . Hypertension Paternal Grandmother   . Diabetes Paternal Grandmother   . Hypertension Paternal Grandfather   . Migraines Neg Hx     Past Surgical History:  Procedure Laterality Date  . ABDOMINAL HYSTERECTOMY  11/2015   fibroids  . BREAST LUMPECTOMY    . GASTRIC BYPASS    . OOPHORECTOMY Left 09/2018   torsion    ROS: Review of Systems Negative except as stated above  PHYSICAL EXAM: BP 127/86   Pulse 72   Temp 97.9 F (36.6 C)   Resp 16   Wt 186 lb 9.6 oz (84.6 kg)   SpO2 99%   BMI  31.05 kg/m   Physical Exam  General appearance - alert, well appearing, and in no distress Mental status - normal mood, behavior, speech, dress, motor activity, and thought processes Pelvic -CMA Sallyanne Havers present: Patient has several shallow ulcers noted on the perineal skin as below the  introitus.  These are exquisitely tender to touch.  These ulcers were swabbed and sent for HSV culture.  She has few fine bumps noted on the left labia minora.  2 pea-sized lymph node felt in the right inguinal area and a larger 1 in the left inguinal area.  Speculum was not introduced into the vaginal vault   CMP Latest Ref Rng & Units 06/18/2019 03/01/2010  Glucose 65 - 99 mg/dL 84 93  BUN 6 - 20 mg/dL 15 7  Creatinine 0.57 - 1.00 mg/dL 0.66 0.7  Sodium 134 - 144 mmol/L 138 140  Potassium 3.5 - 5.2 mmol/L 4.4 4.2  Chloride 96 - 106 mmol/L 101 106  CO2 20 - 29 mmol/L 26 26  Calcium 8.7 - 10.2 mg/dL 8.8 8.9  Total Protein 6.0 - 8.5 g/dL 6.8 7.5  Total Bilirubin 0.0 - 1.2 mg/dL 0.2 0.7  Alkaline Phos 39 - 117 IU/L 60 66  AST 0 - 40 IU/L 20 26  ALT 0 - 32 IU/L 19 17   Lipid Panel     Component Value Date/Time   CHOL 153 06/18/2019 1507   TRIG 52 06/18/2019 1507   HDL 72 06/18/2019 1507   CHOLHDL 2.1 06/18/2019 1507   LDLCALC 70 06/18/2019 1507    CBC    Component Value Date/Time   WBC 6.3 06/18/2019 1507   WBC 6.4 03/01/2010 1235   RBC 4.47 06/18/2019 1507   RBC 4.53 03/01/2010 1235   HGB 13.5 06/18/2019 1507   HCT 42.3 06/18/2019 1507   PLT 296 06/18/2019 1507   MCV 95 06/18/2019 1507   MCH 30.2 06/18/2019 1507   MCH 29.0 03/01/2010 1235   MCHC 31.9 06/18/2019 1507   MCHC 33.3 03/01/2010 1235   RDW 13.9 06/18/2019 1507   LYMPHSABS 1.8 03/01/2010 1235   MONOABS 0.3 03/01/2010 1235   EOSABS 0.1 03/01/2010 1235   BASOSABS 0.1 03/01/2010 1235    ASSESSMENT AND PLAN: 1. Herpes simplex vulvovaginitis This appears to be recurrence of herpes outbreak. Advised patient to not engage in sexual intimacy until this outbreak has resolved. We will treat with acyclovir 3 times a day for 5 days then continue with suppressive therapy with twice daily dosing. -We have sent for herpes simplex culture.  Patient also did a self swab which was sent for GC, chlamydia and trichomonas.   Screening test for HIV and syphilis sent. - acyclovir (ZOVIRAX) 400 MG tablet; Take 1 tablet (400 mg total) by mouth 3 (three) times daily.  Dispense: 15 tablet; Refill: 0 - acyclovir (ZOVIRAX) 400 MG tablet; Take 1 tablet (400 mg total) by mouth 2 (two) times daily.  Dispense: 60 tablet; Refill: 5 - Herpes Simplex Virus Culture  2. Papilloma of both breasts Patient reports a new lesion has been discovered in the right breast for which she will need biopsy  3. History of recurrent UTIs She has seen urology.  She uses nitrofurantoin prophylactically post intercourse to prevent recurrent infections    Patient was given the opportunity to ask questions.  Patient verbalized understanding of the plan and was able to repeat key elements of the plan.   Orders Placed This Encounter  Procedures  . Herpes Simplex Virus Culture  Requested Prescriptions   Signed Prescriptions Disp Refills  . acyclovir (ZOVIRAX) 400 MG tablet 15 tablet 0    Sig: Take 1 tablet (400 mg total) by mouth 3 (three) times daily.  Marland Kitchen acyclovir (ZOVIRAX) 400 MG tablet 60 tablet 5    Sig: Take 1 tablet (400 mg total) by mouth 2 (two) times daily.    No follow-ups on file.  [name], MD, FACP

## 2019-10-19 NOTE — Progress Notes (Signed)
Pt states her lymph nodes are swollen  Pt states she intercourse with her boyfriend on Monday and she got she split and it hasn't healed

## 2019-10-19 NOTE — Patient Instructions (Signed)

## 2019-10-20 LAB — RPR: RPR Ser Ql: NONREACTIVE

## 2019-10-20 LAB — SPECIMEN STATUS REPORT

## 2019-10-20 LAB — HIV ANTIBODY (ROUTINE TESTING W REFLEX): HIV Screen 4th Generation wRfx: NONREACTIVE

## 2019-10-22 ENCOUNTER — Other Ambulatory Visit: Payer: Self-pay | Admitting: Internal Medicine

## 2019-10-22 LAB — CERVICOVAGINAL ANCILLARY ONLY
Bacterial Vaginitis (gardnerella): NEGATIVE
Candida Glabrata: NEGATIVE
Candida Vaginitis: POSITIVE — AB
Chlamydia: NEGATIVE
Comment: NEGATIVE
Comment: NEGATIVE
Comment: NEGATIVE
Comment: NEGATIVE
Comment: NEGATIVE
Comment: NORMAL
Neisseria Gonorrhea: NEGATIVE
Trichomonas: NEGATIVE

## 2019-10-22 MED ORDER — FLUCONAZOLE 150 MG PO TABS: 150.0000 mg | ORAL_TABLET | Freq: Once | ORAL | 0 refills | Status: AC

## 2019-10-23 ENCOUNTER — Ambulatory Visit: Payer: BC Managed Care – PPO | Admitting: Internal Medicine

## 2019-10-23 ENCOUNTER — Ambulatory Visit (INDEPENDENT_AMBULATORY_CARE_PROVIDER_SITE_OTHER): Payer: BC Managed Care – PPO

## 2019-10-23 DIAGNOSIS — J309 Allergic rhinitis, unspecified: Secondary | ICD-10-CM

## 2019-10-23 LAB — HERPES SIMPLEX VIRUS CULTURE

## 2019-10-26 ENCOUNTER — Ambulatory Visit (INDEPENDENT_AMBULATORY_CARE_PROVIDER_SITE_OTHER): Payer: BC Managed Care – PPO

## 2019-10-26 DIAGNOSIS — J309 Allergic rhinitis, unspecified: Secondary | ICD-10-CM

## 2019-10-31 ENCOUNTER — Ambulatory Visit (INDEPENDENT_AMBULATORY_CARE_PROVIDER_SITE_OTHER): Payer: BC Managed Care – PPO

## 2019-10-31 DIAGNOSIS — J309 Allergic rhinitis, unspecified: Secondary | ICD-10-CM | POA: Diagnosis not present

## 2019-11-08 ENCOUNTER — Telehealth: Payer: Self-pay | Admitting: *Deleted

## 2019-11-08 ENCOUNTER — Ambulatory Visit (INDEPENDENT_AMBULATORY_CARE_PROVIDER_SITE_OTHER): Payer: BC Managed Care – PPO

## 2019-11-08 ENCOUNTER — Encounter: Payer: Self-pay | Admitting: *Deleted

## 2019-11-08 DIAGNOSIS — J309 Allergic rhinitis, unspecified: Secondary | ICD-10-CM

## 2019-11-08 NOTE — Telephone Encounter (Addendum)
Per [facility] [employer], Nurtec approved from 11/08/19-11/07/20. Messaged pt in [place]. Faxed pharmacy. Received a receipt of confirmation.

## 2019-11-08 NOTE — Telephone Encounter (Signed)
Nurtec PA completed on Cover My Meds. Key: [id number]. Awaiting [employer] [employer] determination.

## 2019-11-14 HISTORY — PX: BREAST LUMPECTOMY: SHX2

## 2019-11-15 ENCOUNTER — Ambulatory Visit (INDEPENDENT_AMBULATORY_CARE_PROVIDER_SITE_OTHER): Payer: BC Managed Care – PPO | Admitting: *Deleted

## 2019-11-15 DIAGNOSIS — J309 Allergic rhinitis, unspecified: Secondary | ICD-10-CM

## 2019-11-19 ENCOUNTER — Encounter: Payer: Self-pay | Admitting: Allergy and Immunology

## 2019-11-19 ENCOUNTER — Other Ambulatory Visit: Payer: Self-pay

## 2019-11-19 ENCOUNTER — Ambulatory Visit: Payer: BC Managed Care – PPO | Admitting: Allergy and Immunology

## 2019-11-19 VITALS — BP 126/88 | HR 76 | Temp 97.7°F | Resp 18 | Ht 65.0 in | Wt 188.4 lb

## 2019-11-19 DIAGNOSIS — H1013 Acute atopic conjunctivitis, bilateral: Secondary | ICD-10-CM

## 2019-11-19 DIAGNOSIS — J4540 Moderate persistent asthma, uncomplicated: Secondary | ICD-10-CM | POA: Diagnosis not present

## 2019-11-19 DIAGNOSIS — J309 Allergic rhinitis, unspecified: Secondary | ICD-10-CM

## 2019-11-19 DIAGNOSIS — T7800XA Anaphylactic reaction due to unspecified food, initial encounter: Secondary | ICD-10-CM

## 2019-11-19 DIAGNOSIS — J3089 Other allergic rhinitis: Secondary | ICD-10-CM

## 2019-11-19 DIAGNOSIS — L308 Other specified dermatitis: Secondary | ICD-10-CM

## 2019-11-19 MED ORDER — LEVOCETIRIZINE DIHYDROCHLORIDE 5 MG PO TABS: 5.0000 mg | ORAL_TABLET | Freq: Every evening | ORAL | 5 refills | Status: DC

## 2019-11-19 NOTE — Assessment & Plan Note (Addendum)
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  A laboratory order has been provided for serum specific IgE against pumpkin seed, sunflower seed, coconut, and sesame seed.  Until seed allergy has been definitively ruled out, avoid seeds.  In addition, continue meticulous avoidance of shellfish, fish, peanuts, tree nuts, soy, and any other foods which cause untoward symptoms, and have access to epinephrine autoinjector 2 pack in case of accidental ingestion.  Food allergy action plan is in place.

## 2019-11-19 NOTE — Assessment & Plan Note (Signed)
Currently with suboptimal control.   A laboratory order form has been provided for CBC with differential to assess candidacy for biologic agents.  We will not check an IgE level because Xolair is contraindicated due to previous anaphylactic reaction to this agent.  Increase BrezTri 160 g, to 2 inhalations via spacer device twice a day.   During respiratory tract infections or asthma flares, add Flovent 220g 2 inhalations 2 times per day until symptoms have returned to baseline.  For now, continue montelukast 10 mg daily, DuoNeb as needed, and albuterol as needed and 15 minutes prior to exercise.  Subjective and objective measures of pulmonary function will be followed and the treatment plan will be adjusted accordingly.

## 2019-11-19 NOTE — Assessment & Plan Note (Signed)
   Continue appropriate skin care measures, Eucrisa as needed, and/or triamcinolone ointment as needed.  Triamcinolone is not to be used on the face, neck, axillae, or groin area.

## 2019-11-19 NOTE — Patient Instructions (Addendum)
Food allergy The patient's history suggests an allergic reaction to pumpkin seed.  A laboratory order has been provided for serum specific IgE against pumpkin seed, sunflower seed, coconut, and sesame seed.  Until seed allergy has been definitively ruled out, avoid seeds.  In addition, continue meticulous avoidance of shellfish, fish, peanuts, tree nuts, soy, and any other foods which cause untoward symptoms, and have access to epinephrine autoinjector 2 pack in case of accidental ingestion.  Food allergy action plan is in place.  Moderate persistent asthma Currently with suboptimal control.   A laboratory order form has been provided for CBC with differential to assess candidacy for biologic agents.  We will not check an IgE level because Xolair is contraindicated due to previous anaphylactic reaction to this agent.  Increase BrezTri 160 g, to 2 inhalations via spacer device twice a day.   During respiratory tract infections or asthma flares, add Flovent 220g 2 inhalations 2 times per day until symptoms have returned to baseline.  For now, continue montelukast 10 mg daily, DuoNeb as needed, and albuterol as needed and 15 minutes prior to exercise.  Subjective and objective measures of pulmonary function will be followed and the treatment plan will be adjusted accordingly.  Perennial and seasonal allergic rhinitis  Continue appropriate allergen avoidance measures, immunotherapy injections per protocol, Xhance as needed, and levocetirizine as needed.  Nasal saline spray (i.e., Simply Saline) or nasal saline lavage (i.e., NeilMed) is recommended as needed and prior to medicated nasal sprays.  Medications will be decreased or discontinued as symptom relief from immunotherapy becomes evident.  Allergic conjunctivitis  Treatment plan as outlined above for allergic rhinitis.  Continue Pataday, one drop per eye daily as needed.  I have also recommended eye lubricant drops (i.e.,  Natural Tears) as needed.  Eczema  Continue appropriate skin care measures, Eucrisa as needed, and/or triamcinolone ointment as needed.  Triamcinolone is not to be used on the face, neck, axillae, or groin area.   Return in about 3 months (around 02/18/2020), or if symptoms worsen or fail to improve.

## 2019-11-19 NOTE — Assessment & Plan Note (Signed)
   Continue appropriate allergen avoidance measures, immunotherapy injections per protocol, Xhance as needed, and levocetirizine as needed.  Nasal saline spray (i.e., Simply Saline) or nasal saline lavage (i.e., NeilMed) is recommended as needed and prior to medicated nasal sprays.  Medications will be decreased or discontinued as symptom relief from immunotherapy becomes evident.

## 2019-11-19 NOTE — Progress Notes (Signed)
Follow-up Note  RE: LEGACY KUZNIK MRN: [id number] DOB: 1981-05-16 Date of Office Visit: 11/19/2019  Primary care provider: Ladell Pier, MD Referring provider: Ladell Pier, MD  History of present illness: Carla Buck is a 39 y.o. female with persistent asthma, allergic rhinoconjunctivitis, and atopic dermatitis, and food allergies presenting today for follow-up.  She was previously seen in this clinic for her initial evaluation in December 2020.  She reports that she had 3 asthma exacerbations in January and February.  One of the asthma exacerbation she believes was triggered by the consumption of pumpkin seeds.  She reports that on previous occasions she has developed hand and arm swelling when she has come in the contact with pumpkin while carving pumpkins with her children.  Therefore, she typically will protective gloves when carving pumpkins.  The other 2 asthma exacerbations occurred without any identified triggers.  She reports that she is taking BrezTri 2 inhalations once daily.  She tried Xolair in 2013 but had an anaphylactic reaction "requiring multiple rounds of epinephrine." She reports that her nasal allergy symptoms have improved with Xhance nasal spray and that Pataday has provided "great" benefit regarding allergic conjunctivitis. She reports that her eczema has improved regarding the rash and pruritus, however she still has some scarring on areas she has scratched in the past.  Assessment and plan: Food allergy The patient's history suggests an allergic reaction to pumpkin seed.  A laboratory order has been provided for serum specific IgE against pumpkin seed, sunflower seed, coconut, and sesame seed.  Until seed allergy has been definitively ruled out, avoid seeds.  In addition, continue meticulous avoidance of shellfish, fish, peanuts, tree nuts, soy, and any other foods which cause untoward symptoms, and have access to epinephrine autoinjector 2 pack in  case of accidental ingestion.  Food allergy action plan is in place.  Moderate persistent asthma Currently with suboptimal control.   A laboratory order form has been provided for CBC with differential to assess candidacy for biologic agents.  We will not check an IgE level because Xolair is contraindicated due to previous anaphylactic reaction to this agent.  Increase BrezTri 160 g, to 2 inhalations via spacer device twice a day.   During respiratory tract infections or asthma flares, add Flovent 220g 2 inhalations 2 times per day until symptoms have returned to baseline.  For now, continue montelukast 10 mg daily, DuoNeb as needed, and albuterol as needed and 15 minutes prior to exercise.  Subjective and objective measures of pulmonary function will be followed and the treatment plan will be adjusted accordingly.  Perennial and seasonal allergic rhinitis  Continue appropriate allergen avoidance measures, immunotherapy injections per protocol, Xhance as needed, and levocetirizine as needed.  Nasal saline spray (i.e., Simply Saline) or nasal saline lavage (i.e., NeilMed) is recommended as needed and prior to medicated nasal sprays.  Medications will be decreased or discontinued as symptom relief from immunotherapy becomes evident.  Allergic conjunctivitis  Treatment plan as outlined above for allergic rhinitis.  Continue Pataday, one drop per eye daily as needed.  I have also recommended eye lubricant drops (i.e., Natural Tears) as needed.  Eczema  Continue appropriate skin care measures, Eucrisa as needed, and/or triamcinolone ointment as needed.  Triamcinolone is not to be used on the face, neck, axillae, or groin area.   Meds ordered this encounter  Medications  . levocetirizine (XYZAL) 5 MG tablet    Sig: Take 1 tablet (5 mg total) by mouth every  evening.    Dispense:  30 tablet    Refill:  5    Diagnostics: Spirometry:  Normal with an FEV1 of 104% predicted. This  study was performed while the patient was asymptomatic.  Please see scanned spirometry results for details.    Physical examination: Blood pressure 126/88, pulse 76, temperature 97.7 F (36.5 C), temperature source Temporal, resp. rate 18, height 5\' 5"  (1.651 m), weight 188 lb 6.4 oz (85.5 kg), SpO2 96 %.  General: Alert, interactive, in no acute distress. HEENT: TMs pearly gray, turbinates mildly edematous without discharge, post-pharynx mildly erythematous. Neck: Supple without lymphadenopathy. Lungs: Clear to auscultation without wheezing, rhonchi or rales. CV: Normal S1, S2 without murmurs. Skin: Warm and dry, without lesions or rashes.  The following portions of the patient's history were reviewed and updated as appropriate: allergies, current medications, past family history, past medical history, past social history, past surgical history and problem list.  Current Outpatient Medications  Medication Sig Dispense Refill  . albuterol (VENTOLIN HFA) 108 (90 Base) MCG/ACT inhaler Inhale 2 puffs into the lungs every 6 (six) hours as needed for wheezing or shortness of breath. 18 g 1  . Budeson-Glycopyrrol-Formoterol (BREZTRI AEROSPHERE) 160-9-4.8 MCG/ACT AERO Inhale 2 puffs into the lungs 2 (two) times daily. 10.7 g 5  . Crisaborole (EUCRISA) 2 % OINT Apply 1 application topically 2 (two) times daily. 100 g 5  . diphenhydrAMINE (BENADRYL) 25 mg capsule Take 25 mg by mouth every 6 (six) hours as needed.    Marland Kitchen EPINEPHrine (EPIPEN 2-PAK) 0.3 mg/0.3 mL IJ SOAJ injection Inject 0.3 mg into the muscle as needed for anaphylaxis.    . fexofenadine-pseudoephedrine (ALLEGRA-D 24) 180-240 MG 24 hr tablet Take 1 tablet by mouth daily. Rotates each month between Allegra and Xyzal.    . fluticasone (FLOVENT HFA) 220 MCG/ACT inhaler Inhale 2 puffs into the lungs 2 (two) times daily. 1 Inhaler 5  . Fluticasone Propionate (XHANCE) 93 MCG/ACT EXHU Place 2 sprays into the nose 2 (two) times daily. 32 mL 3  .  ipratropium-albuterol (DUONEB) 0.5-2.5 (3) MG/3ML SOLN Take 3 mLs by nebulization every 6 (six) hours as needed.    Marland Kitchen levocetirizine (XYZAL) 5 MG tablet Take 1 tablet (5 mg total) by mouth every evening. 30 tablet 5  . montelukast (SINGULAIR) 10 MG tablet Take 1 tablet (10 mg total) by mouth at bedtime. 30 tablet 5  . Olopatadine HCl (PATADAY) 0.2 % SOLN Place 1 drop into both eyes 2 (two) times daily. 2.5 mL 5  . triamcinolone ointment (KENALOG) 0.1 % Apply 1 application topically 2 (two) times daily. 454 g 5  . acetaminophen (TYLENOL) 325 MG tablet Take 325 mg by mouth every 6 (six) hours as needed.    Marland Kitchen acyclovir (ZOVIRAX) 400 MG tablet Take 1 tablet (400 mg total) by mouth 3 (three) times daily. 15 tablet 0  . acyclovir (ZOVIRAX) 400 MG tablet Take 1 tablet (400 mg total) by mouth 2 (two) times daily. 60 tablet 5  . ALPRAZolam (XANAX) 0.25 MG tablet Take 1-2 tabs (0.25mg -0.50mg ) 30-60 minutes before procedure. May repeat if needed.Do not drive. (Patient not taking: Reported on 08/23/2019) 4 tablet 0  . calcium carbonate (OSCAL) 1500 (600 Ca) MG TABS tablet Take 1,500 mg by mouth daily with breakfast.    . ergocalciferol (VITAMIN D2) 1.25 MG (50000 UT) capsule Take 50,000 Units by mouth once a week.    . ferrous sulfate 325 (65 FE) MG tablet Take 325 mg by mouth daily  with breakfast.    . fluticasone furoate-vilanterol (BREO ELLIPTA) 200-25 MCG/INH AEPB Inhale 1 puff into the lungs daily.    . Fremanezumab-vfrm (AJOVY) 225 MG/1.5ML SOAJ Inject 225 mg into the skin every 30 (thirty) days. 1 pen 11  . montelukast (SINGULAIR) 10 MG tablet Take 10 mg by mouth at bedtime.    . Multiple Vitamins-Minerals (BARIATRIC MULTIVITAMINS/IRON PO) Take 1 tablet by mouth.    . nitrofurantoin (MACRODANTIN) 50 MG capsule Take 50 mg by mouth daily.    . ondansetron (ZOFRAN ODT) 4 MG disintegrating tablet Take 1 tablet (4 mg total) by mouth every 8 (eight) hours as needed for nausea or vomiting. 4 tablet 0  .  propranolol (INDERAL) 10 MG tablet Take 10 mg by mouth as needed.    . Rimegepant Sulfate (NURTEC) 75 MG TBDP Take 75 mg by mouth daily as needed. For migraines. Take as close to onset of migraine as possible. One daily maximum. 10 tablet 6  . rizatriptan (MAXALT-MLT) 10 MG disintegrating tablet Take 1 tablet (10 mg total) by mouth as needed for migraine. May repeat in 2 hours if needed 9 tablet 11  . tamoxifen (NOLVADEX) 20 MG tablet Take 50 mg by mouth daily.     . traZODone (DESYREL) 150 MG tablet Take 150 mg by mouth at bedtime as needed for sleep.    . valACYclovir (VALTREX) 500 MG tablet Take 1 tablet (500 mg total) by mouth 2 (two) times daily. 6 tablet 1  . venlafaxine (EFFEXOR) 100 MG tablet Take 100 mg by mouth 2 (two) times daily.    . verapamil (CALAN) 80 MG tablet Take 1 tablet (80 mg total) by mouth 3 (three) times daily. 270 tablet 3   No current facility-administered medications for this visit.    Allergies  Allergen Reactions  . Bee Venom Anaphylaxis  . Peanut-Containing Drug Products Anaphylaxis  . Pollen Extract Shortness Of Breath    Tree and grass   . Shellfish Allergy Anaphylaxis  . Wasp Venom Anaphylaxis  . Xolair [Omalizumab] Anaphylaxis  . Amoxicillin Hives  . Bug Itch Releaf [Misc Natural Products] Hives    Roaches, ants and dustmites  . Contrave [Naltrexone-Bupropion Hcl Er] Hives  . Corn-Containing Products     GI unset  . Dust Mite Extract     Asthma trigger  . Gluten Meal     GI upset, HIVeS  . Iodine Hives  . Lidocaine Hives  . Orange Fruit [Citrus] Hives  . Penicillins Hives  . Sesame Oil Diarrhea    GI upset  . Soy Allergy Hives    GI upset  . Tetracaine Hives  . Tomato Hives   Review of systems: Review of systems negative except as noted in HPI / PMHx.  Past Medical History:  Diagnosis Date  . Allergy   . Anxiety   . Asthma   . Depression   . Eczema   . Migraine   . Urticaria     Family History  Problem Relation Age of Onset    . Hypertension Mother   . Allergic rhinitis Mother   . Asthma Mother   . Eczema Mother   . Depression Father   . Asthma Father   . Bipolar disorder Sister   . Asthma Sister   . Hypertension Maternal Grandmother   . Hypertension Paternal Grandmother   . Diabetes Paternal Grandmother   . Hypertension Paternal Grandfather   . Migraines Neg Hx     Social History   Socioeconomic  History  . Marital status: Married    Spouse name: Not on file  . Number of children: 3  . Years of education: Not on file  . Highest education level: Master's degree (e.g., MA, MS, MEng, MEd, MSW, MBA)  Occupational History  . Not on file  Tobacco Use  . Smoking status: Never Smoker  . Smokeless tobacco: Never Used  Substance and Sexual Activity  . Alcohol use: Yes    Comment: occasional  . Drug use: Never  . Sexual activity: Not on file  Other Topics Concern  . Not on file  Social History Narrative   Lives at home with her children    Right handed   Caffeine: 2-3 cups/day   Social Determinants of Health   Financial Resource Strain:   . Difficulty of Paying Living Expenses:   Food Insecurity:   . Worried About Charity fundraiser in the Last Year:   . Arboriculturist in the Last Year:   Transportation Needs:   . Film/video editor (Medical):   Marland Kitchen Lack of Transportation (Non-Medical):   Physical Activity:   . Days of Exercise per Week:   . Minutes of Exercise per Session:   Stress:   . Feeling of Stress :   Social Connections:   . Frequency of Communication with Friends and Family:   . Frequency of Social Gatherings with Friends and Family:   . Attends Religious Services:   . Active Member of Clubs or Organizations:   . Attends Archivist Meetings:   Marland Kitchen Marital Status:   Intimate Partner Violence:   . Fear of Current or Ex-Partner:   . Emotionally Abused:   Marland Kitchen Physically Abused:   . Sexually Abused:     I appreciate the opportunity to take part in Lourie's care.  Please do not hesitate to contact me with questions.  Sincerely,   R. Edgar Frisk, MD

## 2019-11-19 NOTE — Assessment & Plan Note (Signed)
   Treatment plan as outlined above for allergic rhinitis.  Continue Pataday, one drop per eye daily as needed.  I have also recommended eye lubricant drops (i.e., Natural Tears) as needed.

## 2019-11-22 LAB — CBC WITH DIFFERENTIAL/PLATELET
Basophils Absolute: 0 10*3/uL (ref 0.0–0.2)
Basos: 0 %
EOS (ABSOLUTE): 0.1 10*3/uL (ref 0.0–0.4)
Eos: 2 %
Hematocrit: 41.7 % (ref 34.0–46.6)
Hemoglobin: 13.7 g/dL (ref 11.1–15.9)
Immature Grans (Abs): 0 10*3/uL (ref 0.0–0.1)
Immature Granulocytes: 0 %
Lymphocytes Absolute: 2.9 10*3/uL (ref 0.7–3.1)
Lymphs: 42 %
MCH: 31.3 pg (ref 26.6–33.0)
MCHC: 32.9 g/dL (ref 31.5–35.7)
MCV: 95 fL (ref 79–97)
Monocytes Absolute: 0.4 10*3/uL (ref 0.1–0.9)
Monocytes: 6 %
Neutrophils Absolute: 3.3 10*3/uL (ref 1.4–7.0)
Neutrophils: 50 %
Platelets: 275 10*3/uL (ref 150–450)
RBC: 4.38 x10E6/uL (ref 3.77–5.28)
RDW: 12.6 % (ref 11.7–15.4)
WBC: 6.8 10*3/uL (ref 3.4–10.8)

## 2019-11-22 LAB — ALLERGEN SUNFLOWER SEED K84: Sunflower Seed k84: <0.1 kU/L

## 2019-11-22 LAB — ALLERGEN SESAME F10: Sesame Seed IgE: 0.14 kU/L — AB

## 2019-11-22 LAB — ALLERGEN, PUMPKIN (F225) IGE: Pumpkin IgE: <0.1 kU/L

## 2019-11-22 LAB — ALLERGEN COCONUT IGE: Allergen Coconut IgE: <0.1 kU/L

## 2019-11-25 ENCOUNTER — Other Ambulatory Visit: Payer: Self-pay | Admitting: Allergy and Immunology

## 2019-11-29 ENCOUNTER — Ambulatory Visit: Payer: Self-pay

## 2019-11-30 ENCOUNTER — Ambulatory Visit (INDEPENDENT_AMBULATORY_CARE_PROVIDER_SITE_OTHER): Payer: BC Managed Care – PPO

## 2019-11-30 DIAGNOSIS — J309 Allergic rhinitis, unspecified: Secondary | ICD-10-CM | POA: Diagnosis not present

## 2019-12-06 ENCOUNTER — Ambulatory Visit (INDEPENDENT_AMBULATORY_CARE_PROVIDER_SITE_OTHER): Payer: BC Managed Care – PPO

## 2019-12-06 DIAGNOSIS — J309 Allergic rhinitis, unspecified: Secondary | ICD-10-CM

## 2019-12-11 ENCOUNTER — Ambulatory Visit (INDEPENDENT_AMBULATORY_CARE_PROVIDER_SITE_OTHER): Payer: BC Managed Care – PPO

## 2019-12-11 DIAGNOSIS — J309 Allergic rhinitis, unspecified: Secondary | ICD-10-CM

## 2019-12-19 ENCOUNTER — Ambulatory Visit (INDEPENDENT_AMBULATORY_CARE_PROVIDER_SITE_OTHER): Payer: BC Managed Care – PPO

## 2019-12-19 DIAGNOSIS — J309 Allergic rhinitis, unspecified: Secondary | ICD-10-CM

## 2019-12-24 ENCOUNTER — Ambulatory Visit (INDEPENDENT_AMBULATORY_CARE_PROVIDER_SITE_OTHER): Payer: BC Managed Care – PPO

## 2019-12-24 DIAGNOSIS — J309 Allergic rhinitis, unspecified: Secondary | ICD-10-CM | POA: Diagnosis not present

## 2019-12-25 ENCOUNTER — Telehealth: Payer: Self-pay | Admitting: *Deleted

## 2019-12-25 ENCOUNTER — Other Ambulatory Visit: Payer: Self-pay | Admitting: *Deleted

## 2019-12-25 MED ORDER — TRIAMCINOLONE ACETONIDE 0.1 % EX OINT: 1.0000 "application " | TOPICAL_OINTMENT | Freq: Two times a day (BID) | CUTANEOUS | 5 refills | Status: DC | PRN

## 2019-12-25 NOTE — Telephone Encounter (Signed)
PA has been submitted for Eucrisa through CoverMyMeds and is currently pending approval or denial.

## 2019-12-26 ENCOUNTER — Encounter: Payer: Self-pay | Admitting: *Deleted

## 2019-12-26 NOTE — Telephone Encounter (Signed)
PA has been approved for Eucrisa and has been faxed to patient's pharmacy, labeled, and placed in bulk scanning.

## 2020-01-03 ENCOUNTER — Ambulatory Visit (INDEPENDENT_AMBULATORY_CARE_PROVIDER_SITE_OTHER): Payer: BC Managed Care – PPO

## 2020-01-03 ENCOUNTER — Telehealth: Payer: Self-pay | Admitting: *Deleted

## 2020-01-03 DIAGNOSIS — J309 Allergic rhinitis, unspecified: Secondary | ICD-10-CM | POA: Diagnosis not present

## 2020-01-03 NOTE — Telephone Encounter (Signed)
Allergy Flowsheet has been updated to reflect these changes. Patient is aware.

## 2020-01-03 NOTE — Telephone Encounter (Signed)
Decrease the G-Weeds-T-DM vial to 0.025 mL for her next dose and build up from there if tolerated. Thanks

## 2020-01-03 NOTE — Telephone Encounter (Signed)
Patient received .3mL on 12/24/19 out of her G-WEEDS-T-DM vial and had a 4+ reaction. She was backed down today to 0.43mL out of that vial and has had a 3+ local reaction. She is not having any issues with her other vial extract. Please advise anything different that may be done with her inj. Thank You.

## 2020-01-08 ENCOUNTER — Ambulatory Visit (INDEPENDENT_AMBULATORY_CARE_PROVIDER_SITE_OTHER): Payer: BC Managed Care – PPO

## 2020-01-08 DIAGNOSIS — J309 Allergic rhinitis, unspecified: Secondary | ICD-10-CM | POA: Diagnosis not present

## 2020-01-23 ENCOUNTER — Ambulatory Visit (INDEPENDENT_AMBULATORY_CARE_PROVIDER_SITE_OTHER): Payer: BC Managed Care – PPO | Admitting: *Deleted

## 2020-01-23 DIAGNOSIS — J309 Allergic rhinitis, unspecified: Secondary | ICD-10-CM

## 2020-02-05 ENCOUNTER — Ambulatory Visit (INDEPENDENT_AMBULATORY_CARE_PROVIDER_SITE_OTHER): Payer: BC Managed Care – PPO

## 2020-02-05 DIAGNOSIS — J309 Allergic rhinitis, unspecified: Secondary | ICD-10-CM

## 2020-02-06 ENCOUNTER — Encounter: Payer: Self-pay | Admitting: Internal Medicine

## 2020-02-13 ENCOUNTER — Ambulatory Visit (INDEPENDENT_AMBULATORY_CARE_PROVIDER_SITE_OTHER): Payer: BC Managed Care – PPO

## 2020-02-13 DIAGNOSIS — J309 Allergic rhinitis, unspecified: Secondary | ICD-10-CM | POA: Diagnosis not present

## 2020-02-22 ENCOUNTER — Ambulatory Visit (INDEPENDENT_AMBULATORY_CARE_PROVIDER_SITE_OTHER): Payer: BC Managed Care – PPO

## 2020-02-22 DIAGNOSIS — J309 Allergic rhinitis, unspecified: Secondary | ICD-10-CM | POA: Diagnosis not present

## 2020-03-05 ENCOUNTER — Ambulatory Visit (INDEPENDENT_AMBULATORY_CARE_PROVIDER_SITE_OTHER): Payer: BC Managed Care – PPO

## 2020-03-05 DIAGNOSIS — J309 Allergic rhinitis, unspecified: Secondary | ICD-10-CM | POA: Diagnosis not present

## 2020-03-11 ENCOUNTER — Encounter (HOSPITAL_COMMUNITY): Payer: Self-pay | Admitting: Emergency Medicine

## 2020-03-11 ENCOUNTER — Other Ambulatory Visit: Payer: Self-pay

## 2020-03-11 ENCOUNTER — Emergency Department (HOSPITAL_COMMUNITY)
Admission: EM | Admit: 2020-03-11 | Discharge: 2020-03-11 | Disposition: A | Discharge status: Home / Self Care | Payer: No Typology Code available for payment source | Attending: Emergency Medicine | Admitting: Emergency Medicine

## 2020-03-11 DIAGNOSIS — Z7951 Long term (current) use of inhaled steroids: Secondary | ICD-10-CM | POA: Diagnosis not present

## 2020-03-11 DIAGNOSIS — Z91010 Allergy to peanuts: Secondary | ICD-10-CM | POA: Insufficient documentation

## 2020-03-11 DIAGNOSIS — T7840XA Allergy, unspecified, initial encounter: Secondary | ICD-10-CM | POA: Insufficient documentation

## 2020-03-11 DIAGNOSIS — Z7952 Long term (current) use of systemic steroids: Secondary | ICD-10-CM | POA: Diagnosis not present

## 2020-03-11 DIAGNOSIS — Z79899 Other long term (current) drug therapy: Secondary | ICD-10-CM | POA: Diagnosis not present

## 2020-03-11 DIAGNOSIS — J45909 Unspecified asthma, uncomplicated: Secondary | ICD-10-CM | POA: Diagnosis not present

## 2020-03-11 MED ORDER — EPINEPHRINE 0.3 MG/0.3ML IJ SOAJ: 0.3000 mg | INTRAMUSCULAR | 1 refills | Status: DC | PRN

## 2020-03-11 MED ORDER — FLOVENT HFA 220 MCG/ACT IN AERO: 2.0000 | INHALATION_SPRAY | Freq: Two times a day (BID) | RESPIRATORY_TRACT | 1 refills | Status: DC | PRN

## 2020-03-11 MED ORDER — PREDNISONE 10 MG PO TABS: 20.0000 mg | ORAL_TABLET | Freq: Every day | ORAL | 0 refills | Status: DC

## 2020-03-11 NOTE — ED Provider Notes (Signed)
[facility] EMERGENCY DEPARTMENT Provider Note   CSN: [id number] Arrival date & time: 03/11/20  1641     History Chief Complaint  Patient presents with  . Allergic Reaction    Carla Buck is a 39 y.o. female.  39 year old female with prior medical history detailed below presents for evaluation of rash.  Patient reports that she was eating.  She felt allergic reaction symptoms.  She describes feeling like her throat was closing.  She gave herself 2 EpiPen prior to EMS arrival.  EMS administered an additional dose of epinephrine plus Solu-Medrol, magnesium, Benadryl.  Patient is asymptomatic upon arrival to the ED.  She feels significant for.  The history is provided by the patient.  Allergic Reaction Presenting symptoms: difficulty breathing   Severity:  Moderate Duration:  1 hour Prior allergic episodes:  Food/nut allergies Relieved by:  Nothing Worsened by:  Nothing      Past Medical History:  Diagnosis Date  . Allergy   . Anxiety   . Asthma   . Depression   . Eczema   . Migraine   . Urticaria     Patient Active Problem List   Diagnosis Date Noted  . History of recurrent UTIs 10/19/2019  . Allergic conjunctivitis 07/30/2019  . Food allergy 07/30/2019  . Chronic migraine without aura without status migrainosus, not intractable 07/02/2019  . Right knee pain 06/21/2019  . History of migraine 06/19/2019  . Moderate persistent asthma 06/19/2019  . Perennial and seasonal allergic rhinitis 06/19/2019  . Ganglion cyst of finger 06/19/2019  . Eczema 06/19/2019  . Obesity (BMI 30-39.9) 06/19/2019  . 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 Anxiety and depression 06/19/2019    Past Surgical History:  Procedure Laterality Date  . ABDOMINAL HYSTERECTOMY  11/2015   fibroids  . BREAST LUMPECTOMY    . BREAST LUMPECTOMY Right 11/14/2019  . GASTRIC BYPASS    . OOPHORECTOMY Left 09/2018   torsion       OB History   No obstetric history on file.     Family History  Problem Relation Age of Onset  . Hypertension Mother   . Allergic rhinitis Mother   . Asthma Mother   . Eczema Mother   . Depression Father   . Asthma Father   . Bipolar disorder Sister   . Asthma Sister   . Hypertension Maternal Grandmother   . Hypertension Paternal Grandmother   . Diabetes Paternal Grandmother   . Hypertension Paternal Grandfather   . Migraines Neg Hx     Social History   Tobacco Use  . Smoking status: Never Smoker  . Smokeless tobacco: Never Used  Vaping Use  . Vaping Use: Never used  Substance Use Topics  . Alcohol use: Yes    Comment: occasional  . Drug use: Never    Home Medications Prior to Admission medications   Medication Sig Start Date End Date Taking? Authorizing Provider  acetaminophen (TYLENOL) 325 MG tablet Take 325 mg by mouth every 6 (six) hours as needed.    [provider]  acyclovir (ZOVIRAX) 400 MG tablet Take 1 tablet (400 mg total) by mouth 3 (three) times daily. 10/19/19   Ladell Pier, MD  acyclovir (ZOVIRAX) 400 MG tablet Take 1 tablet (400 mg total) by mouth 2 (two) times daily. 10/19/19   Ladell Pier, MD  albuterol (VENTOLIN HFA) 108 (90 Base) MCG/ACT inhaler TAKE 2 PUFFS BY MOUTH EVERY  6 HOURS AS NEEDED FOR WHEEZE OR SHORTNESS OF BREATH 11/26/19   Bobbitt, Sedalia Muta, MD  ALPRAZolam Duanne Moron) 0.25 MG tablet Take 1-2 tabs (0.25mg -0.50mg ) 30-60 minutes before procedure. May repeat if needed.Do not drive. Patient not taking: Reported on 08/23/2019 07/02/19   Melvenia Beam, MD  Budeson-Glycopyrrol-Formoterol (BREZTRI AEROSPHERE) 160-9-4.8 MCG/ACT AERO Inhale 2 puffs into the lungs 2 (two) times daily. 07/30/19   Bobbitt, Sedalia Muta, MD  calcium carbonate (OSCAL) 1500 (600 Ca) MG TABS tablet Take 1,500 mg by mouth daily with breakfast.    [provider]  Crisaborole (EUCRISA) 2 % OINT Apply 1 application topically 2 (two) times  daily. 07/30/19   Bobbitt, Sedalia Muta, MD  diphenhydrAMINE (BENADRYL) 25 mg capsule Take 25 mg by mouth every 6 (six) hours as needed.    [provider]  EPINEPHrine (EPIPEN 2-PAK) 0.3 mg/0.3 mL IJ SOAJ injection Inject 0.3 mg into the muscle as needed for anaphylaxis.    [provider]  EPINEPHrine 0.3 mg/0.3 mL IJ SOAJ injection Inject 0.3 mLs (0.3 mg total) into the muscle as needed for anaphylaxis. 03/11/20   Valarie Merino, MD  ergocalciferol (VITAMIN D2) 1.25 MG (50000 UT) capsule Take 50,000 Units by mouth once a week.    [provider]  ferrous sulfate 325 (65 FE) MG tablet Take 325 mg by mouth daily with breakfast.    [provider]  fexofenadine-pseudoephedrine (ALLEGRA-D 24) 180-240 MG 24 hr tablet Take 1 tablet by mouth daily. Rotates each month between Allegra and Xyzal.    [provider]  fluticasone (FLOVENT HFA) 220 MCG/ACT inhaler Inhale 2 puffs into the lungs 2 (two) times daily as needed. 03/11/20   Bobbitt, Sedalia Muta, MD  fluticasone furoate-vilanterol (BREO ELLIPTA) 200-25 MCG/INH AEPB Inhale 1 puff into the lungs daily.    [provider]  Fluticasone Propionate (XHANCE) 93 MCG/ACT EXHU Place 2 sprays into the nose 2 (two) times daily. 07/30/19   Bobbitt, Sedalia Muta, MD  Fremanezumab-vfrm (AJOVY) 225 MG/1.5ML SOAJ Inject 225 mg into the skin every 30 (thirty) days. 07/02/19   Melvenia Beam, MD  ipratropium-albuterol (DUONEB) 0.5-2.5 (3) MG/3ML SOLN Take 3 mLs by nebulization every 6 (six) hours as needed.    [provider]  levocetirizine (XYZAL) 5 MG tablet Take 1 tablet (5 mg total) by mouth every evening. 11/19/19   Bobbitt, Sedalia Muta, MD  montelukast (SINGULAIR) 10 MG tablet Take 10 mg by mouth at bedtime.    [provider]  montelukast (SINGULAIR) 10 MG tablet Take 1 tablet (10 mg total) by mouth at bedtime. 07/30/19   Bobbitt, Sedalia Muta, MD  Multiple Vitamins-Minerals (BARIATRIC  MULTIVITAMINS/IRON PO) Take 1 tablet by mouth.    [provider]  nitrofurantoin (MACRODANTIN) 50 MG capsule Take 50 mg by mouth daily. 09/14/19   [provider]  Olopatadine HCl (PATADAY) 0.2 % SOLN Place 1 drop into both eyes 2 (two) times daily. 08/24/19   Valentina Shaggy, MD  ondansetron (ZOFRAN ODT) 4 MG disintegrating tablet Take 1 tablet (4 mg total) by mouth every 8 (eight) hours as needed for nausea or vomiting. 07/19/19   Khatri, Hina, PA-C  predniSONE (DELTASONE) 10 MG tablet Take 2 tablets (20 mg total) by mouth daily. 03/11/20   Valarie Merino, MD  propranolol (INDERAL) 10 MG tablet Take 10 mg by mouth as needed.    [provider]  Rimegepant Sulfate (NURTEC) 75 MG TBDP Take 75 mg by mouth daily as  needed. For migraines. Take as close to onset of migraine as possible. One daily maximum. 10/04/19   Melvenia Beam, MD  rizatriptan (MAXALT-MLT) 10 MG disintegrating tablet Take 1 tablet (10 mg total) by mouth as needed for migraine. May repeat in 2 hours if needed 07/02/19   Melvenia Beam, MD  tamoxifen (NOLVADEX) 20 MG tablet Take 50 mg by mouth daily.     [provider]  traZODone (DESYREL) 150 MG tablet Take 150 mg by mouth at bedtime as needed for sleep.    [provider]  triamcinolone ointment (KENALOG) 0.1 % Apply 1 application topically 2 (two) times daily. 07/30/19   Bobbitt, Sedalia Muta, MD  triamcinolone ointment (KENALOG) 0.1 % Apply 1 application topically 2 (two) times daily as needed. 12/25/19   Bobbitt, Sedalia Muta, MD  valACYclovir (VALTREX) 500 MG tablet Take 1 tablet (500 mg total) by mouth 2 (two) times daily. 10/05/19   Ladell Pier, MD  venlafaxine (EFFEXOR) 100 MG tablet Take 100 mg by mouth 2 (two) times daily.    [provider]  verapamil (CALAN) 80 MG tablet Take 1 tablet (80 mg total) by mouth 3 (three) times daily. 07/30/19   Melvenia Beam, MD    Allergies    Bee venom, Peanut-containing  drug products, Pollen extract, Shellfish allergy, Wasp venom, Xolair [omalizumab], Amoxicillin, Bug itch releaf [misc natural products], Contrave [naltrexone-bupropion hcl er], Corn-containing products, Dust mite extract, Gluten meal, Iodine, Lidocaine, Orange fruit [citrus], Penicillins, Sesame oil, Soy allergy, Tetracaine, and Tomato  Review of Systems   Review of Systems  All other systems reviewed and are negative.   Physical Exam Updated Vital Signs BP 120/74   Pulse 91   Temp 98.8 F (37.1 C) (Oral)   Resp (!) 26   SpO2 97%   Physical Exam Vitals and nursing note reviewed.  Constitutional:      General: She is not in acute distress.    Appearance: Normal appearance. She is well-developed.  HENT:     Head: Normocephalic and atraumatic.  Eyes:     Conjunctiva/sclera: Conjunctivae normal.     Pupils: Pupils are equal, round, and reactive to light.  Cardiovascular:     Rate and Rhythm: Normal rate and regular rhythm.     Heart sounds: Normal heart sounds.  Pulmonary:     Effort: Pulmonary effort is normal. No respiratory distress.     Breath sounds: Normal breath sounds.  Abdominal:     General: There is no distension.     Palpations: Abdomen is soft.     Tenderness: There is no abdominal tenderness.  Musculoskeletal:        General: No deformity. Normal range of motion.     Cervical back: Normal range of motion and neck supple.  Skin:    General: Skin is warm and dry.  Neurological:     Mental Status: She is alert and oriented to person, place, and time.     ED Results / Procedures / Treatments   Labs (all labs ordered are listed, but only abnormal results are displayed) Labs Reviewed - No data to display  EKG EKG Interpretation  Date/Time:  Tuesday March 11 2020 16:44:49 EDT Ventricular Rate:  105 PR Interval:    QRS Duration: 95 QT Interval:  351 QTC Calculation: 464 R Axis:   20 Text Interpretation: Sinus or ectopic atrial tachycardia Minimal ST  depression, diffuse leads Confirmed by Dene Gentry [phone]) on 03/11/2020 4:54:44 PM  Radiology No results found.  Procedures Procedures (including critical care time)  Medications Ordered in ED Medications - No data to display  ED Course  I have reviewed the triage vital signs and the nursing notes.  Pertinent labs & imaging results that were available during my care of the patient were reviewed by me and considered in my medical decision making (see chart for details).    MDM Rules/Calculators/A&P                          MDM  Screen complete  Carla Buck was evaluated in Emergency Department on 03/11/2020 for the symptoms described in the history of present illness. She was evaluated in the context of the global COVID-19 pandemic, which necessitated consideration that the patient might be at risk for infection with the SARS-CoV-2 virus that causes COVID-19. Institutional protocols and algorithms that pertain to the evaluation of patients at risk for COVID-19 are in a state of rapid change based on information released by regulatory bodies including the CDC and federal and state organizations. These policies and algorithms were followed during the patient's care in the ED.  Patient is presenting for evaluation of reported allergic reaction.  Patient's symptoms resolved prior to arrival.  Patient observed for 3 hours while in the ED.  She remained asymptomatic.  After observation she now desires DC home.  No evidence of recurrent reaction at time of discharge.  Patient does understand need for close follow-up.  Strict return instructions given and understood.    Final Clinical Impression(s) / ED Diagnoses Final diagnoses:  Allergic reaction, initial encounter    Rx / DC Orders ED Discharge Orders         Ordered    predniSONE (DELTASONE) 10 MG tablet  Daily     Discontinue  Reprint     03/11/20 1953    EPINEPHrine 0.3 mg/0.3 mL IJ SOAJ injection  As needed      Discontinue  Reprint     03/11/20 1953           Valarie Merino, MD 03/11/20 1956

## 2020-03-11 NOTE — Discharge Instructions (Addendum)
Please return for any problem.   Follow up with your regular care providers as instructed.  

## 2020-03-11 NOTE — ED Triage Notes (Signed)
Pt here after having an allergic rx to something she ate at [facility]. Pt received two epi pens before ems arrival. EMS administered 50 benadryl, 0.3 epi, Mg 2G, Solumedrol 125, and albuterol neb. Pt no longer in distress. Pt does have a cough every now and then. Pt just states her chest feels tight in the central area. A&O x4

## 2020-03-14 ENCOUNTER — Other Ambulatory Visit: Payer: Self-pay | Admitting: Internal Medicine

## 2020-03-17 ENCOUNTER — Encounter: Payer: Self-pay | Admitting: Internal Medicine

## 2020-03-21 ENCOUNTER — Ambulatory Visit (INDEPENDENT_AMBULATORY_CARE_PROVIDER_SITE_OTHER): Payer: BC Managed Care – PPO

## 2020-03-21 DIAGNOSIS — J309 Allergic rhinitis, unspecified: Secondary | ICD-10-CM

## 2020-03-27 ENCOUNTER — Encounter: Payer: Self-pay | Admitting: Internal Medicine

## 2020-03-28 ENCOUNTER — Ambulatory Visit: Payer: BC Managed Care – PPO | Attending: Internal Medicine | Admitting: Internal Medicine

## 2020-03-28 ENCOUNTER — Encounter: Payer: Self-pay | Admitting: Internal Medicine

## 2020-03-28 ENCOUNTER — Other Ambulatory Visit: Payer: Self-pay

## 2020-03-28 VITALS — BP 120/88 | HR 68 | Temp 98.4°F | Resp 16 | Wt 200.2 lb

## 2020-03-28 DIAGNOSIS — H6690 Otitis media, unspecified, unspecified ear: Secondary | ICD-10-CM | POA: Diagnosis not present

## 2020-03-28 DIAGNOSIS — R030 Elevated blood-pressure reading, without diagnosis of hypertension: Secondary | ICD-10-CM

## 2020-03-28 DIAGNOSIS — E669 Obesity, unspecified: Secondary | ICD-10-CM

## 2020-03-28 MED ORDER — DOXYCYCLINE MONOHYDRATE 100 MG PO TABS: 100.0000 mg | ORAL_TABLET | Freq: Two times a day (BID) | ORAL | 0 refills | Status: DC

## 2020-03-28 NOTE — Progress Notes (Signed)
Patient ID: Carla Buck, female    DOB: [birth year]  MRN: [id number]  CC: Ear Pain   Subjective: Carla Buck is a 39 y.o. female who presents for UC eval of ear pain Her concerns today include:  Patient with historyofasthma, Allergies (food allergies and environmental allergies), papilloma (2 RT/1 LT breast), dep/anx/insomnia (followed by Dr. 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  Thought it is due to her wearing hearing aide all the time except on wkend.  She sometimes uses a Q-tip to clean the ear.  She has not had any drainage from the ear.  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  Obesity: Weight is up 12 pounds since April of this year.  She attributes this to not exercising as much as she should have been.  She had an asthma attack this summer so she is not able to exercise in the hot weather.  She feels she is still doing well in terms of her eating habits.  She drinks 2 protein shakes a day.  Diastolic blood pressure noted to be elevated today.  She has not had a problem with blood pressure in the past.  Reports just being physically tired today.  She has been up since 4 this morning.  Most recent blood pressure reading taken when she saw her allergist was normal.   Patient Active Problem List   Diagnosis Date Noted  . History of recurrent UTIs 10/19/2019  . Allergic conjunctivitis 07/30/2019  . Food allergy 07/30/2019  . Chronic migraine without aura without status migrainosus, not intractable 07/02/2019  . Right knee pain 06/21/2019  . History of migraine 06/19/2019  . Moderate persistent asthma 06/19/2019  . Perennial and seasonal allergic rhinitis 06/19/2019  . Ganglion cyst of finger 06/19/2019  . Eczema 06/19/2019  . Obesity (BMI 30-39.9) 06/19/2019  . 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 Papilloma of both breasts  06/19/2019  . Anxiety and depression 06/19/2019     Current Outpatient Medications on File Prior to Visit  Medication Sig Dispense Refill  . acetaminophen (TYLENOL) 325 MG tablet Take 325 mg by mouth every 6 (six) hours as needed.    Marland Kitchen acyclovir (ZOVIRAX) 400 MG tablet Take 1 tablet (400 mg total) by mouth 3 (three) times daily. 15 tablet 0  . acyclovir (ZOVIRAX) 400 MG tablet Take 1 tablet (400 mg total) by mouth 2 (two) times daily. 60 tablet 5  . acyclovir (ZOVIRAX) 400 MG tablet TAKE ONE TABLET TWICE DAILY 180 tablet 1  . albuterol (VENTOLIN HFA) 108 (90 Base) MCG/ACT inhaler TAKE 2 PUFFS BY MOUTH EVERY 6 HOURS AS NEEDED FOR WHEEZE OR SHORTNESS OF BREATH 18 g 1  . ALPRAZolam (XANAX) 0.25 MG tablet Take 1-2 tabs (0.25mg -0.50mg ) 30-60 minutes before procedure. May repeat if needed.Do not drive. (Patient not taking: Reported on 08/23/2019) 4 tablet 0  . Budeson-Glycopyrrol-Formoterol (BREZTRI AEROSPHERE) 160-9-4.8 MCG/ACT AERO Inhale 2 puffs into the lungs 2 (two) times daily. 10.7 g 5  . calcium carbonate (OSCAL) 1500 (600 Ca) MG TABS tablet Take 1,500 mg by mouth daily with breakfast.    . Crisaborole (EUCRISA) 2 % OINT Apply 1 application topically 2 (two) times daily. 100 g 5  . diphenhydrAMINE (BENADRYL) 25 mg capsule Take 25 mg by mouth every 6 (six) hours as needed.    Marland Kitchen EPINEPHrine (EPIPEN 2-PAK) 0.3 mg/0.3 mL IJ SOAJ  injection Inject 0.3 mg into the muscle as needed for anaphylaxis.    Marland Kitchen EPINEPHrine 0.3 mg/0.3 mL IJ SOAJ injection Inject 0.3 mLs (0.3 mg total) into the muscle as needed for anaphylaxis. 1 each 1  . ergocalciferol (VITAMIN D2) 1.25 MG (50000 UT) capsule Take 50,000 Units by mouth once a week.    . ferrous sulfate 325 (65 FE) MG tablet Take 325 mg by mouth daily with breakfast.    . fexofenadine-pseudoephedrine (ALLEGRA-D 24) 180-240 MG 24 hr tablet Take 1 tablet by mouth daily. Rotates each month between Allegra and Xyzal.    . fluticasone (FLOVENT HFA) 220 MCG/ACT inhaler  Inhale 2 puffs into the lungs 2 (two) times daily as needed. 1 Inhaler 1  . fluticasone furoate-vilanterol (BREO ELLIPTA) 200-25 MCG/INH AEPB Inhale 1 puff into the lungs daily.    . Fluticasone Propionate (XHANCE) 93 MCG/ACT EXHU Place 2 sprays into the nose 2 (two) times daily. 32 mL 3  . Fremanezumab-vfrm (AJOVY) 225 MG/1.5ML SOAJ Inject 225 mg into the skin every 30 (thirty) days. 1 pen 11  . ipratropium-albuterol (DUONEB) 0.5-2.5 (3) MG/3ML SOLN Take 3 mLs by nebulization every 6 (six) hours as needed.    Marland Kitchen levocetirizine (XYZAL) 5 MG tablet Take 1 tablet (5 mg total) by mouth every evening. 30 tablet 5  . montelukast (SINGULAIR) 10 MG tablet Take 10 mg by mouth at bedtime.    . montelukast (SINGULAIR) 10 MG tablet Take 1 tablet (10 mg total) by mouth at bedtime. 30 tablet 5  . Multiple Vitamins-Minerals (BARIATRIC MULTIVITAMINS/IRON PO) Take 1 tablet by mouth.    . nitrofurantoin (MACRODANTIN) 50 MG capsule Take 50 mg by mouth daily.    . Olopatadine HCl (PATADAY) 0.2 % SOLN Place 1 drop into both eyes 2 (two) times daily. 2.5 mL 5  . ondansetron (ZOFRAN ODT) 4 MG disintegrating tablet Take 1 tablet (4 mg total) by mouth every 8 (eight) hours as needed for nausea or vomiting. 4 tablet 0  . predniSONE (DELTASONE) 10 MG tablet Take 2 tablets (20 mg total) by mouth daily. 10 tablet 0  . propranolol (INDERAL) 10 MG tablet Take 10 mg by mouth as needed.    . Rimegepant Sulfate (NURTEC) 75 MG TBDP Take 75 mg by mouth daily as needed. For migraines. Take as close to onset of migraine as possible. One daily maximum. 10 tablet 6  . rizatriptan (MAXALT-MLT) 10 MG disintegrating tablet Take 1 tablet (10 mg total) by mouth as needed for migraine. May repeat in 2 hours if needed 9 tablet 11  . tamoxifen (NOLVADEX) 20 MG tablet Take 50 mg by mouth daily.     . traZODone (DESYREL) 150 MG tablet Take 150 mg by mouth at bedtime as needed for sleep.    Marland Kitchen triamcinolone ointment (KENALOG) 0.1 % Apply 1  application topically 2 (two) times daily. 454 g 5  . triamcinolone ointment (KENALOG) 0.1 % Apply 1 application topically 2 (two) times daily as needed. 30 g 5  . valACYclovir (VALTREX) 500 MG tablet Take 1 tablet (500 mg total) by mouth 2 (two) times daily. 6 tablet 1  . venlafaxine (EFFEXOR) 100 MG tablet Take 100 mg by mouth 2 (two) times daily.    . verapamil (CALAN) 80 MG tablet Take 1 tablet (80 mg total) by mouth 3 (three) times daily. 270 tablet 3   No current facility-administered medications on file prior to visit.    Allergies  Allergen Reactions  . Bee Venom Anaphylaxis  .  Peanut-Containing Drug Products Anaphylaxis  . Pollen Extract Shortness Of Breath    Tree and grass   . Shellfish Allergy Anaphylaxis  . Wasp Venom Anaphylaxis  . Xolair [Omalizumab] Anaphylaxis  . Amoxicillin Hives  . Bug Itch Releaf [Misc Natural Products] Hives    Roaches, ants and dustmites  . Contrave [Naltrexone-Bupropion Hcl Er] Hives  . Corn-Containing Products     GI unset  . Dust Mite Extract     Asthma trigger  . Gluten Meal     GI upset, HIVeS  . Iodine Hives  . Lidocaine Hives  . Orange Fruit [Citrus] Hives  . Penicillins Hives  . Sesame Oil Diarrhea    GI upset  . Soy Allergy Hives    GI upset  . Tetracaine Hives  . Tomato Hives    Social History   Socioeconomic History  . Marital status: Married    Spouse name: Not on file  . Number of children: 3  . Years of education: Not on file  . Highest education level: Master's degree (e.g., MA, MS, MEng, MEd, MSW, MBA)  Occupational History  . Not on file  Tobacco Use  . Smoking status: Never Smoker  . Smokeless tobacco: Never Used  Vaping Use  . Vaping Use: Never used  Substance and Sexual Activity  . Alcohol use: Yes    Comment: occasional  . Drug use: Never  . Sexual activity: Not on file  Other Topics Concern  . Not on file  Social History Narrative   Lives at home with her children    Right handed   Caffeine:  2-3 cups/day   Social Determinants of Health   Financial Resource Strain:   . Difficulty of Paying Living Expenses:   Food Insecurity:   . Worried About Charity fundraiser in the Last Year:   . Arboriculturist in the Last Year:   Transportation Needs:   . Film/video editor (Medical):   Marland Kitchen Lack of Transportation (Non-Medical):   Physical Activity:   . Days of Exercise per Week:   . Minutes of Exercise per Session:   Stress:   . Feeling of Stress :   Social Connections:   . Frequency of Communication with Friends and Family:   . Frequency of Social Gatherings with Friends and Family:   . Attends Religious Services:   . Active Member of Clubs or Organizations:   . Attends Archivist Meetings:   Marland Kitchen Marital Status:   Intimate Partner Violence:   . Fear of Current or Ex-Partner:   . Emotionally Abused:   Marland Kitchen Physically Abused:   . Sexually Abused:     Family History  Problem Relation Age of Onset  . Hypertension Mother   . Allergic rhinitis Mother   . Asthma Mother   . Eczema Mother   . Depression Father   . Asthma Father   . Bipolar disorder Sister   . Asthma Sister   . Hypertension Maternal Grandmother   . Hypertension Paternal Grandmother   . Diabetes Paternal Grandmother   . Hypertension Paternal Grandfather   . Migraines Neg Hx     Past Surgical History:  Procedure Laterality Date  . ABDOMINAL HYSTERECTOMY  11/2015   fibroids  . BREAST LUMPECTOMY    . BREAST LUMPECTOMY Right 11/14/2019  . GASTRIC BYPASS    . OOPHORECTOMY Left 09/2018   torsion    ROS: Review of Systems Negative except as stated above  PHYSICAL EXAM: BP  120/88   Pulse 68   Temp 98.4 F (36.9 C)   Resp 16   Wt 200 lb 3.2 oz (90.8 kg)   SpO2 98%   BMI 33.32 kg/m   Wt Readings from Last 3 Encounters:  03/28/20 200 lb 3.2 oz (90.8 kg)  11/19/19 188 lb 6.4 oz (85.5 kg)  10/19/19 186 lb 9.6 oz (84.6 kg)    Physical Exam  General appearance - alert, well appearing,  middle-aged African-American female and in no distress Mental status - normal mood, behavior, speech, dress, motor activity, and thought processes Ears -right: Tympanic membrane appears hazy which is likely due from past history of rupture.  Mild erythema.  No exudates noted.  Left ear canal and membranes are within normal limits  CMP Latest Ref Rng & Units 06/18/2019 03/01/2010  Glucose 65 - 99 mg/dL 84 93  BUN 6 - 20 mg/dL 15 7  Creatinine 0.57 - 1.00 mg/dL 0.66 0.7  Sodium 134 - 144 mmol/L 138 140  Potassium 3.5 - 5.2 mmol/L 4.4 4.2  Chloride 96 - 106 mmol/L 101 106  CO2 20 - 29 mmol/L 26 26  Calcium 8.7 - 10.2 mg/dL 8.8 8.9  Total Protein 6.0 - 8.5 g/dL 6.8 7.5  Total Bilirubin 0.0 - 1.2 mg/dL 0.2 0.7  Alkaline Phos 39 - 117 IU/L 60 66  AST 0 - 40 IU/L 20 26  ALT 0 - 32 IU/L 19 17   Lipid Panel     Component Value Date/Time   CHOL 153 06/18/2019 1507   TRIG 52 06/18/2019 1507   HDL 72 06/18/2019 1507   CHOLHDL 2.1 06/18/2019 1507   LDLCALC 70 06/18/2019 1507    CBC    Component Value Date/Time   WBC 6.8 11/19/2019 1607   WBC 6.4 03/01/2010 1235   RBC 4.38 11/19/2019 1607   RBC 4.53 03/01/2010 1235   HGB 13.7 11/19/2019 1607   HCT 41.7 11/19/2019 1607   PLT 275 11/19/2019 1607   MCV 95 11/19/2019 1607   MCH 31.3 11/19/2019 1607   MCH 29.0 03/01/2010 1235   MCHC 32.9 11/19/2019 1607   MCHC 33.3 03/01/2010 1235   RDW 12.6 11/19/2019 1607   LYMPHSABS 2.9 11/19/2019 1607   MONOABS 0.3 03/01/2010 1235   EOSABS 0.1 11/19/2019 1607   BASOSABS 0.0 11/19/2019 1607    ASSESSMENT AND PLAN: 1. Acute otitis media, unspecified otitis media type Patient has allergy to corn-containing products.  I checked with our clinical pharmacist to make sure that the doxycycline the regular tablet does not have corn containing products in them.  Advised patient that she can also check with the pharmacist there at her pharmacy when she goes to pick up the medication. - doxycycline (ADOXA)  100 MG tablet; Take 1 tablet (100 mg total) by mouth 2 (two) times daily.  Dispense: 14 tablet; Refill: 0  2. Elevated blood-pressure reading without diagnosis of hypertension Repeat blood pressure better.  I have also reviewed last 2 blood pressure in the EMR which were good.  DASH diet encouraged.  3. Obesity (BMI 30-39.9) Patient plans to start exercising again.  Encouraged her to continue to eat healthy with smaller portions.    Patient was given the opportunity to ask questions.  Patient verbalized understanding of the plan and was able to repeat key elements of the plan.   No orders of the defined types were placed in this encounter.    Requested Prescriptions   Signed Prescriptions Disp Refills  .  doxycycline (ADOXA) 100 MG tablet 14 tablet 0    Sig: Take 1 tablet (100 mg total) by mouth 2 (two) times daily.    Return if symptoms worsen or fail to improve.  [name], MD, FACP

## 2020-04-06 ENCOUNTER — Encounter: Payer: Self-pay | Admitting: Internal Medicine

## 2020-04-06 MED ORDER — AZITHROMYCIN 250 MG PO TABS: ORAL_TABLET | ORAL | 0 refills | Status: DC

## 2020-04-11 ENCOUNTER — Ambulatory Visit (INDEPENDENT_AMBULATORY_CARE_PROVIDER_SITE_OTHER): Payer: BC Managed Care – PPO | Admitting: *Deleted

## 2020-04-11 DIAGNOSIS — J309 Allergic rhinitis, unspecified: Secondary | ICD-10-CM | POA: Diagnosis not present

## 2020-04-17 ENCOUNTER — Other Ambulatory Visit: Payer: Self-pay

## 2020-04-17 MED ORDER — EPINEPHRINE 0.3 MG/0.3ML IJ SOAJ: 0.3000 mg | INTRAMUSCULAR | 1 refills | Status: DC | PRN

## 2020-04-23 ENCOUNTER — Other Ambulatory Visit: Payer: Self-pay | Admitting: *Deleted

## 2020-04-23 MED ORDER — XHANCE 93 MCG/ACT NA EXHU: 2.0000 | INHALANT_SUSPENSION | Freq: Two times a day (BID) | NASAL | 5 refills | Status: DC

## 2020-05-12 ENCOUNTER — Other Ambulatory Visit: Payer: Self-pay | Admitting: Allergy and Immunology

## 2020-05-19 ENCOUNTER — Emergency Department (HOSPITAL_COMMUNITY): Payer: BC Managed Care – PPO

## 2020-05-19 ENCOUNTER — Ambulatory Visit: Payer: Self-pay | Admitting: *Deleted

## 2020-05-19 ENCOUNTER — Encounter (HOSPITAL_COMMUNITY): Payer: Self-pay

## 2020-05-19 ENCOUNTER — Other Ambulatory Visit: Payer: Self-pay

## 2020-05-19 ENCOUNTER — Emergency Department (HOSPITAL_COMMUNITY)
Admission: EM | Admit: 2020-05-19 | Discharge: 2020-05-19 | Disposition: A | Discharge status: Home / Self Care | Payer: BC Managed Care – PPO | Attending: Emergency Medicine | Admitting: Emergency Medicine

## 2020-05-19 DIAGNOSIS — Z91010 Allergy to peanuts: Secondary | ICD-10-CM | POA: Insufficient documentation

## 2020-05-19 DIAGNOSIS — U071 COVID-19: Secondary | ICD-10-CM | POA: Diagnosis not present

## 2020-05-19 DIAGNOSIS — R0602 Shortness of breath: Secondary | ICD-10-CM | POA: Diagnosis present

## 2020-05-19 DIAGNOSIS — J4540 Moderate persistent asthma, uncomplicated: Secondary | ICD-10-CM | POA: Insufficient documentation

## 2020-05-19 LAB — RESPIRATORY PANEL BY RT PCR (FLU A&B, COVID)
Influenza A by PCR: NEGATIVE
Influenza B by PCR: NEGATIVE
SARS Coronavirus 2 by RT PCR: POSITIVE — AB

## 2020-05-19 MED ORDER — IPRATROPIUM BROMIDE HFA 17 MCG/ACT IN AERS
2.0000 | INHALATION_SPRAY | Freq: Once | RESPIRATORY_TRACT | Status: AC
  Administered 2020-05-19: 2 via RESPIRATORY_TRACT
  Filled 2020-05-19: qty 12.9

## 2020-05-19 MED ORDER — ALBUTEROL SULFATE HFA 108 (90 BASE) MCG/ACT IN AERS
6.0000 | INHALATION_SPRAY | Freq: Once | RESPIRATORY_TRACT | Status: AC
  Administered 2020-05-19: 6 via RESPIRATORY_TRACT
  Filled 2020-05-19: qty 6.7

## 2020-05-19 MED ORDER — HYDROCODONE-ACETAMINOPHEN 7.5-325 MG/15ML PO SOLN: 10.0000 mL | Freq: Once | ORAL | Status: DC

## 2020-05-19 MED ORDER — HYDROCODONE-ACETAMINOPHEN 5-325 MG PO TABS
1.0000 | ORAL_TABLET | Freq: Once | ORAL | Status: AC
  Administered 2020-05-19: 1 via ORAL
  Filled 2020-05-19: qty 1

## 2020-05-19 MED ORDER — DEXAMETHASONE SODIUM PHOSPHATE 10 MG/ML IJ SOLN
10.0000 mg | Freq: Once | INTRAMUSCULAR | Status: AC
  Administered 2020-05-19: 10 mg via INTRAMUSCULAR
  Filled 2020-05-19: qty 1

## 2020-05-19 MED ORDER — ONDANSETRON 4 MG PO TBDP
4.0000 mg | ORAL_TABLET | Freq: Once | ORAL | Status: AC
  Administered 2020-05-19: 4 mg via ORAL
  Filled 2020-05-19: qty 1

## 2020-05-19 NOTE — Discharge Instructions (Addendum)
Your Covid test was positive today.  Please follow CDC guidelines, use the attached instructions.  I want you to continue using your albuterol and Atrovent as we discussed.The monoclonal antibody infusion clinic should contact you.  The [facility] will cover you in regards to steroids for 2-3 days.  Follow-up with your primary care over telehealth next couple of days.  Continue to use Tylenol as directed on bottle for pain.  Please come back to the [facility] department for any worsening concerning symptoms.

## 2020-05-19 NOTE — ED Triage Notes (Signed)
Pt presents with SOB x1 day, speaking complete sentences, pt reports hx of asthma, no relief with her at home duo neb. Pt also reports all over body aches x1 day

## 2020-05-19 NOTE — Telephone Encounter (Signed)
Patient is having trouble breathing- she has used her inhalers and nebulizer and they are not helping. Patient stated she has pain with breathing and is labored. Advised per protocol- ED.  Reason for Disposition . [1] MODERATE difficulty breathing (e.g., speaks in phrases, SOB even at rest, pulse 100-120) AND [2] NEW-onset or WORSE than normal  Answer Assessment - Initial Assessment Questions 1. RESPIRATORY STATUS: "Describe your breathing?" (e.g., wheezing, shortness of breath, unable to speak, severe coughing)      SOB- medication is not helping- Pulse oc- 90-95 2. ONSET: "When did this breathing problem begin?"      Didn't feel well yesterday 3. PATTERN "Does the difficult breathing come and go, or has it been constant since it started?"      constant 4. SEVERITY: "How bad is your breathing?" (e.g., mild, moderate, severe)    - MILD: No SOB at rest, mild SOB with walking, speaks normally in sentences, can lay down, no retractions, pulse < 100.    - MODERATE: SOB at rest, SOB with minimal exertion and prefers to sit, cannot lie down flat, speaks in phrases, mild retractions, audible wheezing, pulse 100-120.    - SEVERE: Very SOB at rest, speaks in single words, struggling to breathe, sitting hunched forward, retractions, pulse > 120      moderate 5. RECURRENT SYMPTOM: "Have you had difficulty breathing before?" If Yes, ask: "When was the last time?" and "What happened that time?"      Asthma- pnuemonia 6. CARDIAC HISTORY: "Do you have any history of heart disease?" (e.g., heart attack, angina, bypass surgery, angioplasty)      no 7. LUNG HISTORY: "Do you have any history of lung disease?"  (e.g., pulmonary embolus, asthma, emphysema)     asthma 8. CAUSE: "What do you think is causing the breathing problem?"      Unsure- feels sick 9. OTHER SYMPTOMS: "Do you have any other symptoms? (e.g., dizziness, runny nose, cough, chest pain, fever)     Cough, pain with inspiration/expiration 10.  PREGNANCY: "Is there any chance you are pregnant?" "When was your last menstrual period?"       n/a 11. 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

## 2020-05-19 NOTE — ED Notes (Signed)
Cancel the liquid, put in for pill.

## 2020-05-19 NOTE — ED Provider Notes (Signed)
[facility] EMERGENCY DEPARTMENT Provider Note   CSN: [id number] Arrival date & time: 05/19/20  0944     History Chief Complaint  Patient presents with  . Shortness of Breath    Carla Buck is a 39 y.o. female-past medical history of allergies, anxiety, asthma that presents emergency department today for shortness of breath for 1 day.  Patient states that she thought she was having asthma exacerbations that she took her albuterol inhaler without relief today.  States that she has been having myalgias and shortness of breath for the past day.  Has been vaccinated against Covid, received both [place] vaccinations in February.  Denies any sick contacts.  Also admits to cough, nonproductive.  Also admits to some postnasal drip and headache.  No vision changes, neck pain, head injury, confusion, weakness, numbness, tingling.  Denies any fevers or chills, nausea or vomiting.  Denies any abdominal pain or back pain.  States that she was in normal health before this.  Denies any chest pain.  States that shortness of breath is worse when she lays down. Denies any leg swelling.   HPI     Past Medical History:  Diagnosis Date  . Allergy   . Anxiety   . Asthma   . Depression   . Eczema   . Hearing impairment    Wears hearing aids  . Migraine   . Urticaria     Patient Active Problem List   Diagnosis Date Noted  . History of recurrent UTIs 10/19/2019  . Allergic conjunctivitis 07/30/2019  . Food allergy 07/30/2019  . Chronic migraine without aura without status migrainosus, not intractable 07/02/2019  . Right knee pain 06/21/2019  . History of migraine 06/19/2019  . Moderate persistent asthma 06/19/2019  . Perennial and seasonal allergic rhinitis 06/19/2019  . Ganglion cyst of finger 06/19/2019  . Eczema 06/19/2019  . Obesity (BMI 30-39.9) 06/19/2019  . 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 Anxiety and  depression 06/19/2019    Past Surgical History:  Procedure Laterality Date  . ABDOMINAL HYSTERECTOMY  11/2015   fibroids  . BREAST LUMPECTOMY    . BREAST LUMPECTOMY Right 11/14/2019  . GASTRIC BYPASS    . OOPHORECTOMY Left 09/2018   torsion     OB History   No obstetric history on file.     Family History  Problem Relation Age of Onset  . Hypertension Mother   . Allergic rhinitis Mother   . Asthma Mother   . Eczema Mother   . Depression Father   . Asthma Father   . Bipolar disorder Sister   . Asthma Sister   . Hypertension Maternal Grandmother   . Hypertension Paternal Grandmother   . Diabetes Paternal Grandmother   . Hypertension Paternal Grandfather   . Migraines Neg Hx     Social History   Tobacco Use  . Smoking status: Never Smoker  . Smokeless tobacco: Never Used  Vaping Use  . Vaping Use: Never used  Substance Use Topics  . Alcohol use: Yes    Comment: occasional  . Drug use: Never    Home Medications Prior to Admission medications   Medication Sig Start Date End Date Taking? Authorizing Provider  acetaminophen (TYLENOL) 325 MG tablet Take 325 mg by mouth every 6 (six) hours as needed.    [provider]  acyclovir (ZOVIRAX) 400 MG tablet Take 1 tablet (400 mg total) by mouth 3 (three) times  daily. 10/19/19   Ladell Pier, MD  acyclovir (ZOVIRAX) 400 MG tablet Take 1 tablet (400 mg total) by mouth 2 (two) times daily. 10/19/19   Ladell Pier, MD  acyclovir (ZOVIRAX) 400 MG tablet TAKE ONE TABLET TWICE DAILY 03/14/20   Ladell Pier, MD  albuterol (VENTOLIN HFA) 108 (90 Base) MCG/ACT inhaler TAKE 2 PUFFS BY MOUTH EVERY 6 HOURS AS NEEDED FOR WHEEZE OR SHORTNESS OF BREATH 11/26/19   Bobbitt, Sedalia Muta, MD  ALPRAZolam Duanne Moron) 0.25 MG tablet Take 1-2 tabs (0.25mg -0.50mg ) 30-60 minutes before procedure. May repeat if needed.Do not drive. Patient not taking: Reported on 08/23/2019 07/02/19   Melvenia Beam, MD  azithromycin (ZITHROMAX  Z-PAK) 250 MG tablet 2 tabs PO x 1 then 1 tab PO daily. 04/06/20   Ladell Pier, MD  Budeson-Glycopyrrol-Formoterol (BREZTRI AEROSPHERE) 160-9-4.8 MCG/ACT AERO Inhale 2 puffs into the lungs 2 (two) times daily. 07/30/19   Bobbitt, Sedalia Muta, MD  calcium carbonate (OSCAL) 1500 (600 Ca) MG TABS tablet Take 1,500 mg by mouth daily with breakfast.    [provider]  Crisaborole (EUCRISA) 2 % OINT Apply 1 application topically 2 (two) times daily. 07/30/19   Bobbitt, Sedalia Muta, MD  diphenhydrAMINE (BENADRYL) 25 mg capsule Take 25 mg by mouth every 6 (six) hours as needed.    [provider]  doxycycline (ADOXA) 100 MG tablet Take 1 tablet (100 mg total) by mouth 2 (two) times daily. 03/28/20   Ladell Pier, MD  EPINEPHrine (AUVI-Q) 0.3 mg/0.3 mL IJ SOAJ injection Inject 0.3 mLs (0.3 mg total) into the muscle as needed for anaphylaxis. 04/17/20   Bobbitt, Sedalia Muta, MD  EPINEPHrine (EPIPEN 2-PAK) 0.3 mg/0.3 mL IJ SOAJ injection Inject 0.3 mg into the muscle as needed for anaphylaxis.    [provider]  EPINEPHrine 0.3 mg/0.3 mL IJ SOAJ injection Inject 0.3 mLs (0.3 mg total) into the muscle as needed for anaphylaxis. 03/11/20   Valarie Merino, MD  ergocalciferol (VITAMIN D2) 1.25 MG (50000 UT) capsule Take 50,000 Units by mouth once a week.    [provider]  ferrous sulfate 325 (65 FE) MG tablet Take 325 mg by mouth daily with breakfast.    [provider]  fexofenadine-pseudoephedrine (ALLEGRA-D 24) 180-240 MG 24 hr tablet Take 1 tablet by mouth daily. Rotates each month between Allegra and Xyzal.    [provider]  fluticasone (FLOVENT HFA) 220 MCG/ACT inhaler Inhale 2 puffs into the lungs 2 (two) times daily as needed. 03/11/20   Bobbitt, Sedalia Muta, MD  fluticasone furoate-vilanterol (BREO ELLIPTA) 200-25 MCG/INH AEPB Inhale 1 puff into the lungs daily.    [provider]  Fluticasone Propionate (XHANCE) 93 MCG/ACT  EXHU Place 2 sprays into the nose 2 (two) times daily. 04/23/20   Bobbitt, Sedalia Muta, MD  Fremanezumab-vfrm (AJOVY) 225 MG/1.5ML SOAJ Inject 225 mg into the skin every 30 (thirty) days. 07/02/19   Melvenia Beam, MD  ipratropium-albuterol (DUONEB) 0.5-2.5 (3) MG/3ML SOLN Take 3 mLs by nebulization every 6 (six) hours as needed.    [provider]  levocetirizine (XYZAL) 5 MG tablet Take 1 tablet (5 mg total) by mouth every evening. 11/19/19   Bobbitt, Sedalia Muta, MD  montelukast (SINGULAIR) 10 MG tablet Take 10 mg by mouth at bedtime.    [provider]  montelukast (SINGULAIR) 10 MG tablet Take 1 tablet (10 mg total) by mouth at bedtime. 07/30/19   Bobbitt, Sedalia Muta, MD  Multiple  Vitamins-Minerals (BARIATRIC MULTIVITAMINS/IRON PO) Take 1 tablet by mouth.    [provider]  nitrofurantoin (MACRODANTIN) 50 MG capsule Take 50 mg by mouth daily. 09/14/19   [provider]  Olopatadine HCl (PATADAY) 0.2 % SOLN Place 1 drop into both eyes 2 (two) times daily. 08/24/19   Valentina Shaggy, MD  ondansetron (ZOFRAN ODT) 4 MG disintegrating tablet Take 1 tablet (4 mg total) by mouth every 8 (eight) hours as needed for nausea or vomiting. 07/19/19   Khatri, Hina, PA-C  predniSONE (DELTASONE) 10 MG tablet Take 2 tablets (20 mg total) by mouth daily. 03/11/20   Valarie Merino, MD  propranolol (INDERAL) 10 MG tablet Take 10 mg by mouth as needed.    [provider]  Rimegepant Sulfate (NURTEC) 75 MG TBDP Take 75 mg by mouth daily as needed. For migraines. Take as close to onset of migraine as possible. One daily maximum. 10/04/19   Melvenia Beam, MD  rizatriptan (MAXALT-MLT) 10 MG disintegrating tablet Take 1 tablet (10 mg total) by mouth as needed for migraine. May repeat in 2 hours if needed 07/02/19   Melvenia Beam, MD  tamoxifen (NOLVADEX) 20 MG tablet Take 50 mg by mouth daily.     [provider]  traZODone (DESYREL) 150 MG tablet Take 150  mg by mouth at bedtime as needed for sleep.    [provider]  triamcinolone ointment (KENALOG) 0.1 % Apply 1 application topically 2 (two) times daily. 07/30/19   Bobbitt, Sedalia Muta, MD  triamcinolone ointment (KENALOG) 0.1 % Apply 1 application topically 2 (two) times daily as needed. 12/25/19   Bobbitt, Sedalia Muta, MD  valACYclovir (VALTREX) 500 MG tablet Take 1 tablet (500 mg total) by mouth 2 (two) times daily. 10/05/19   Ladell Pier, MD  venlafaxine (EFFEXOR) 100 MG tablet Take 100 mg by mouth 2 (two) times daily.    [provider]  verapamil (CALAN) 80 MG tablet Take 1 tablet (80 mg total) by mouth 3 (three) times daily. 07/30/19   Melvenia Beam, MD    Allergies    Bee venom, Peanut-containing drug products, Pollen extract, Shellfish allergy, Wasp venom, Xolair [omalizumab], Amoxicillin, Bug itch releaf [misc natural products], Contrave [naltrexone-bupropion hcl er], Corn-containing products, Dust mite extract, Gluten meal, Iodine, Lidocaine, Orange fruit [citrus], Penicillins, Sesame oil, Soy allergy, Tetracaine, and Tomato  Review of Systems   Review of Systems  Constitutional: Negative for chills, diaphoresis, fatigue and fever.  HENT: Negative for congestion, sore throat and trouble swallowing.   Eyes: Negative for pain and visual disturbance.  Respiratory: Positive for cough and shortness of breath. Negative for choking, chest tightness, wheezing and stridor.   Cardiovascular: Negative for chest pain, palpitations and leg swelling.  Gastrointestinal: Negative for abdominal distention, abdominal pain, diarrhea, nausea and vomiting.  Genitourinary: Negative for difficulty urinating.  Musculoskeletal: Positive for arthralgias and myalgias. Negative for back pain, neck pain and neck stiffness.  Skin: Negative for pallor.  Neurological: Negative for dizziness, speech difficulty, weakness and headaches.  Psychiatric/Behavioral: Negative for confusion.     Physical Exam Updated Vital Signs BP 132/87 (BP Location: Right Arm)   Pulse 80   Temp 99.6 F (37.6 C) (Oral)   Resp 13   SpO2 100%   Physical Exam Constitutional:      General: She is not in acute distress.    Appearance: Normal appearance. She is not ill-appearing, toxic-appearing or diaphoretic.     Comments: Patient is speaking  to me in full sentences, no respiratory distress.  Is satting at 100% on room air.  Handling secretions.  HENT:     Mouth/Throat:     Mouth: Mucous membranes are moist.     Pharynx: Oropharynx is clear.  Eyes:     General: No scleral icterus.    Extraocular Movements: Extraocular movements intact.     Pupils: Pupils are equal, round, and reactive to light.  Cardiovascular:     Rate and Rhythm: Normal rate and regular rhythm.     Pulses: Normal pulses.     Heart sounds: Normal heart sounds.  Pulmonary:     Effort: Pulmonary effort is normal. No respiratory distress.     Breath sounds: Normal breath sounds. No stridor. No decreased breath sounds, wheezing, rhonchi or rales.  Chest:     Chest wall: No tenderness.  Abdominal:     General: Abdomen is flat. There is no distension.     Palpations: Abdomen is soft.     Tenderness: There is no abdominal tenderness. There is no guarding or rebound.  Musculoskeletal:        General: No swelling or tenderness. Normal range of motion.     Cervical back: Normal range of motion and neck supple. No rigidity.     Right lower leg: No edema.     Left lower leg: No edema.     Comments: Tenderness to upper and lower extremities, tenderness to chest.  No ecchymosis.  Able to range extremities normally.  Normal strength and sensation. PT pulses 2+/  Skin:    General: Skin is warm and dry.     Capillary Refill: Capillary refill takes less than 2 seconds.     Coloration: Skin is not pale.  Neurological:     General: No focal deficit present.     Mental Status: She is alert and oriented to person, place, and  time.  Psychiatric:        Mood and Affect: Mood normal.        Behavior: Behavior normal.     ED Results / Procedures / Treatments   Labs (all labs ordered are listed, but only abnormal results are displayed) Labs Reviewed  RESPIRATORY PANEL BY RT PCR (FLU A&B, COVID) - Abnormal; Notable for the following components:      Result Value   SARS Coronavirus 2 by RT PCR POSITIVE (*)    All other components within normal limits    EKG None  Radiology DG Chest 2 View  Result Date: 05/19/2020 CLINICAL DATA:  Shortness of breath. EXAM: CHEST - 2 VIEW COMPARISON:  Chest radiograph 07/19/2019 FINDINGS: The heart size and mediastinal contours are within normal limits. Both lungs are clear. No pleural effusions. No pneumothorax. Bilateral breast clips. Mild broad-based thoracic dextrocurvature. No acute osseous abnormality. IMPRESSION: No acute cardiopulmonary disease. Electronically Signed   By: Margaretha Sheffield MD   On: 05/19/2020 10:50    Procedures Procedures (including critical care time)  Medications Ordered in ED Medications  HYDROcodone-acetaminophen (NORCO/VICODIN) 5-325 MG per tablet 1 tablet (1 tablet Oral Given 05/19/20 1804)  albuterol (VENTOLIN HFA) 108 (90 Base) MCG/ACT inhaler 6 puff (6 puffs Inhalation Given 05/19/20 2147)  ipratropium (ATROVENT HFA) inhaler 2 puff (2 puffs Inhalation Given 05/19/20 2147)  dexamethasone (DECADRON) injection 10 mg (10 mg Intramuscular Given 05/19/20 2148)  ondansetron (ZOFRAN-ODT) disintegrating tablet 4 mg (4 mg Oral Given 05/19/20 2146)    ED Course  I have reviewed the triage vital signs and  the nursing notes.  Pertinent labs & imaging results that were available during my care of the patient were reviewed by me and considered in my medical decision making (see chart for details).    MDM Rules/Calculators/A&P                           JESILYN EASOM is a 39 y.o. female-past medical history of allergies, anxiety, asthma that presents  emergency department today for shortness of breath for 1 day.  Patient does not appear to be in respiratory distress, speaking to me in full sentences.  Is satting at 98-100% on room air.  Covid test is positive.  Chest x-ray interpreted by me with no acute cardiopulmonary disease.  EKG interpreted by me without any signs of ischemia.  Will give Atrovent, albuterol and Decadron at this time and reevaluate.  Upon reevaluation with medications on board, patient states that she feels much better.  Will refer to [facility] since patient does want this at this time.  Patient to be discharged with symptomatic treatment provided.  Did discuss that she needs to follow CDC guidelines.  Patient instructed on when to come back to the emergency department if shortness of breath returns or worsens. Pt agreeable.   Doubt need for further emergent work up at this time. I explained the diagnosis and have given explicit precautions to return to the ER including for any other new or worsening symptoms. The patient understands and accepts the medical plan as it's been dictated and I have answered their questions. Discharge instructions concerning home care and prescriptions have been given. The patient is STABLE and is discharged to home in good condition.  I discussed this case with my attending physician who cosigned this note including patient's presenting symptoms, physical exam, and planned diagnostics and interventions. Attending physician stated agreement with plan or made changes to plan which were implemented.    Final Clinical Impression(s) / ED Diagnoses Final diagnoses:  COVID    Rx / DC Orders ED Discharge Orders    None       Alfredia Client, PA-C 05/20/20 1421    Davonna Belling, MD 05/21/20 2231

## 2020-05-20 ENCOUNTER — Telehealth: Payer: Self-pay

## 2020-05-20 ENCOUNTER — Other Ambulatory Visit: Payer: Self-pay | Admitting: Infectious Diseases

## 2020-05-20 ENCOUNTER — Telehealth: Payer: Self-pay | Admitting: Infectious Diseases

## 2020-05-20 ENCOUNTER — Encounter: Payer: Self-pay | Admitting: Internal Medicine

## 2020-05-20 DIAGNOSIS — U071 COVID-19: Secondary | ICD-10-CM

## 2020-05-20 DIAGNOSIS — E669 Obesity, unspecified: Secondary | ICD-10-CM

## 2020-05-20 NOTE — Progress Notes (Signed)
I connected by phone with Carla Buck on 05/20/2020 at 2:58 PM to discuss the potential use of a new treatment for mild to moderate COVID-19 viral infection in non-hospitalized patients.  This patient is a 39 y.o. female that meets the FDA criteria for Emergency Use Authorization of COVID monoclonal antibody casirivimab/imdevimab or bamlanivimab/eteseviamb.  Has a (+) direct SARS-CoV-2 viral test result  Has mild or moderate COVID-19   Is NOT hospitalized due to COVID-19  Is within 10 days of symptom onset  Has at least one of the high risk factor(s) for progression to severe COVID-19 and/or hospitalization as defined in EUA.  Specific high risk criteria : BMI > 25 and Other high risk medical condition per CDC:  SVI risk criteria    I have spoken and communicated the following to the patient or parent/caregiver regarding COVID monoclonal antibody treatment:  1. FDA has authorized the emergency use for the treatment of mild to moderate COVID-19 in adults and pediatric patients with positive results of direct SARS-CoV-2 viral testing who are 90 years of age and older weighing at least 40 kg, and who are at high risk for progressing to severe COVID-19 and/or hospitalization.  2. The significant known and potential risks and benefits of COVID monoclonal antibody, and the extent to which such potential risks and benefits are unknown.  3. Information on available alternative treatments and the risks and benefits of those alternatives, including clinical trials.  4. Patients treated with COVID monoclonal antibody should continue to self-isolate and use infection control measures (e.g., wear mask, isolate, social distance, avoid sharing personal items, clean and disinfect "high touch" surfaces, and frequent handwashing) according to CDC guidelines.   5. The patient or parent/caregiver has the option to accept or refuse COVID monoclonal antibody treatment.  After reviewing this information with  the patient, the patient has agreed to receive one of the available covid 19 monoclonal antibodies and will be provided an appropriate fact sheet prior to infusion. Carla Madeira, NP 05/20/2020 2:58 PM

## 2020-05-20 NOTE — Telephone Encounter (Signed)
Called to Discuss with patient about Covid symptoms and the use of the monoclonal antibody infusion for those with mild to moderate Covid symptoms and at a high risk of hospitalization.     Pt appears to qualify for this infusion due to co-morbid conditions and/or a member of an at-risk group in accordance with the FDA Emergency Use Authorization.    Patient symptoms just about a week in now.  Qualified for infusion with SVI criteria as well as BMI > 25.  Scheduled for Wednesday for infusion.

## 2020-05-20 NOTE — Telephone Encounter (Signed)
Brasher, Carla Buck, Carla Muta, MD 1 hour ago (10:02 AM)   Good Morning Dr. Verlin Fester,  I hope you are well. I have not had my allergy shots consistently since school started in August. I want to go back to getting my shots regularly; however, I was diagnosed with Covid yesterday (I was vaccinated prior) and I'm in quarantine until 06/02/20. When can I resume my shots, if possible? I appreciate your time and have a great day.  Sincerely, Carla Buck    Her last shot was 04/11/2020 blue 0.2cc

## 2020-05-20 NOTE — Telephone Encounter (Signed)
I spoke with the patient and answered her questions.

## 2020-05-20 NOTE — Progress Notes (Signed)
I spoke with our ID pharmacist regarding the flag for allergic reaction between Regen-COV and OMALIZUMAB - given the two different mechanisms of action this is thought to be very low. Loria and I discussed the 60 minute watch period following infusion to ensure no signs of allergy/anaphylaxisis.   May recommend to err on the side of caution to watch her for 75 minutes instead of the 60 to extend it out a bit just to be cautious. Patient has an epi pen for home use as well if needed.    Janene Madeira, MSN, NP-C [facility] [email or web address]

## 2020-05-21 ENCOUNTER — Ambulatory Visit (HOSPITAL_COMMUNITY)
Admission: RE | Admit: 2020-05-21 | Discharge: 2020-05-21 | Disposition: A | Discharge status: Home / Self Care | Payer: BC Managed Care – PPO | Source: Ambulatory Visit | Attending: Pulmonary Disease | Admitting: Pulmonary Disease

## 2020-05-21 ENCOUNTER — Telehealth: Payer: Self-pay

## 2020-05-21 DIAGNOSIS — E669 Obesity, unspecified: Secondary | ICD-10-CM | POA: Diagnosis present

## 2020-05-21 DIAGNOSIS — U071 COVID-19: Secondary | ICD-10-CM | POA: Diagnosis not present

## 2020-05-21 MED ORDER — EPINEPHRINE 0.3 MG/0.3ML IJ SOAJ: 0.3000 mg | Freq: Once | INTRAMUSCULAR | Status: DC | PRN

## 2020-05-21 MED ORDER — SODIUM CHLORIDE 0.9 % IV SOLN: INTRAVENOUS | Status: DC | PRN

## 2020-05-21 MED ORDER — ALBUTEROL SULFATE HFA 108 (90 BASE) MCG/ACT IN AERS: 2.0000 | INHALATION_SPRAY | Freq: Once | RESPIRATORY_TRACT | Status: DC | PRN

## 2020-05-21 MED ORDER — DIPHENHYDRAMINE HCL 50 MG/ML IJ SOLN: 50.0000 mg | Freq: Once | INTRAMUSCULAR | Status: DC | PRN

## 2020-05-21 MED ORDER — FAMOTIDINE IN NACL 20-0.9 MG/50ML-% IV SOLN: 20.0000 mg | Freq: Once | INTRAVENOUS | Status: DC | PRN

## 2020-05-21 MED ORDER — METHYLPREDNISOLONE SODIUM SUCC 125 MG IJ SOLR: 125.0000 mg | Freq: Once | INTRAMUSCULAR | Status: DC | PRN

## 2020-05-21 MED ORDER — SODIUM CHLORIDE 0.9 % IV SOLN
1200.0000 mg | Freq: Once | INTRAVENOUS | Status: AC
  Administered 2020-05-21: 1200 mg via INTRAVENOUS

## 2020-05-21 NOTE — Telephone Encounter (Signed)
Good morning Dr Verlin Fester,  I tried to go to the website you mentioned yesterday about vitamins but I could not get to the area you were talking about. Can you send me some guidance again about how to navigate the site and what you recommend I order? I appreciate you.  Carla Buck

## 2020-05-21 NOTE — Progress Notes (Signed)
  Diagnosis: COVID-19  Physician: Dr. Joya Gaskins  Procedure: [facility]fusion [facility] Med: casirivimab\imdevimab infusion - Provided patient with casirivimab\imdevimab fact sheet for patients, parents and caregivers prior to infusion.  Complications: No immediate complications noted.  Discharge: Discharged home   [place] 05/21/2020

## 2020-05-21 NOTE — Discharge Instructions (Signed)

## 2020-05-28 NOTE — Telephone Encounter (Signed)
I spoke with the patient and answered her questions.

## 2020-06-05 ENCOUNTER — Ambulatory Visit (INDEPENDENT_AMBULATORY_CARE_PROVIDER_SITE_OTHER): Payer: BC Managed Care – PPO | Admitting: *Deleted

## 2020-06-05 DIAGNOSIS — J309 Allergic rhinitis, unspecified: Secondary | ICD-10-CM

## 2020-06-12 ENCOUNTER — Ambulatory Visit (INDEPENDENT_AMBULATORY_CARE_PROVIDER_SITE_OTHER): Payer: BC Managed Care – PPO | Admitting: *Deleted

## 2020-06-12 DIAGNOSIS — J309 Allergic rhinitis, unspecified: Secondary | ICD-10-CM | POA: Diagnosis not present

## 2020-06-16 DIAGNOSIS — J3089 Other allergic rhinitis: Secondary | ICD-10-CM | POA: Diagnosis not present

## 2020-06-16 NOTE — Progress Notes (Signed)
VIALS EXP 06-16-21 °

## 2020-06-17 DIAGNOSIS — J3081 Allergic rhinitis due to animal (cat) (dog) hair and dander: Secondary | ICD-10-CM | POA: Diagnosis not present

## 2020-06-19 ENCOUNTER — Other Ambulatory Visit: Payer: Self-pay | Admitting: Neurology

## 2020-06-19 DIAGNOSIS — G43709 Chronic migraine without aura, not intractable, without status migrainosus: Secondary | ICD-10-CM

## 2020-06-25 ENCOUNTER — Other Ambulatory Visit: Payer: Self-pay | Admitting: Neurology

## 2020-06-30 ENCOUNTER — Telehealth: Payer: Self-pay

## 2020-06-30 NOTE — Telephone Encounter (Signed)
Lm for pt to call us back and sent pt a mychart message

## 2020-06-30 NOTE — Telephone Encounter (Signed)
Pt called back, pt made aware to avoid fish oil per Dr. Verlin Fester.

## 2020-06-30 NOTE — Telephone Encounter (Signed)
Good Afternoon Dr. Starling Manns,  I hope you are doing well. I have a question about fish oils. I am still having post Covid symptoms with brain fog and fatigue and one of my doctors recommended fish oils to help with the brain fog. With me being allergic to shellfish and Cod can you share with me what a good fish oil I can use? I appreciate you and have a good day.   Sincerely, Jodene Nam

## 2020-06-30 NOTE — Telephone Encounter (Signed)
Patient's with fish allergy should avoid fish oil.

## 2020-07-02 ENCOUNTER — Ambulatory Visit (INDEPENDENT_AMBULATORY_CARE_PROVIDER_SITE_OTHER): Payer: BC Managed Care – PPO

## 2020-07-02 DIAGNOSIS — J309 Allergic rhinitis, unspecified: Secondary | ICD-10-CM

## 2020-07-04 ENCOUNTER — Other Ambulatory Visit: Payer: Self-pay

## 2020-07-13 ENCOUNTER — Encounter: Payer: Self-pay | Admitting: Internal Medicine

## 2020-07-13 DIAGNOSIS — G8929 Other chronic pain: Secondary | ICD-10-CM

## 2020-07-13 DIAGNOSIS — M25561 Pain in right knee: Secondary | ICD-10-CM

## 2020-07-16 ENCOUNTER — Encounter: Payer: Self-pay | Admitting: Internal Medicine

## 2020-07-16 DIAGNOSIS — H918X3 Other specified hearing loss, bilateral: Secondary | ICD-10-CM

## 2020-07-17 ENCOUNTER — Encounter: Payer: Self-pay | Admitting: Sports Medicine

## 2020-07-17 ENCOUNTER — Ambulatory Visit (INDEPENDENT_AMBULATORY_CARE_PROVIDER_SITE_OTHER): Payer: BC Managed Care – PPO

## 2020-07-17 ENCOUNTER — Other Ambulatory Visit: Payer: Self-pay

## 2020-07-17 ENCOUNTER — Ambulatory Visit (INDEPENDENT_AMBULATORY_CARE_PROVIDER_SITE_OTHER): Payer: BC Managed Care – PPO | Admitting: Sports Medicine

## 2020-07-17 VITALS — BP 110/84 | Ht 63.0 in | Wt 193.0 lb

## 2020-07-17 DIAGNOSIS — G8929 Other chronic pain: Secondary | ICD-10-CM

## 2020-07-17 DIAGNOSIS — M25561 Pain in right knee: Secondary | ICD-10-CM

## 2020-07-17 DIAGNOSIS — M2241 Chondromalacia patellae, right knee: Secondary | ICD-10-CM

## 2020-07-17 DIAGNOSIS — J309 Allergic rhinitis, unspecified: Secondary | ICD-10-CM | POA: Diagnosis not present

## 2020-07-17 NOTE — Patient Instructions (Signed)
For your right knee pain we discussed the following:  -Referral to PT with a trial of McConnell taping -Body helix compression sleeve -4 week follow up  Please call us with any questions or concerns.

## 2020-07-17 NOTE — Progress Notes (Signed)
   Subjective:    Patient ID: Carla Buck, female    DOB: [birth year], 39 y.o.   MRN: [id number]  HPI chief complaint: Right knee pain  Patient comes in today with returning right knee pain.  She has been seen in our office for this previously.  An MRI done a little over a year ago showed some mild cartilage thinning of the lateral patellar facet and femoral trochlea.  The patella was noted to ride on the lateral femoral trochlea.  No obvious meniscal tear.  A cortisone injection at that time was temporarily helpful.  She has also been given home exercises and a body helix compression sleeve.  Body helix compression sleeve is helpful but is worn out.  Voltaren gel does help temporarily.  She is unable to take oral NSAIDs due to previous gastric bypass surgery.  Tylenol is not helpful.  She describes her pain as an achy discomfort in the anterior knee with a burning type pain underneath the kneecap.  Her pain is becoming more constant.  Although it is most noticeable with squatting, she does note pain with simple walking.  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  No prior knee surgeries.  No recent knee trauma.  She does note intermittent swelling. No locking.  Interim medical history reviewed Medications reviewed Allergies reviewed   Review of Systems    As above Objective:   Physical Exam  Well-developed, well-nourished.  No acute distress.  Right knee: Full range of motion.  1-2+ patellofemoral crepitus.  Trace effusion.  There is tethering of the patella laterally.  Pain with patellar compression.  There is also some tenderness to palpation along the lateral joint line.  No tenderness along the medial joint line.  Negative McMurray's.  Negative Thessaly's.  Knee is grossly stable to ligamentous exam.  Neurovascularly intact distally.  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  Symptoms are chronic and beginning to affect her activities of daily living.  I would like to start with a trial of formal physical therapy with instructions specifically to include McConnell taping.  Patient will follow up with me again in 4 weeks for reevaluation.  If she notices improvement with McConnell taping then I would consider referral to orthopedics for possible lateral release.  In the meantime, we will fit her with a new body helix compression sleeve to wear with activity.

## 2020-07-25 ENCOUNTER — Ambulatory Visit (INDEPENDENT_AMBULATORY_CARE_PROVIDER_SITE_OTHER): Payer: BC Managed Care – PPO

## 2020-07-25 DIAGNOSIS — J309 Allergic rhinitis, unspecified: Secondary | ICD-10-CM | POA: Diagnosis not present

## 2020-07-27 ENCOUNTER — Other Ambulatory Visit: Payer: Self-pay | Admitting: Neurology

## 2020-08-01 ENCOUNTER — Ambulatory Visit (INDEPENDENT_AMBULATORY_CARE_PROVIDER_SITE_OTHER): Payer: BC Managed Care – PPO

## 2020-08-01 DIAGNOSIS — J309 Allergic rhinitis, unspecified: Secondary | ICD-10-CM

## 2020-08-04 ENCOUNTER — Other Ambulatory Visit: Payer: Self-pay | Admitting: Neurology

## 2020-08-04 DIAGNOSIS — G43709 Chronic migraine without aura, not intractable, without status migrainosus: Secondary | ICD-10-CM

## 2020-08-05 ENCOUNTER — Ambulatory Visit: Payer: BC Managed Care – PPO | Admitting: Audiologist

## 2020-08-05 ENCOUNTER — Ambulatory Visit: Payer: BC Managed Care – PPO | Attending: Sports Medicine

## 2020-08-05 ENCOUNTER — Ambulatory Visit (INDEPENDENT_AMBULATORY_CARE_PROVIDER_SITE_OTHER): Payer: BC Managed Care – PPO | Admitting: *Deleted

## 2020-08-05 ENCOUNTER — Telehealth: Payer: Self-pay | Admitting: *Deleted

## 2020-08-05 ENCOUNTER — Other Ambulatory Visit: Payer: Self-pay

## 2020-08-05 DIAGNOSIS — M25561 Pain in right knee: Secondary | ICD-10-CM | POA: Insufficient documentation

## 2020-08-05 DIAGNOSIS — M228X1 Other disorders of patella, right knee: Secondary | ICD-10-CM | POA: Diagnosis present

## 2020-08-05 DIAGNOSIS — M545 Low back pain, unspecified: Secondary | ICD-10-CM | POA: Insufficient documentation

## 2020-08-05 DIAGNOSIS — J309 Allergic rhinitis, unspecified: Secondary | ICD-10-CM

## 2020-08-05 DIAGNOSIS — T161XXA Foreign body in right ear, initial encounter: Secondary | ICD-10-CM | POA: Diagnosis present

## 2020-08-05 DIAGNOSIS — G8929 Other chronic pain: Secondary | ICD-10-CM | POA: Insufficient documentation

## 2020-08-05 NOTE — Therapy (Addendum)
[place], [place], [address] Phone: [phone]   Fax:  [phone]  Physical Therapy Evaluation / discharge  Patient Details  Name: Carla Buck MRN: [id number] Date of Birth: 04/25/1981 Referring Provider (PT): Lilia Argue, DO   Encounter Date: 08/05/2020   PT End of Session - 08/05/20 0934     Visit Number 1    Number of Visits 12    Date for PT Re-Evaluation 09/12/20    Authorization Type BCBS    PT Start Time 0755    PT Stop Time 0830    PT Time Calculation (min) 35 min    Activity Tolerance Patient tolerated treatment well    Behavior During Therapy [facility] for tasks assessed/performed             Past Medical History:  Diagnosis Date   Allergy    Anxiety    Asthma    Depression    Eczema    Hearing impairment    Wears hearing aids   Migraine    Urticaria     Past Surgical History:  Procedure Laterality Date   ABDOMINAL HYSTERECTOMY  11/2015   fibroids   BREAST LUMPECTOMY     BREAST LUMPECTOMY Right 11/14/2019   GASTRIC BYPASS     OOPHORECTOMY Left 09/2018   torsion    There were no vitals filed for this visit.    Subjective Assessment - 08/05/20 0801     Subjective She reports RT knee . MD wanted trial of Mcconnell taping would be of benefit.   She started 10 years ago and worse in past 2 years.  AM stiffness. Swell daily  She is on her feet most of day. She has had injections with minimal benefit    Limitations Walking    How long can you sit comfortably? As needed    How long can you stand comfortably? As needed    How long can you walk comfortably? As needed    Diagnostic tests MRI : Some damage to patella , OA and scar tissue    Patient Stated Goals To be able to do activity with minimal pain    Currently in Pain? Yes    Pain Score 5     Pain Location Knee    Pain Orientation Right;Anterior;Lateral   to patella   Pain Descriptors / Indicators Aching    Pain  Type Chronic pain    Pain Onset More than a month ago    Pain Frequency Constant    Aggravating Factors  walking    Pain Relieving Factors heat, meds                [facility] PT Assessment - 08/05/20 0001       Assessment   Medical Diagnosis RT knee pain    Referring Provider (PT) Lilia Argue, DO    Onset Date/Surgical Date --   10 years ago   Next MD Visit 08/2020    Prior Therapy No      Precautions   Precautions None      Restrictions   Weight Bearing Restrictions No      Balance Screen   Has the patient fallen in the past 6 months No      Prior Function   Level of Independence Independent      Cognition   Overall Cognitive Status Within Functional Limits for tasks assessed      Observation/Other Assessments   Focus on Therapeutic  Outcomes (FOTO)  52% with possible improvement to  64%      ROM / Strength   AROM / PROM / Strength AROM;PROM;Strength      AROM   AROM Assessment Site Knee    Right/Left Knee Right;Left    Right Knee Extension 0    Right Knee Flexion 135    Left Knee Extension 0    Left Knee Flexion 135      Strength   Overall Strength Comments Bilaterally WNL Quads and hamstrings.  Hips 5/5      Flexibility   Soft Tissue Assessment /Muscle Length yes    Hamstrings 70 degrees bilaterally active      Palpation   Patella mobility Some lateral tracking RT patella      Ambulation/Gait   Gait Comments Normal                        Objective measurements completed on examination: See above findings.               PT Education - 08/05/20 0939     Education Details POC , HEP , FOTO score and possible progress, management of tapr to remove if skin irritated .  Wear 3 days and remove . showering is OK    Person(s) Educated Patient    Methods Explanation;Tactile cues;Verbal cues;Handout    Comprehension Verbalized understanding;Returned demonstration              PT Short Term Goals - 08/05/20 0903        PT SHORT TERM GOAL #1   Title She will be independent  in initial HEP    Time 3    Period Weeks    Status New      PT SHORT TERM GOAL #2   Title She will report taping decreased pain    Time 3    Period Weeks    Status New      PT SHORT TERM GOAL #3   Title She will be able to tape knee independently    Time 3    Period Weeks    Status New               PT Long Term Goals - 08/05/20 0931       PT LONG TERM GOAL #1   Title She will report RT knee pain as intermittant and  2/10 max with normal home and work tasks    Time 6    Period Weeks    Status New      PT LONG TERM GOAL #2   Title She will be independent with all hEP issued    Time 6    Period Weeks    Status New      PT LONG TERM GOAL #3   Title She will  improve FOTO to 64% limited    Time 6    Period Weeks    Status New                    Plan - 08/05/20 0840     Clinical Impression Statement [name] reports chronic RT knee pain with swelling . She reports she is not limited and she does all activity with the pain. Her ROM and strenght are normal but she does have some lateral tracking on RT patella.   She reports pain  on  the lateral patella. She wears a sleeve,. We taped her patella  medially with McConnell tape but she had lotioned her leg today so the tape may not stick. Will assess tape benefit next session.   She should improve with improved quad strength and control of patella to improve tracking should decrease her pain.    Personal Factors and Comorbidities Time since onset of injury/illness/exacerbation;Past/Current Experience    Examination-Activity Limitations Locomotion Level;Stairs;Stand;Squat    Examination-Participation Restrictions Occupation;Community Activity;Shop    Stability/Clinical Decision Making Stable/Uncomplicated    Clinical Decision Making Low    Rehab Potential Good    PT Frequency 2x / week    PT Duration 6 weeks    PT Treatment/Interventions Electrical  Stimulation;Iontophoresis 4mg /ml Dexamethasone;Moist Heat;Therapeutic exercise;Manual techniques;Patient/family education;Taping;Passive range of motion;Dry needling    PT Next Visit Plan Assess tape, SLR . Add to HEP for thigh and hip strength, manual , retape as needed    PT Home Exercise Plan SLR    Consulted and Agree with Plan of Care Patient             Patient will benefit from skilled therapeutic intervention in order to improve the following deficits and impairments:  Pain,Decreased activity tolerance  Visit Diagnosis: Chronic bilateral low back pain, unspecified whether sciatica present     Problem List Patient Active Problem List   Diagnosis Date Noted   History of recurrent UTIs 10/19/2019   Allergic conjunctivitis 07/30/2019   Food allergy 07/30/2019   Chronic migraine without aura without status migrainosus, not intractable 07/02/2019   Right knee pain 06/21/2019   History of migraine 06/19/2019   Moderate persistent asthma 06/19/2019   Perennial and seasonal allergic rhinitis 06/19/2019   Ganglion cyst of finger 06/19/2019   Eczema 06/19/2019   Obesity (BMI 30-39.9) 06/19/2019   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   Papilloma of both breasts 06/19/2019   Anxiety and depression 06/19/2019    Darrel Hoover  PT 08/05/2020, 9:40 AM  [facility] [address] [place], [place], [address] Phone: [phone]   Fax:  [phone]  Name: JACALYNN BUZZELL MRN: [id number] Date of Birth: [birth year]    Patient discharged due to not returning since their evaluation.  Kristoffer Leamon PT, DPT, LAT, ATC  02/09/21  11:46 AM

## 2020-08-05 NOTE — Telephone Encounter (Signed)
Let's add on Epi-rinse and see how she does.   Salvatore Marvel, MD Allergy and [place] [facility] [place]

## 2020-08-05 NOTE — Patient Instructions (Signed)
SLR 2x /day  2x10 reps

## 2020-08-05 NOTE — Telephone Encounter (Signed)
This has been updated in the patient's flow sheet

## 2020-08-05 NOTE — Procedures (Signed)
  Outpatient Audiology and [place], [place]  [address] [phone]  AUDIOLOGICAL  EVALUATION  NAME: Carla Buck     DOB:   1981/05/28      MRN: [id number]                                                                                     DATE: 08/05/2020     REFERENT: Ladell Pier, MD STATUS: Outpatient DIAGNOSIS: Foreign Object in right ear    History: [name] was seen for an audiological evaluation. [name] needs a new hearing evaluation has it has been a while since her last hearing test. She has a known asymmetric hearing loss. She is wearing a hearing aid in both ears, but only the right ear works. She has previously been evaluated by ENT and the hearing loss is of unknown origin.   Evaluation:   Otoscopy showed a hearing aid dome deep in the right external auditory canal. Left ear canal clear with visible cone of light.   Results:  Removal of the dome could not be performed as dome is behind the second bend of the canal. [name] needs to see an ENT physician for removal. No audiologic evaluation performed as results would likely be impacted by the object and not valid. Hearing test needs to be performed after removal. [name] reported understanding.   Recommendations: 1.   Referral to ENT Physician necessary due to foreign object in the right ear canal.    Alfonse Alpers  Audiologist, Au.D., CCC-A 08/05/2020  9:20 AM  Cc: Ladell Pier, MD

## 2020-08-05 NOTE — Telephone Encounter (Signed)
Patient came in today for her allergy injections and stated that she had a 3+ local reaction last week. She received 0.5mL of her Blue vials. Patient is on her second set of Blue vials due to inconsistency. She stated that she has always had local reactions since she started and she thought it was normal. She did state that she is currently taking Allegra. I advised for her to take 2 allegra the night before her injection since she takes her medication at night. Do you recommend mixing her down to silver or mixing in EpiWash? I did explain Epiwash to the patient and she understood the concept. Please advise since Dr. Verlin Fester is out of office. Thank You.

## 2020-08-06 DIAGNOSIS — H903 Sensorineural hearing loss, bilateral: Secondary | ICD-10-CM | POA: Insufficient documentation

## 2020-08-13 ENCOUNTER — Ambulatory Visit (INDEPENDENT_AMBULATORY_CARE_PROVIDER_SITE_OTHER): Payer: BC Managed Care – PPO

## 2020-08-13 DIAGNOSIS — J309 Allergic rhinitis, unspecified: Secondary | ICD-10-CM | POA: Diagnosis not present

## 2020-08-14 ENCOUNTER — Ambulatory Visit: Payer: BC Managed Care – PPO

## 2020-08-18 ENCOUNTER — Ambulatory Visit: Payer: BC Managed Care – PPO | Attending: Sports Medicine | Admitting: Physical Therapy

## 2020-08-18 ENCOUNTER — Telehealth: Payer: Self-pay | Admitting: Physical Therapy
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
## 2020-08-19 ENCOUNTER — Encounter: Payer: Self-pay | Admitting: Internal Medicine

## 2020-08-20 ENCOUNTER — Telehealth: Payer: Self-pay | Admitting: Sports Medicine

## 2020-08-20 NOTE — Telephone Encounter (Signed)
  I received an email from the patient over the Christmas break stating that the McConnell taping of her knee was successful.  I called her and left her a message on her voicemail explaining that she could continue with McConnell taping and physical therapy if it was successful.  I also explained that the success with McConnell taping may make her a candidate for a lateral retinacular release.  I asked her to contact me at the office if she would like for me to make a referral to orthopedics to discuss this further.  I would likely recommend Dr. Fara Chute for this procedure.

## 2020-08-21 ENCOUNTER — Telehealth: Payer: Self-pay | Admitting: Internal Medicine

## 2020-08-21 ENCOUNTER — Other Ambulatory Visit: Payer: Self-pay

## 2020-08-21 ENCOUNTER — Ambulatory Visit: Payer: BC Managed Care – PPO | Admitting: Sports Medicine

## 2020-08-21 VITALS — BP 116/88 | Ht 63.0 in | Wt 197.0 lb

## 2020-08-21 DIAGNOSIS — M2241 Chondromalacia patellae, right knee: Secondary | ICD-10-CM

## 2020-08-21 DIAGNOSIS — M25561 Pain in right knee: Secondary | ICD-10-CM | POA: Diagnosis not present

## 2020-08-21 DIAGNOSIS — G8929 Other chronic pain: Secondary | ICD-10-CM | POA: Diagnosis not present

## 2020-08-21 NOTE — Telephone Encounter (Signed)
Phone call placed to patient today to clarify MyChart message that was sent to me on 2 days ago.  Patient reported that she recently had a Covid test that was positive even though she did not have symptoms.  She reports being told when she had monoclonal antibody infusion in October during COVID infection that she would continue to test positive for Covid for 90-120 days post infusion.  Also states she was told to wait 90 to 120 days before getting the COVID booster.  She wanted to know whether she could get the COVID booster now or she should wait.  Initially I told her that she should wait for about 2 weeks past the 120-day mark. However after further consulting with a pulmonologist here on site who is involved with the monoclonal antibody infusion clinic, and was told that the information that she would test positive for Covid up to 120 days after monoclonal antibody infusion was incorrect.  Antigen test done with nasal swab should not still be positive. Patient tells me that on December 17 her children were exposed to an aunt who tested positive.  She had her children tested and they were negative but she tested positive.  Only symptom at that time that she was having was headache which she thought was due to a migraine and some fatigue that was a little worse than the usual fatigue that she was having ever since she was first infected with Covid back in October.  She denies any fever, cough or shortness of breath.  The fatigue is better.  Headache resolved.  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 I informed her that the test that she had back on December 17 was most likely a true positive from reinfection.  She is currently now asymptomatic and is basically completed what would have been a quarantine.  Advised that if it has been more than 90 days since the monoclonal antibody infusion, she can get the booster at this time.  Advised that even with vaccines nothing  is 100% and she should continue to practice social distancing and wearing a mask in public.  She expressed understanding.

## 2020-08-21 NOTE — Patient Instructions (Signed)
We are going to refer your to Dr. Jerl Santos for your right knee - Chondromalacia Patella  [facility] [address], [place] [phone]  Appt: Friday 09/19/20 @ 8:45 am.

## 2020-08-21 NOTE — Progress Notes (Signed)
Patient ID: Carla Buck, female   DOB: 1981/03/04, 40 y.o.   MRN: [id number]  Carla Buck comes in today for follow-up on right knee pain.  McConnell taping was extremely helpful.  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  She continues to endorse chronic anterior knee pain and knee swelling with activity.  The fact that she had good results with McConnell taping suggests that she may be a good surgical candidate for a lateral retinacular release.  I have recommended consultation with Dr. Fara Chute to discuss this further.  Patient is in agreement with that plan.  I will defer further work-up and treatment to the discretion of Dr. Fara Chute, and the patient will follow up with me as needed.

## 2020-08-22 ENCOUNTER — Ambulatory Visit (INDEPENDENT_AMBULATORY_CARE_PROVIDER_SITE_OTHER): Payer: BC Managed Care – PPO

## 2020-08-22 DIAGNOSIS — J309 Allergic rhinitis, unspecified: Secondary | ICD-10-CM

## 2020-08-28 ENCOUNTER — Telehealth: Payer: Self-pay

## 2020-08-28 ENCOUNTER — Ambulatory Visit: Payer: BC Managed Care – PPO

## 2020-08-28 NOTE — Telephone Encounter (Signed)
Left voicemail notifying patient of second no-show appointment. Reminded patient of attendance policy and that she is only allowed to schedule one visit at a time if she wishes to continue with PT.

## 2020-09-07 ENCOUNTER — Other Ambulatory Visit: Payer: Self-pay | Admitting: Internal Medicine

## 2020-09-18 ENCOUNTER — Ambulatory Visit (INDEPENDENT_AMBULATORY_CARE_PROVIDER_SITE_OTHER): Payer: BC Managed Care – PPO

## 2020-09-18 DIAGNOSIS — J309 Allergic rhinitis, unspecified: Secondary | ICD-10-CM | POA: Diagnosis not present

## 2020-09-26 ENCOUNTER — Other Ambulatory Visit: Payer: Self-pay | Admitting: Neurology

## 2020-09-26 ENCOUNTER — Ambulatory Visit (INDEPENDENT_AMBULATORY_CARE_PROVIDER_SITE_OTHER): Payer: BC Managed Care – PPO | Admitting: *Deleted

## 2020-09-26 DIAGNOSIS — J309 Allergic rhinitis, unspecified: Secondary | ICD-10-CM

## 2020-09-26 DIAGNOSIS — G43709 Chronic migraine without aura, not intractable, without status migrainosus: Secondary | ICD-10-CM

## 2020-09-30 DIAGNOSIS — G43709 Chronic migraine without aura, not intractable, without status migrainosus: Secondary | ICD-10-CM

## 2020-10-01 MED ORDER — AJOVY 225 MG/1.5ML ~~LOC~~ SOAJ: SUBCUTANEOUS | 0 refills | Status: DC

## 2020-10-02 ENCOUNTER — Ambulatory Visit (INDEPENDENT_AMBULATORY_CARE_PROVIDER_SITE_OTHER): Payer: BC Managed Care – PPO | Admitting: *Deleted

## 2020-10-02 DIAGNOSIS — J309 Allergic rhinitis, unspecified: Secondary | ICD-10-CM

## 2020-10-10 ENCOUNTER — Other Ambulatory Visit: Payer: Self-pay | Admitting: Neurology

## 2020-10-10 ENCOUNTER — Other Ambulatory Visit: Payer: Self-pay | Admitting: Internal Medicine

## 2020-10-10 DIAGNOSIS — G43709 Chronic migraine without aura, not intractable, without status migrainosus: Secondary | ICD-10-CM

## 2020-10-14 ENCOUNTER — Other Ambulatory Visit: Payer: Self-pay | Admitting: Neurology

## 2020-10-14 DIAGNOSIS — G43709 Chronic migraine without aura, not intractable, without status migrainosus: Secondary | ICD-10-CM

## 2020-10-14 MED ORDER — AJOVY 225 MG/1.5ML ~~LOC~~ SOAJ: SUBCUTANEOUS | 4 refills | Status: DC

## 2020-10-16 ENCOUNTER — Ambulatory Visit (INDEPENDENT_AMBULATORY_CARE_PROVIDER_SITE_OTHER): Payer: BC Managed Care – PPO

## 2020-10-16 DIAGNOSIS — J309 Allergic rhinitis, unspecified: Secondary | ICD-10-CM

## 2020-10-22 ENCOUNTER — Encounter: Payer: Self-pay | Admitting: Internal Medicine

## 2020-10-23 ENCOUNTER — Encounter: Payer: Self-pay | Admitting: Family Medicine

## 2020-10-23 ENCOUNTER — Ambulatory Visit (INDEPENDENT_AMBULATORY_CARE_PROVIDER_SITE_OTHER): Payer: BC Managed Care – PPO

## 2020-10-23 DIAGNOSIS — J309 Allergic rhinitis, unspecified: Secondary | ICD-10-CM

## 2020-10-27 ENCOUNTER — Ambulatory Visit: Payer: BC Managed Care – PPO | Attending: Family Medicine | Admitting: Family Medicine

## 2020-10-27 ENCOUNTER — Other Ambulatory Visit: Payer: Self-pay

## 2020-10-27 ENCOUNTER — Encounter: Payer: Self-pay | Admitting: Family Medicine

## 2020-10-27 DIAGNOSIS — J3089 Other allergic rhinitis: Secondary | ICD-10-CM

## 2020-10-27 DIAGNOSIS — J3489 Other specified disorders of nose and nasal sinuses: Secondary | ICD-10-CM

## 2020-10-27 MED ORDER — PREDNISONE 20 MG PO TABS: 20.0000 mg | ORAL_TABLET | Freq: Every day | ORAL | 0 refills | Status: DC
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
## 2020-10-30 ENCOUNTER — Other Ambulatory Visit: Payer: Self-pay | Admitting: Neurology

## 2020-10-30 DIAGNOSIS — G9601 Cranial cerebrospinal fluid leak, spontaneous: Secondary | ICD-10-CM

## 2020-10-30 DIAGNOSIS — R519 Headache, unspecified: Secondary | ICD-10-CM

## 2020-10-31 ENCOUNTER — Ambulatory Visit (INDEPENDENT_AMBULATORY_CARE_PROVIDER_SITE_OTHER): Payer: BC Managed Care – PPO | Admitting: *Deleted

## 2020-10-31 DIAGNOSIS — J309 Allergic rhinitis, unspecified: Secondary | ICD-10-CM

## 2020-11-03 ENCOUNTER — Telehealth: Payer: Self-pay | Admitting: Neurology

## 2020-11-03 NOTE — Telephone Encounter (Signed)
no to the covid question MR Brain w/wo contrast Dr. Ihor Dow Josem Kaufmann: [phone] 9exp. 11/03/20 to 05/01/21). Patient is scheduled at [facility] for 11/04/20.

## 2020-11-04 ENCOUNTER — Ambulatory Visit: Payer: BC Managed Care – PPO

## 2020-11-04 DIAGNOSIS — R519 Headache, unspecified: Secondary | ICD-10-CM

## 2020-11-04 DIAGNOSIS — G9601 Cranial cerebrospinal fluid leak, spontaneous: Secondary | ICD-10-CM

## 2020-11-04 MED ORDER — GADOBENATE DIMEGLUMINE 529 MG/ML IV SOLN
20.0000 mL | Freq: Once | INTRAVENOUS | Status: AC | PRN
  Administered 2020-11-04: 20 mL via INTRAVENOUS

## 2020-11-06 ENCOUNTER — Encounter: Payer: Self-pay | Admitting: Internal Medicine

## 2020-11-06 DIAGNOSIS — J3489 Other specified disorders of nose and nasal sinuses: Secondary | ICD-10-CM

## 2020-11-06 DIAGNOSIS — J320 Chronic maxillary sinusitis: Secondary | ICD-10-CM

## 2020-11-13 ENCOUNTER — Ambulatory Visit (INDEPENDENT_AMBULATORY_CARE_PROVIDER_SITE_OTHER): Payer: BC Managed Care – PPO | Admitting: *Deleted

## 2020-11-13 DIAGNOSIS — J309 Allergic rhinitis, unspecified: Secondary | ICD-10-CM

## 2020-11-18 ENCOUNTER — Encounter: Payer: Self-pay | Admitting: *Deleted

## 2020-11-19 NOTE — Telephone Encounter (Signed)
Received a PA for [employer]. Key: [id number]. This was initiated on Cover My Meds and this message from plan was received: "Your PA has been resolved, no additional PA is required. For further inquiries please contact the number on the back of the member prescription card. (Message [phone])".

## 2020-11-21 ENCOUNTER — Ambulatory Visit (INDEPENDENT_AMBULATORY_CARE_PROVIDER_SITE_OTHER): Payer: BC Managed Care – PPO | Admitting: *Deleted

## 2020-11-21 DIAGNOSIS — J309 Allergic rhinitis, unspecified: Secondary | ICD-10-CM

## 2020-12-04 ENCOUNTER — Ambulatory Visit (INDEPENDENT_AMBULATORY_CARE_PROVIDER_SITE_OTHER): Payer: BC Managed Care – PPO | Admitting: Otolaryngology

## 2020-12-04 ENCOUNTER — Ambulatory Visit (INDEPENDENT_AMBULATORY_CARE_PROVIDER_SITE_OTHER): Payer: BC Managed Care – PPO | Admitting: *Deleted

## 2020-12-04 ENCOUNTER — Encounter (INDEPENDENT_AMBULATORY_CARE_PROVIDER_SITE_OTHER): Payer: Self-pay | Admitting: Otolaryngology

## 2020-12-04 ENCOUNTER — Other Ambulatory Visit: Payer: Self-pay

## 2020-12-04 VITALS — Temp 96.3°F

## 2020-12-04 DIAGNOSIS — J310 Chronic rhinitis: Secondary | ICD-10-CM | POA: Diagnosis not present

## 2020-12-04 DIAGNOSIS — J309 Allergic rhinitis, unspecified: Secondary | ICD-10-CM

## 2020-12-04 DIAGNOSIS — J320 Chronic maxillary sinusitis: Secondary | ICD-10-CM

## 2020-12-04 DIAGNOSIS — J342 Deviated nasal septum: Secondary | ICD-10-CM | POA: Diagnosis not present

## 2020-12-04 NOTE — Progress Notes (Signed)
HPI: Carla Buck is a 40 y.o. female who presents is referred by [name], MD for evaluation of sinusitis noticed on recent MRI scan.  Patient apparently was complaining of chronic drainage from her nose.  There apparently was some concern about possible CSF leak and she had an MRI scan performed by neurology.  On review of the MRI scan by neurology she had what appeared to be a chronic left maxillary sinusitis.  She has not been treated with antibiotics.  At 1 point she was having some colored discharge from her nose but is not having colored discharge presently. I reviewed the MRI scan with the patient in the office today.  This showed diffuse mucoperiosteal thickening of the left maxillary sinus as well as a septal deviation to the left.  The remaining sinuses were clear.. Patient has a' history of chronic allergies as well as asthma and is presently using Xhance nasal spray and allergy medication Singulair and Xyzal.  She does state that her nose drips a lot.  And she has congestion in her nose which is generally little bit worse on the left side.  She is having no pain or pressure in the cheek area and no dental pain.  Past Medical History:  Diagnosis Date  . Allergy   . Anxiety   . Asthma   . Depression   . Eczema   . Hearing impairment    Wears hearing aids  . Migraine   . Urticaria    Past Surgical History:  Procedure Laterality Date  . ABDOMINAL HYSTERECTOMY  11/2015   fibroids  . BREAST LUMPECTOMY    . BREAST LUMPECTOMY Right 11/14/2019  . GASTRIC BYPASS    . OOPHORECTOMY Left 09/2018   torsion   Social History   Socioeconomic History  . Marital status: Married    Spouse name: Not on file  . Number of children: 3  . Years of education: Not on file  . Highest education level: Master's degree (e.g., MA, MS, MEng, MEd, MSW, MBA)  Occupational History  . Not on file  Tobacco Use  . Smoking status: Never Smoker  . Smokeless tobacco: Never Used  Vaping Use  .  Vaping Use: Never used  Substance and Sexual Activity  . Alcohol use: Yes    Comment: occasional  . Drug use: Never  . Sexual activity: Not on file  Other Topics Concern  . Not on file  Social History Narrative   Lives at home with her children    Right handed   Caffeine: 2-3 cups/day   Social Determinants of Health   Financial Resource Strain: Not on file  Food Insecurity: Not on file  Transportation Needs: Not on file  Physical Activity: Not on file  Stress: Not on file  Social Connections: Not on file   Family History  Problem Relation Age of Onset  . Hypertension Mother   . Allergic rhinitis Mother   . Asthma Mother   . Eczema Mother   . Depression Father   . Asthma Father   . Bipolar disorder Sister   . Asthma Sister   . Hypertension Maternal Grandmother   . Hypertension Paternal Grandmother   . Diabetes Paternal Grandmother   . Hypertension Paternal Grandfather   . Migraines Neg Hx    Allergies  Allergen Reactions  . Bee Venom Anaphylaxis  . Peanut-Containing Drug Products Anaphylaxis  . Pollen Extract Shortness Of Breath    Tree and grass   . Shellfish Allergy Anaphylaxis  .  Wasp Venom Anaphylaxis  . Xolair [Omalizumab] Anaphylaxis  . Amoxicillin Hives  . Bug Itch Releaf [Misc Natural Products] Hives    Roaches, ants and dustmites  . Contrave [Naltrexone-Bupropion Hcl Er] Hives  . Corn-Containing Products     GI unset  . Dust Mite Extract     Asthma trigger  . Gluten Meal     GI upset, HIVeS  . Iodine Hives  . Lidocaine Hives  . Orange Fruit [Citrus] Hives  . Penicillins Hives  . Sesame Oil Diarrhea    GI upset  . Soy Allergy Hives    GI upset  . Tetracaine Hives  . Tomato Hives   Prior to Admission medications   Medication Sig Start Date End Date Taking? Authorizing Provider  acetaminophen (TYLENOL) 325 MG tablet Take 325 mg by mouth every 6 (six) hours as needed.    [provider]  acyclovir (ZOVIRAX) 400 MG tablet Take 1  tablet (400 mg total) by mouth 3 (three) times daily. Patient not taking: Reported on 08/05/2020 10/19/19   Ladell Pier, MD  acyclovir (ZOVIRAX) 400 MG tablet Take 1 tablet (400 mg total) by mouth 2 (two) times daily. Patient not taking: Reported on 08/05/2020 10/19/19   Ladell Pier, MD  acyclovir (ZOVIRAX) 400 MG tablet TAKE ONE TABLET TWICE DAILY 10/10/20   Ladell Pier, MD  albuterol (VENTOLIN HFA) 108 (90 Base) MCG/ACT inhaler TAKE 2 PUFFS BY MOUTH EVERY 6 HOURS AS NEEDED FOR WHEEZE OR SHORTNESS OF BREATH 11/26/19   Bobbitt, Sedalia Muta, MD  ALPRAZolam Duanne Moron) 0.25 MG tablet Take 1-2 tabs (0.25mg -0.50mg ) 30-60 minutes before procedure. May repeat if needed.Do not drive. Patient not taking: No sig reported 07/02/19   Melvenia Beam, MD  azithromycin (ZITHROMAX Z-PAK) 250 MG tablet 2 tabs PO x 1 then 1 tab PO daily. Patient not taking: Reported on 08/05/2020 04/06/20   Ladell Pier, MD  Budeson-Glycopyrrol-Formoterol (BREZTRI AEROSPHERE) 160-9-4.8 MCG/ACT AERO Inhale 2 puffs into the lungs 2 (two) times daily. 07/30/19   Bobbitt, Sedalia Muta, MD  calcium carbonate (OSCAL) 1500 (600 Ca) MG TABS tablet Take 1,500 mg by mouth daily with breakfast.    [provider]  Crisaborole (EUCRISA) 2 % OINT Apply 1 application topically 2 (two) times daily. 07/30/19   Bobbitt, Sedalia Muta, MD  diphenhydrAMINE (BENADRYL) 25 mg capsule Take 25 mg by mouth every 6 (six) hours as needed.    [provider]  doxycycline (ADOXA) 100 MG tablet Take 1 tablet (100 mg total) by mouth 2 (two) times daily. Patient not taking: Reported on 08/05/2020 03/28/20   Ladell Pier, MD  EPINEPHrine (AUVI-Q) 0.3 mg/0.3 mL IJ SOAJ injection Inject 0.3 mLs (0.3 mg total) into the muscle as needed for anaphylaxis. 04/17/20   Bobbitt, Sedalia Muta, MD  EPINEPHrine 0.3 mg/0.3 mL IJ SOAJ injection Inject 0.3 mg into the muscle as needed for anaphylaxis.    [provider]  EPINEPHrine  0.3 mg/0.3 mL IJ SOAJ injection Inject 0.3 mLs (0.3 mg total) into the muscle as needed for anaphylaxis. 03/11/20   Valarie Merino, MD  ergocalciferol (VITAMIN D2) 1.25 MG (50000 UT) capsule Take 50,000 Units by mouth once a week.    [provider]  ferrous sulfate 325 (65 FE) MG tablet Take 325 mg by mouth daily with breakfast.    [provider]  fexofenadine-pseudoephedrine (ALLEGRA-D 24) 180-240 MG 24 hr tablet Take 1 tablet by mouth daily. Rotates each month between [place] and  Xyzal.    [provider]  fluticasone (FLOVENT HFA) 220 MCG/ACT inhaler Inhale 2 puffs into the lungs 2 (two) times daily as needed. 03/11/20   Bobbitt, Sedalia Muta, MD  fluticasone furoate-vilanterol (BREO ELLIPTA) 200-25 MCG/INH AEPB Inhale 1 puff into the lungs daily.    [provider]  Fluticasone Propionate (XHANCE) 93 MCG/ACT EXHU Place 2 sprays into the nose 2 (two) times daily. 04/23/20   Bobbitt, Sedalia Muta, MD  Fremanezumab-vfrm (AJOVY) 225 MG/1.5ML SOAJ INJECT 1 PEN INTO THE SKIN EVERY 30 DAYS. APPOINTMENT NEEDED FOR FURTHER REFILLS. CALL [phone]. 10/14/20   Melvenia Beam, MD  ipratropium-albuterol (DUONEB) 0.5-2.5 (3) MG/3ML SOLN Take 3 mLs by nebulization every 6 (six) hours as needed.    [provider]  levocetirizine (XYZAL) 5 MG tablet Take 1 tablet (5 mg total) by mouth every evening. 11/19/19   Bobbitt, Sedalia Muta, MD  montelukast (SINGULAIR) 10 MG tablet Take 10 mg by mouth at bedtime.    [provider]  montelukast (SINGULAIR) 10 MG tablet Take 1 tablet (10 mg total) by mouth at bedtime. 07/30/19   Bobbitt, Sedalia Muta, MD  Multiple Vitamins-Minerals (BARIATRIC MULTIVITAMINS/IRON PO) Take 1 tablet by mouth.    [provider]  nitrofurantoin (MACRODANTIN) 50 MG capsule Take 50 mg by mouth daily. 09/14/19   [provider]  NURTEC 75 MG TBDP TAKE 1 TABLET BY MOUTH DAILY AS NEEDED TAKE AS CLOSE TO ONSET OF MIGRAINE AS  POSSIBLE 10/11/20   Melvenia Beam, MD  Olopatadine HCl (PATADAY) 0.2 % SOLN Place 1 drop into both eyes 2 (two) times daily. 08/24/19   Valentina Shaggy, MD  ondansetron (ZOFRAN-ODT) 4 MG disintegrating tablet Take 1-2 tablets (4-8 mg total) by mouth every 8 (eight) hours as needed for nausea. Appointment needed for further refills. 08/04/20   Melvenia Beam, MD  predniSONE (DELTASONE) 10 MG tablet Take 2 tablets (20 mg total) by mouth daily. Patient not taking: Reported on 08/05/2020 03/11/20   Valarie Merino, MD  predniSONE (DELTASONE) 20 MG tablet Take 1 tablet (20 mg total) by mouth daily with breakfast. Patient not taking: Reported on 12/04/2020 10/27/20   Charlott Rakes, MD  propranolol (INDERAL) 10 MG tablet Take 10 mg by mouth as needed.    [provider]  rizatriptan (MAXALT-MLT) 10 MG disintegrating tablet TAKE 1 TABLET BY MOUTH AS NEEDED FOR MIGRAINE. MAY REPEAT IN 2 HOURS IF NEEDED 06/25/20   Melvenia Beam, MD  tamoxifen (NOLVADEX) 20 MG tablet Take 50 mg by mouth daily.     [provider]  traZODone (DESYREL) 150 MG tablet Take 150 mg by mouth at bedtime as needed for sleep.    [provider]  triamcinolone ointment (KENALOG) 0.1 % Apply 1 application topically 2 (two) times daily. 07/30/19   Bobbitt, Sedalia Muta, MD  triamcinolone ointment (KENALOG) 0.1 % Apply 1 application topically 2 (two) times daily as needed. 12/25/19   Bobbitt, Sedalia Muta, MD  valACYclovir (VALTREX) 500 MG tablet Take 1 tablet (500 mg total) by mouth 2 (two) times daily. 10/05/19   Ladell Pier, MD  venlafaxine (EFFEXOR) 100 MG tablet Take 100 mg by mouth 2 (two) times daily.    [provider]  verapamil (CALAN) 80 MG tablet TAKE 1 TABLET (80 MG TOTAL) BY MOUTH 3 (THREE) TIMES DAILY. 07/27/20   Melvenia Beam, MD     Positive ROS: Otherwise negative  All other systems have been reviewed and were  otherwise negative with the exception of those  mentioned in the HPI and as above.  Physical Exam: Constitutional: Alert, well-appearing, no acute distress Ears: External ears without lesions or tenderness. Ear canals are clear bilaterally with intact, clear TMs.  Nasal: External nose without lesions. Septum is deviated to the left with moderate rhinitis..  After decongesting the nose with Afrin nasal endoscopy was performed.  The right middle meatus region was clear.  The left middle meatus was very edematous with thick mucus discharge from the left middle meatus.  The nasopharynx was clear with no obvious mucopurulent discharge noted down the nasopharynx.  She had moderate mucosal edema worse on the left side. Oral: Lips and gums without lesions. Tongue and palate mucosa without lesions. Posterior oropharynx clear. Neck: No palpable adenopathy or masses Respiratory: Breathing comfortably  Skin: No facial/neck lesions or rash noted.  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  Plan: She apparently is allergic to penicillin which causes hives.  She has not been treated with any antibiotics. I went ahead and prescribed Levaquin 500 mg daily for 10 days. I recommended regular use of her Xhance. I suggested following up with her allergist concerning for the treatment of her allergies. Reviewed with her that the MRI scan showed mucoperiosteal thickening within the maxillary sinus on the left side but no air-fluid level and no opacification of the sinus.  The remaining sinuses were relatively clear. 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   Radene Journey, MD   CC:

## 2020-12-10 ENCOUNTER — Ambulatory Visit (HOSPITAL_COMMUNITY)
Admission: EM | Admit: 2020-12-10 | Discharge: 2020-12-10 | Disposition: A | Discharge status: Home / Self Care | Payer: BC Managed Care – PPO | Attending: Family Medicine | Admitting: Family Medicine

## 2020-12-10 ENCOUNTER — Other Ambulatory Visit: Payer: Self-pay

## 2020-12-10 ENCOUNTER — Emergency Department (HOSPITAL_BASED_OUTPATIENT_CLINIC_OR_DEPARTMENT_OTHER)
Admission: EM | Admit: 2020-12-10 | Discharge: 2020-12-11 | Disposition: A | Discharge status: Home / Self Care | Payer: BC Managed Care – PPO | Attending: Emergency Medicine | Admitting: Emergency Medicine

## 2020-12-10 ENCOUNTER — Emergency Department (HOSPITAL_BASED_OUTPATIENT_CLINIC_OR_DEPARTMENT_OTHER): Payer: BC Managed Care – PPO

## 2020-12-10 ENCOUNTER — Encounter (HOSPITAL_COMMUNITY): Payer: Self-pay

## 2020-12-10 ENCOUNTER — Encounter (HOSPITAL_BASED_OUTPATIENT_CLINIC_OR_DEPARTMENT_OTHER): Payer: Self-pay

## 2020-12-10 DIAGNOSIS — R52 Pain, unspecified: Secondary | ICD-10-CM

## 2020-12-10 DIAGNOSIS — R0789 Other chest pain: Secondary | ICD-10-CM | POA: Diagnosis not present

## 2020-12-10 DIAGNOSIS — S0502XA Injury of conjunctiva and corneal abrasion without foreign body, left eye, initial encounter: Secondary | ICD-10-CM | POA: Diagnosis not present

## 2020-12-10 DIAGNOSIS — Z91010 Allergy to peanuts: Secondary | ICD-10-CM | POA: Insufficient documentation

## 2020-12-10 DIAGNOSIS — J45909 Unspecified asthma, uncomplicated: Secondary | ICD-10-CM | POA: Insufficient documentation

## 2020-12-10 DIAGNOSIS — S0990XA Unspecified injury of head, initial encounter: Secondary | ICD-10-CM | POA: Diagnosis present

## 2020-12-10 DIAGNOSIS — M62838 Other muscle spasm: Secondary | ICD-10-CM | POA: Insufficient documentation

## 2020-12-10 DIAGNOSIS — Y92410 Unspecified street and highway as the place of occurrence of the external cause: Secondary | ICD-10-CM | POA: Insufficient documentation

## 2020-12-10 DIAGNOSIS — S058X2A Other injuries of left eye and orbit, initial encounter: Secondary | ICD-10-CM

## 2020-12-10 DIAGNOSIS — S060X0A Concussion without loss of consciousness, initial encounter: Secondary | ICD-10-CM | POA: Diagnosis not present

## 2020-12-10 MED ORDER — ACETAMINOPHEN 325 MG PO TABS: 650.0000 mg | ORAL_TABLET | Freq: Four times a day (QID) | ORAL | 0 refills | Status: DC | PRN

## 2020-12-10 MED ORDER — ERYTHROMYCIN 5 MG/GM OP OINT: TOPICAL_OINTMENT | OPHTHALMIC | 0 refills | Status: DC

## 2020-12-10 MED ORDER — IBUPROFEN 600 MG PO TABS: 600.0000 mg | ORAL_TABLET | Freq: Four times a day (QID) | ORAL | 0 refills | Status: DC | PRN

## 2020-12-10 MED ORDER — OXYCODONE-ACETAMINOPHEN 5-325 MG PO TABS
1.0000 | ORAL_TABLET | Freq: Once | ORAL | Status: AC
  Administered 2020-12-10: 1 via ORAL
  Filled 2020-12-10: qty 1

## 2020-12-10 MED ORDER — ERYTHROMYCIN 5 MG/GM OP OINT
TOPICAL_OINTMENT | Freq: Once | OPHTHALMIC | Status: AC
  Filled 2020-12-10: qty 3.5

## 2020-12-10 MED ORDER — CYCLOBENZAPRINE HCL 10 MG PO TABS: 10.0000 mg | ORAL_TABLET | Freq: Two times a day (BID) | ORAL | 0 refills | Status: AC | PRN

## 2020-12-10 MED ORDER — OXYCODONE HCL 5 MG PO TABS: 5.0000 mg | ORAL_TABLET | Freq: Four times a day (QID) | ORAL | 0 refills | Status: AC | PRN

## 2020-12-10 MED ORDER — ONDANSETRON 4 MG PO TBDP
4.0000 mg | ORAL_TABLET | Freq: Once | ORAL | Status: AC
  Administered 2020-12-10: 4 mg via ORAL
  Filled 2020-12-10: qty 1

## 2020-12-10 MED ORDER — FLUORESCEIN SODIUM 1 MG OP STRP: 1.0000 | ORAL_STRIP | Freq: Once | OPHTHALMIC | Status: AC

## 2020-12-10 MED ORDER — FLUORESCEIN SODIUM 1 MG OP STRP
ORAL_STRIP | OPHTHALMIC | Status: AC
  Administered 2020-12-10: 1 via OPHTHALMIC
  Filled 2020-12-10: qty 1
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
## 2020-12-10 NOTE — Discharge Instructions (Addendum)
You have a small abrasion or scratch to your left eye.  You will need to use the eye ointment 4 times a day for the next 7 days as prescribed.  You can wear glasses, but do not use any contacts.  You will likely be very sore for the next week.  I gave you a work note.  Try to keep hydrated and rest at home.  We talked about the possibility that you have a concussion.  I included information to read about.  Most people recover from a concussion in 30 days.  You should not drive if you feel dizzy, lightheaded, have difficulty staying.

## 2020-12-11 ENCOUNTER — Ambulatory Visit: Payer: BC Managed Care – PPO | Admitting: Physician Assistant

## 2020-12-11 ENCOUNTER — Encounter: Payer: Self-pay | Admitting: Internal Medicine

## 2020-12-11 ENCOUNTER — Other Ambulatory Visit: Payer: Self-pay

## 2020-12-11 DIAGNOSIS — M545 Low back pain, unspecified: Secondary | ICD-10-CM | POA: Diagnosis not present

## 2020-12-11 DIAGNOSIS — S0502XA Injury of conjunctiva and corneal abrasion without foreign body, left eye, initial encounter: Secondary | ICD-10-CM | POA: Diagnosis not present
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
## 2020-12-12 DIAGNOSIS — S0502XA Injury of conjunctiva and corneal abrasion without foreign body, left eye, initial encounter: Secondary | ICD-10-CM | POA: Insufficient documentation

## 2020-12-18 ENCOUNTER — Ambulatory Visit: Payer: BC Managed Care – PPO | Admitting: Family Medicine

## 2020-12-18 DIAGNOSIS — J309 Allergic rhinitis, unspecified: Secondary | ICD-10-CM

## 2020-12-18 NOTE — Progress Notes (Deleted)
   Person [facility] [id number] Dept: [phone]  FOLLOW UP NOTE  Patient ID: Carla Buck, female    DOB: [birth year]  Age: 40 y.o. MRN: [id number] Date of Office Visit: 12/18/2020  Assessment  Chief Complaint: No chief complaint on file.  HPI Carla [facility]    Drug Allergies:  Allergies  Allergen Reactions  . Bee Venom Anaphylaxis  . Peanut-Containing Drug Products Anaphylaxis  . Pollen Extract Shortness Of Breath    Tree and grass   . Shellfish Allergy Anaphylaxis  . Wasp Venom Anaphylaxis  . Xolair [Omalizumab] Anaphylaxis  . Amoxicillin Hives  . Bug Itch Releaf [Misc Natural Products] Hives    Roaches, ants and dustmites  . Contrave [Naltrexone-Bupropion Hcl Er] Hives  . Corn-Containing Products     GI unset  . Dust Mite Extract     Asthma trigger  . Gluten Meal     GI upset, HIVeS  . Iodine Hives  . Lidocaine Hives  . Orange Fruit [Citrus] Hives  . Penicillins Hives  . Sesame Oil Diarrhea    GI upset  . Soy Allergy Hives    GI upset  . Tetracaine Hives  . Tomato Hives    Physical Exam: There were no vitals taken for this visit.   Physical Exam  Diagnostics:    Assessment and Plan: No diagnosis found.  No orders of the defined types were placed in this encounter.   There are no Patient Instructions on file for this visit.  No follow-ups on file.    Thank you for the opportunity to care for this patient.  Please do not hesitate to contact me with questions.  Carla Morgan, FNP [facility]

## 2020-12-18 NOTE — Patient Instructions (Incomplete)
Asthma Continue montelukast 10 mg once a day to prevent cough or wheeze Continue Breztri 2 puffs twice a day with a spacer to prevent cough or wheeze Continue albuterol as once every 4 hours as needed for cough or wheeze Continue albuterol 2 puffs 5 to 15 minutes before activity to decrease cough or wheeze For asthma flare begin Flovent 2 20-2 puffs twice a day for 2 weeks or until cough and wheeze free.  Call the clinic if you need to start this medication  Allergic rhinitis Continue allergen avoidance measures directed toward pollens, dust mite, pets, and cockroach as listed below Continue levocetirizine 5 mg once a day as needed for runny nose or itch Continue Xhance 2 sprays in each nostril twice a day as needed for nasal symptoms Consider saline nasal rinses as needed for nasal symptoms. Use this before any medicated nasal sprays for best result  Allergic conjunctivitis Continue olopatadine 1 drop in each eye once a day as needed for red or itchy eyes  Atopic dermatitis Continue twice a day moisturizing routine For red itchy areas use Eucrisa twice a day as needed For stubborn red itchy areas below your face use triamcinolone 0.1% ointment twice a day as needed  Food allergy Continue to avoid fish, shellfish, peanut, tree nut, soy, and sesame. In case of an allergic reaction, give Benadryl *** {Blank single:19197::"teaspoonful","teaspoonfuls","capsules"} every {blank single:19197::"4","6"} hours, and if life-threatening symptoms occur, inject with {Blank single:19197::"EpiPen 0.3 mg","EpiPen Jr. 0.15 mg","AuviQ 0.3 mg","AuviQ 0.15 mg","AuviQ 0.10 mg"}.  Call the clinic if this treatment plan is not working well for you  Follow up in *** or sooner if needed.

## 2020-12-19 ENCOUNTER — Ambulatory Visit (INDEPENDENT_AMBULATORY_CARE_PROVIDER_SITE_OTHER): Payer: BC Managed Care – PPO

## 2020-12-19 DIAGNOSIS — J309 Allergic rhinitis, unspecified: Secondary | ICD-10-CM | POA: Diagnosis not present

## 2020-12-26 ENCOUNTER — Ambulatory Visit (INDEPENDENT_AMBULATORY_CARE_PROVIDER_SITE_OTHER): Payer: BC Managed Care – PPO

## 2020-12-26 DIAGNOSIS — J309 Allergic rhinitis, unspecified: Secondary | ICD-10-CM

## 2020-12-26 NOTE — Patient Instructions (Addendum)
Food allergy Avoid seeds, shellfish, fish, peanuts, tree nuts, soy and other foods that cause bothersome symptoms. In case of an allergic reaction, give Benadryl 4 teaspoonfuls every 4 hours, and if life-threatening symptoms occur, inject with AuviQ 0.3 mg.  Moderate persistent asthma Start Flovent 220 mcg 2 puffs twice a day with spacer to help prevent cough and wheeze Stop Breztri due to jittery feeling Continue montelukast 10 mg once a day to help prevent cough and wheeze Continue DuoNeb May use albuterol 2 puffs every 4-6 hours as needed for cough, wheeze, tightness in chest, shortness of breath.  Also may use 2 puffs 5 to 15 minutes prior to exercise Asthma control goals:   Full participation in all desired activities (may need albuterol before activity)  Albuterol use two time or less a week on average (not counting use with activity)  Cough interfering with sleep two time or less a month  Oral steroids no more than once a year  No hospitalizations  Perennial and seasonal allergic rhinitis Start azelastine 1-2 sprays each nostril one to two times a day as needed for runny nose/drainage down throat Continue immunotherapy per protocol and have access to epinephrine autoinjector device Continue Xhance 2 sprays each nostril twice a day as needed for stuffiness Continue levocetirizine (Xyzal) 5 mg once a day as needed for runny nose/itching  Allergic conjunctivitis Continue Pataday 1 drop each eye once a day as needed for itchy watery eyes. Please call make sure it is okay with your ophthalmologist to use Pataday eyedrops as needed  May use Natural Tears, and eye lubricant as needed  Eczema Continue daily moisturizing program Continue Eucrisa 2% ointment using 1 application twice a day as needed to red itchy areas Continue triamcinolone 0.1% ointment using 1 application twice a day as needed to red itchy areas.  Do not use on face, neck, groin, or armpit region.  Please let us  know if this treatment plan is not working well for you Schedule a follow-up appointment in 4 weeks or sooner if needed

## 2020-12-29 ENCOUNTER — Ambulatory Visit: Payer: BC Managed Care – PPO | Admitting: Family

## 2020-12-29 ENCOUNTER — Other Ambulatory Visit: Payer: Self-pay

## 2020-12-29 ENCOUNTER — Encounter: Payer: Self-pay | Admitting: Family

## 2020-12-29 VITALS — BP 118/82 | HR 76 | Temp 98.0°F | Resp 16 | Ht 63.0 in | Wt 207.8 lb

## 2020-12-29 DIAGNOSIS — J4540 Moderate persistent asthma, uncomplicated: Secondary | ICD-10-CM | POA: Diagnosis not present

## 2020-12-29 DIAGNOSIS — H1013 Acute atopic conjunctivitis, bilateral: Secondary | ICD-10-CM

## 2020-12-29 DIAGNOSIS — J3089 Other allergic rhinitis: Secondary | ICD-10-CM | POA: Diagnosis not present

## 2020-12-29 DIAGNOSIS — T7800XA Anaphylactic reaction due to unspecified food, initial encounter: Secondary | ICD-10-CM

## 2020-12-29 DIAGNOSIS — L309 Dermatitis, unspecified: Secondary | ICD-10-CM | POA: Diagnosis not present

## 2020-12-29 MED ORDER — EPINEPHRINE 0.3 MG/0.3ML IJ SOAJ: 0.3000 mg | INTRAMUSCULAR | 1 refills | Status: DC | PRN

## 2020-12-29 MED ORDER — FLOVENT HFA 220 MCG/ACT IN AERO: 2.0000 | INHALATION_SPRAY | Freq: Two times a day (BID) | RESPIRATORY_TRACT | 5 refills | Status: DC

## 2020-12-29 MED ORDER — ALBUTEROL SULFATE HFA 108 (90 BASE) MCG/ACT IN AERS: INHALATION_SPRAY | RESPIRATORY_TRACT | 1 refills | Status: DC

## 2020-12-29 MED ORDER — AZELASTINE HCL 0.15 % NA SOLN: NASAL | 3 refills | Status: DC
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
## 2021-01-01 ENCOUNTER — Ambulatory Visit (INDEPENDENT_AMBULATORY_CARE_PROVIDER_SITE_OTHER): Payer: BC Managed Care – PPO

## 2021-01-01 DIAGNOSIS — J309 Allergic rhinitis, unspecified: Secondary | ICD-10-CM | POA: Diagnosis not present

## 2021-01-09 ENCOUNTER — Other Ambulatory Visit: Payer: Self-pay | Admitting: Internal Medicine

## 2021-01-09 NOTE — Telephone Encounter (Signed)
Requested medications are due for refill today yes  Requested medications are on the active medication list yes  Last refill 2/25  Last visit 3/14  Future visit scheduled no  Notes to clinic She has 3 current rx for this med, please assess.

## 2021-01-23 ENCOUNTER — Ambulatory Visit (INDEPENDENT_AMBULATORY_CARE_PROVIDER_SITE_OTHER): Payer: BC Managed Care – PPO | Admitting: *Deleted

## 2021-01-23 DIAGNOSIS — J309 Allergic rhinitis, unspecified: Secondary | ICD-10-CM

## 2021-01-23 NOTE — Patient Instructions (Addendum)
Food allergy Avoid seeds, shellfish, fish, peanuts, tree nuts, soy and other foods that cause bothersome symptoms. In case of an allergic reaction, give Benadryl 4 teaspoonfuls every 4 hours, and if life-threatening symptoms occur, inject with AuviQ 0.3 mg.  Moderate persistent asthma Stop Flovent 220 mcg  Start Symbicort 160/4.5 mcg 2 puffs twice a day with spacer to help prevent cough and wheeze Continue montelukast 10 mg once a day to help prevent cough and wheeze Continue DuoNeb as needed May use albuterol 2 puffs every 4-6 hours as needed for cough, wheeze, tightness in chest, shortness of breath.  Also may use 2 puffs 5 to 15 minutes prior to exercise Asthma control goals:  Full participation in all desired activities (may need albuterol before activity) Albuterol use two time or less a week on average (not counting use with activity) Cough interfering with sleep two time or less a month Oral steroids no more than once a year No hospitalizations  Perennial and seasonal allergic rhinitis Continue azelastine 1-2 sprays each nostril one to two times a day as needed for runny nose/drainage down throat Continue immunotherapy per protocol and have access to epinephrine autoinjector device Continue Xhance 2 sprays each nostril twice a day as needed for stuffiness Continue levocetirizine (Xyzal) 5 mg once a day as needed for runny nose/itching  Allergic conjunctivitis Continue Pataday 1 drop each eye once a day as needed for itchy watery eyes. Please call make sure it is okay with your ophthalmologist to use Pataday eyedrops as needed after your surgery May use Natural Tears, and eye lubricant as needed  Eczema Continue daily moisturizing program Continue Eucrisa 2% ointment using 1 application twice a day as needed to red itchy areas Continue triamcinolone 0.1% ointment using 1 application twice a day as needed to red itchy areas.  Do not use on face, neck, groin, or armpit  region.  Please let us know if this treatment plan is not working well for you Schedule a follow-up appointment in 2 months or sooner if needed

## 2021-01-26 ENCOUNTER — Other Ambulatory Visit: Payer: Self-pay

## 2021-01-26 ENCOUNTER — Encounter: Payer: Self-pay | Admitting: Family

## 2021-01-26 ENCOUNTER — Ambulatory Visit: Payer: BC Managed Care – PPO | Admitting: Family

## 2021-01-26 ENCOUNTER — Other Ambulatory Visit: Payer: Self-pay | Admitting: Family

## 2021-01-26 VITALS — BP 118/78 | HR 87 | Temp 97.7°F | Resp 16 | Ht 63.5 in | Wt 207.0 lb

## 2021-01-26 DIAGNOSIS — J3089 Other allergic rhinitis: Secondary | ICD-10-CM

## 2021-01-26 DIAGNOSIS — T7800XA Anaphylactic reaction due to unspecified food, initial encounter: Secondary | ICD-10-CM

## 2021-01-26 DIAGNOSIS — J4540 Moderate persistent asthma, uncomplicated: Secondary | ICD-10-CM | POA: Diagnosis not present

## 2021-01-26 DIAGNOSIS — L308 Other specified dermatitis: Secondary | ICD-10-CM

## 2021-01-26 DIAGNOSIS — H1013 Acute atopic conjunctivitis, bilateral: Secondary | ICD-10-CM | POA: Diagnosis not present

## 2021-01-26 MED ORDER — TRIAMCINOLONE ACETONIDE 0.1 % EX OINT: TOPICAL_OINTMENT | CUTANEOUS | 2 refills | Status: DC

## 2021-01-26 MED ORDER — BUDESONIDE-FORMOTEROL FUMARATE 160-4.5 MCG/ACT IN AERO: INHALATION_SPRAY | RESPIRATORY_TRACT | 2 refills | Status: DC

## 2021-01-26 NOTE — [facility] Notes (Signed)
Carla Buck [id number] Dept: [phone]  FOLLOW UP NOTE  Patient ID: Carla Buck, female    DOB: 03-26-1981  Age: 40 y.o. MRN: [id number] Date of Office Visit: 01/26/2021  Assessment  Chief Complaint: Asthma (ACT 21 SOB often due to heat. She has to use her albuterol inhaler before being active outside. ), Allergic Rhinitis  (No flares ), and Eczema (Breaks out on her hands eucrisa helps )  HPI [name] is a 40 year old female who presents today for follow-up of moderate persistent asthma, perennial and seasonal allergic rhinitis, food allergy, allergic conjunctivitis, and eczema.  She was last seen on Dec 29, 2020 by Althea Charon, FNP.  Moderate persistent asthma is reported as doing better since consistently using Flovent 220 mcg 2 puffs twice a day with a spacer, montelukast 10 mg once a day, and albuterol as needed.  She reports shortness of breath with heat and nocturnal awakenings only if it is overly hot or she has been outside during the day.  She denies any coughing, wheezing, and tightness in her chest.  Since her last office visit she has not required any trips to the emergency room or urgent care due to breathing problems and has not required any systemic steroids.  She has been using her albuterol inhaler approximately 5 days a week if counting before she goes outside in the heat.  She will also use it prior to exercise. She is exercising 3 days a week. She has not had to use DuoNeb since last October when she had COVID-19.  Perennial and seasonal allergic rhinitis is reported as controlled with azelastine nasal spray as needed, immunotherapy per protocol, Xhance 2 sprays each nostril twice a day, and levocetirizine 5 mg at night.  She does feel like her allergy injections are helping and she has not had any large local reactions since adding the epi wash.  She denies any rhinorrhea, nasal congestion, and postnasal drip.  She has not had any sinus  infections since we last saw her.  Allergic conjunctivitis is reported as controlled.  She denies any itchy watery eyes and is currently not using Pataday eyedrops.  She reports that she is getting ready to have an eye procedure next week on her right eye so she has not been using Pataday eyedrops.  She continues to avoid seeds, shellfish, fish, peanuts, tree nuts, soy and other foods that cause bothersome symptoms.  She has not had any accidental ingestion or use of her Auvi-Q device.  Eczema is reported as moderately controlled with Eucrisa 2% ointment and Aquaphor lotion.  She is out of triamcinolone 0.1% ointment.  She reports having occasional flares on her hands and that Eucrisa 2% ointment helps, but it does not help that she has to wash her hands frequently.     Drug Allergies:  Allergies  Allergen Reactions   Bee Venom Anaphylaxis   Peanut-Containing Drug Products Anaphylaxis   Pollen Extract Shortness Of Breath    Tree and grass    Shellfish Allergy Anaphylaxis   Wasp Venom Anaphylaxis   Xolair [Omalizumab] Anaphylaxis   Amoxicillin Hives   Bug Itch Releaf [Misc Natural Products] Hives    Roaches, ants and dustmites   Contrave [Naltrexone-Bupropion Hcl Er] Hives   Corn-Containing Products     GI unset   Dust Mite Extract     Asthma trigger   Gluten Meal     GI upset, HIVeS   Iodine Hives   Lidocaine  Hives   Orange Fruit [Citrus] Hives   Penicillins Hives   Sesame Oil Diarrhea    GI upset   Soy Allergy Hives    GI upset   Tetracaine Hives   Tomato Hives    Review of Systems: Review of Systems  Constitutional:  Negative for chills and fever.  HENT:         Denies rhinorrhea, postnasal drip, and nasal congestion  Eyes:        Denies itchy watery eyes  Respiratory:  Positive for shortness of breath. Negative for cough and wheezing.        Reports shortness of breath with heat  Cardiovascular:  Negative for chest pain and palpitations.  Gastrointestinal:   Negative for heartburn.       Denies reflux  Genitourinary:  Negative for dysuria.  Skin:  Negative for itching and rash.  Neurological:  Negative for headaches.  Endo/Heme/Allergies:  Positive for environmental allergies.    Physical Exam: BP 118/78   Pulse 87   Temp 97.7 F (36.5 C)   Resp 16   Ht 5' 3.5" (1.613 m)   Wt 207 lb (93.9 kg)   SpO2 98%   BMI 36.09 kg/m    Physical Exam Constitutional:      Appearance: Normal appearance.  HENT:     Head: Normocephalic and atraumatic.     Comments: Pharynx normal, eyes normal, ears normal, nose normal    Right Ear: Tympanic membrane, ear canal and external ear normal.     Left Ear: Tympanic membrane, ear canal and external ear normal.     Nose: Nose normal.     Mouth/Throat:     Mouth: Mucous membranes are moist.     Pharynx: Oropharynx is clear.  Eyes:     Conjunctiva/sclera: Conjunctivae normal.  Cardiovascular:     Rate and Rhythm: Regular rhythm.     Heart sounds: Normal heart sounds.  Pulmonary:     Effort: Pulmonary effort is normal.     Breath sounds: Normal breath sounds.     Comments: Lungs clear to auscultation Musculoskeletal:     Cervical back: Neck supple.  Skin:    General: Skin is warm.     Comments: Dry eczematous lesions noted on palms of bilateral hands with minimal cracking.  No oozing,bleeding ,or erythema noted  Neurological:     Mental Status: She is alert and oriented to person, place, and time.  Psychiatric:        Mood and Affect: Mood normal.        Behavior: Behavior normal.        Thought Content: Thought content normal.        Judgment: Judgment normal.    Diagnostics: FVC 3.39 L, FEV1 2.85 L.  Predicted FVC 3.00 L, predicted FEV1 2.49 L.  Spirometry indicates normal ventilatory function.  Assessment and Plan: 1. Moderate persistent asthma, unspecified whether complicated   2. Perennial and seasonal allergic rhinitis   3. Food allergy   4. Other eczema   5. Allergic conjunctivitis  of both eyes     Meds ordered this encounter  Medications   budesonide-formoterol (SYMBICORT) 160-4.5 MCG/ACT inhaler    Sig: Inhale 2 puffs twice a day with spacer to help prevent cough and wheeze.    Dispense:  1 each    Refill:  2   triamcinolone ointment (KENALOG) 0.1 %    Sig: Apply 1 application twice a day as needed to red itchy areas. Do not  use on face, neck, groin, or armpit region    Dispense:  30 g    Refill:  2     Patient Instructions  Food allergy Avoid seeds, shellfish, fish, peanuts, tree nuts, soy and other foods that cause bothersome symptoms. In case of an allergic reaction, give Benadryl 4 teaspoonfuls every 4 hours, and if life-threatening symptoms occur, inject with AuviQ 0.3 mg.  Moderate persistent asthma Stop Flovent 220 mcg  Start Symbicort 160/4.5 mcg 2 puffs twice a day with spacer to help prevent cough and wheeze Continue montelukast 10 mg once a day to help prevent cough and wheeze Continue DuoNeb as needed May use albuterol 2 puffs every 4-6 hours as needed for cough, wheeze, tightness in chest, shortness of breath.  Also may use 2 puffs 5 to 15 minutes prior to exercise Asthma control goals:  Full participation in all desired activities (may need albuterol before activity) Albuterol use two time or less a week on average (not counting use with activity) Cough interfering with sleep two time or less a month Oral steroids no more than once a year No hospitalizations  Perennial and seasonal allergic rhinitis Continue azelastine 1-2 sprays each nostril one to two times a day as needed for runny nose/drainage down throat Continue immunotherapy per protocol and have access to epinephrine autoinjector device Continue Xhance 2 sprays each nostril twice a day as needed for stuffiness Continue levocetirizine (Xyzal) 5 mg once a day as needed for runny nose/itching  Allergic conjunctivitis Continue Pataday 1 drop each eye once a day as needed for itchy  watery eyes. Please call make sure it is okay with your ophthalmologist to use Pataday eyedrops as needed after your surgery May use Natural Tears, and eye lubricant as needed  Eczema Continue daily moisturizing program Continue Eucrisa 2% ointment using 1 application twice a day as needed to red itchy areas Continue triamcinolone 0.1% ointment using 1 application twice a day as needed to red itchy areas.  Do not use on face, neck, groin, or armpit region.  Please let us know if this treatment plan is not working well for you Schedule a follow-up appointment in 2 months or sooner if needed  Return in about 2 months (around 03/28/2021), or if symptoms worsen or fail to improve.    Thank you for the opportunity to care for this patient.  Please do not hesitate to contact me with questions.  Althea Charon, FNP [facility]

## 2021-02-06 ENCOUNTER — Ambulatory Visit (INDEPENDENT_AMBULATORY_CARE_PROVIDER_SITE_OTHER): Payer: BC Managed Care – PPO

## 2021-02-06 DIAGNOSIS — J309 Allergic rhinitis, unspecified: Secondary | ICD-10-CM

## 2021-02-09 ENCOUNTER — Other Ambulatory Visit: Payer: Self-pay | Admitting: *Deleted

## 2021-02-09 ENCOUNTER — Telehealth: Payer: Self-pay | Admitting: *Deleted

## 2021-02-09 MED ORDER — EUCRISA 2 % EX OINT
1.0000 | TOPICAL_OINTMENT | Freq: Two times a day (BID) | CUTANEOUS | 1 refills | Status: DC
Start: 2021-02-09 — End: 2022-10-20

## 2021-02-09 NOTE — Telephone Encounter (Signed)
PA has been submitted through CoverMyMeds for Eucrisa and is currently pending approval/denial.  

## 2021-02-10 NOTE — Telephone Encounter (Signed)
PA APPROVED AND pharmacy informed

## 2021-02-12 ENCOUNTER — Ambulatory Visit (INDEPENDENT_AMBULATORY_CARE_PROVIDER_SITE_OTHER): Payer: BC Managed Care – PPO | Admitting: *Deleted

## 2021-02-12 DIAGNOSIS — J309 Allergic rhinitis, unspecified: Secondary | ICD-10-CM | POA: Diagnosis not present

## 2021-02-20 ENCOUNTER — Ambulatory Visit (INDEPENDENT_AMBULATORY_CARE_PROVIDER_SITE_OTHER): Payer: BC Managed Care – PPO

## 2021-02-20 DIAGNOSIS — J309 Allergic rhinitis, unspecified: Secondary | ICD-10-CM

## 2021-02-24 ENCOUNTER — Encounter: Payer: Self-pay | Admitting: Internal Medicine

## 2021-03-06 ENCOUNTER — Ambulatory Visit (INDEPENDENT_AMBULATORY_CARE_PROVIDER_SITE_OTHER): Payer: BC Managed Care – PPO

## 2021-03-06 DIAGNOSIS — J309 Allergic rhinitis, unspecified: Secondary | ICD-10-CM | POA: Diagnosis not present

## 2021-03-13 ENCOUNTER — Ambulatory Visit (INDEPENDENT_AMBULATORY_CARE_PROVIDER_SITE_OTHER): Payer: BC Managed Care – PPO

## 2021-03-13 DIAGNOSIS — J309 Allergic rhinitis, unspecified: Secondary | ICD-10-CM

## 2021-03-15 NOTE — Patient Instructions (Signed)
Hives/rash: Takes pictures when it flares.  Start Xyzal '5mg'$  twice a day. If hives are not controlled or causes drowsiness let us know. Avoid the following potential triggers: alcohol, tight clothing, NSAIDs, hot showers and getting overheated. Get bloodwork:  We are ordering labs, so please allow 1-2 weeks for the results to come back. With the newly implemented Cures Act, the labs might be visible to you at the same time that they become visible to me. However, I will not address the results until all of the results are back, so please be patient.   Food allergy Continue to avoid seeds, shellfish, fish, peanuts, tree nuts, soy and other foods that cause bothersome symptoms.  For mild symptoms you can take over the counter antihistamines such as Benadryl and monitor symptoms closely. If symptoms worsen or if you have severe symptoms including breathing issues, throat closure, significant swelling, whole body hives, severe diarrhea and vomiting, lightheadedness then inject epinephrine and seek immediate medical care afterwards. Action plan in place.  Moderate persistent asthma Daily controller medication(s): continue Symbicort 144mg 2 puffs twice a day with spacer and rinse mouth afterwards. Continue Singulair (montelukast) '10mg'$  daily at night. May use albuterol rescue inhaler 2 puffs every 4 to 6 hours as needed for shortness of breath, chest tightness, coughing, and wheezing. May use albuterol rescue inhaler 2 puffs 5 to 15 minutes prior to strenuous physical activities. Monitor frequency of use.  Asthma control goals:  Full participation in all desired activities (may need albuterol before activity) Albuterol use two times or less a week on average (not counting use with activity) Cough interfering with sleep two times or less a month Oral steroids no more than once a year No hospitalizations  Allergic rhino conjunctivitis  Continue allergy injections. Use azelastine nasal spray 1-2  sprays per nostril twice a day as needed for runny nose/drainage. Use over the counter antihistamines such as Zyrtec (cetirizine), Claritin (loratadine), Allegra (fexofenadine), or Xyzal (levocetirizine) daily as needed. May take twice a day during allergy flares. May switch antihistamines every few months. Use Xhance 2 sprays each nostril twice a day as needed for nasal congestion.  Only use refresh eye drops for now. Clear with your eye doctor when you can start using pataday again.   Eczema Continue proper skin care. Use Eucrisa (crisaborole) 2% ointment twice a day on mild rash flares on the face and body. This is a non-steroid ointment.  If it burns, place the medication in the refrigerator.  Apply a thin layer of moisturizer and then apply the Eucrisa on top of it. Use triamcinolone 0.1% ointment twice a day as needed for rash flares. Do not use on the face, neck, armpits or groin area. Do not use more than 3 weeks in a row.   Follow up in 2 months or sooner if needed.  Skin care recommendations  Bath time: Always use lukewarm water. AVOID very hot or cold water. Keep bathing time to 5-10 minutes. Do NOT use bubble bath. Use a mild soap and use just enough to wash the dirty areas. Do NOT scrub skin vigorously.  After bathing, pat dry your skin with a towel. Do NOT rub or scrub the skin.  Moisturizers and prescriptions:  ALWAYS apply moisturizers immediately after bathing (within 3 minutes). This helps to lock-in moisture. Use the moisturizer several times a day over the whole body. Good summer moisturizers include: Aveeno, CeraVe, Cetaphil. Good winter moisturizers include: Aquaphor, Vaseline, Cerave, Cetaphil, Eucerin, Vanicream. When using moisturizers along  with medications, the moisturizer should be applied about one hour after applying the medication to prevent diluting effect of the medication or moisturize around where you applied the medications. When not using  medications, the moisturizer can be continued twice daily as maintenance.  Laundry and clothing: Avoid laundry products with added color or perfumes. Use unscented hypo-allergenic laundry products such as Tide free, Cheer free & gentle, and All free and clear.  If the skin still seems dry or sensitive, you can try double-rinsing the clothes. Avoid tight or scratchy clothing such as wool. Do not use fabric softeners or dyer sheets.

## 2021-03-16 ENCOUNTER — Ambulatory Visit: Payer: BC Managed Care – PPO | Admitting: Family

## 2021-03-16 ENCOUNTER — Other Ambulatory Visit: Payer: Self-pay

## 2021-03-16 ENCOUNTER — Encounter: Payer: Self-pay | Admitting: Allergy

## 2021-03-16 ENCOUNTER — Ambulatory Visit (INDEPENDENT_AMBULATORY_CARE_PROVIDER_SITE_OTHER): Payer: BC Managed Care – PPO | Admitting: Allergy

## 2021-03-16 ENCOUNTER — Ambulatory Visit: Payer: BC Managed Care – PPO | Admitting: Family Medicine

## 2021-03-16 VITALS — BP 120/78 | HR 70 | Temp 98.0°F | Resp 16 | Ht 63.5 in | Wt 214.2 lb

## 2021-03-16 DIAGNOSIS — L509 Urticaria, unspecified: Secondary | ICD-10-CM | POA: Diagnosis not present

## 2021-03-16 DIAGNOSIS — H1010 Acute atopic conjunctivitis, unspecified eye: Secondary | ICD-10-CM

## 2021-03-16 DIAGNOSIS — J4540 Moderate persistent asthma, uncomplicated: Secondary | ICD-10-CM

## 2021-03-16 DIAGNOSIS — J3089 Other allergic rhinitis: Secondary | ICD-10-CM

## 2021-03-16 DIAGNOSIS — J302 Other seasonal allergic rhinitis: Secondary | ICD-10-CM | POA: Diagnosis not present

## 2021-03-16 DIAGNOSIS — L2089 Other atopic dermatitis: Secondary | ICD-10-CM | POA: Diagnosis not present

## 2021-03-16 DIAGNOSIS — T781XXD Other adverse food reactions, not elsewhere classified, subsequent encounter: Secondary | ICD-10-CM | POA: Insufficient documentation

## 2021-03-16 DIAGNOSIS — T7819XD Other adverse food reactions, not elsewhere classified, subsequent encounter: Secondary | ICD-10-CM | POA: Insufficient documentation

## 2021-03-16 DIAGNOSIS — H1013 Acute atopic conjunctivitis, bilateral: Secondary | ICD-10-CM

## 2021-03-16 MED ORDER — LEVOCETIRIZINE DIHYDROCHLORIDE 5 MG PO TABS: 5.0000 mg | ORAL_TABLET | Freq: Two times a day (BID) | ORAL | 5 refills | Status: DC

## 2021-03-16 NOTE — Assessment & Plan Note (Signed)
Doing much better with Symbicort. Still pretreating with albuterol prior to exertion and going outdoors though.  ACT score 25.   Today's spirometry was normal. . Daily controller medication(s): continue Symbicort 161mg 2 puffs twice a day with spacer and rinse mouth afterwards. . Continue Singulair (montelukast) '10mg'$  daily at night. . May use albuterol rescue inhaler 2 puffs every 4 to 6 hours as needed for shortness of breath, chest tightness, coughing, and wheezing. May use albuterol rescue inhaler 2 puffs 5 to 15 minutes prior to strenuous physical activities. Monitor frequency of use.  . Get spirometry at next visit.

## 2021-03-16 NOTE — Assessment & Plan Note (Signed)
.   Continue proper skin care.  Use Eucrisa (crisaborole) 2% ointment twice a day on mild rash flares on the face and body. This is a non-steroid ointment.  . Use triamcinolone 0.1% ointment twice a day as needed for rash flares. Do not use on the face, neck, armpits or groin area. Do not use more than 3 weeks in a row.

## 2021-03-16 NOTE — Assessment & Plan Note (Addendum)
Breaking out in rash/hives daily for the last week and on/off since January 2022 with no known triggers.  Questionable hives on arms. Rash on leg is just a remnant of the insect bite.   Takes pictures when it flares.   Start Xyzal '5mg'$  twice a day.  If hives are not controlled or causes drowsiness let us know. . Avoid the following potential triggers: alcohol, tight clothing, NSAIDs, hot showers and getting overheated. . Get bloodwork to rule out other etiologies.

## 2021-03-16 NOTE — Progress Notes (Signed)
Follow Up Note  RE: Carla Buck MRN: [id number] DOB: [birth year] Date of Office Visit: 03/16/2021  Referring provider: Ladell Pier, MD Primary care provider: Ladell Pier, MD  Chief Complaint: Allergic Rhinitis  (No issue nose sprays has been helping ), Other (Has  been bitten by black ant and has a black circle on her left leg), Eczema (Returning on hands ), and Urticaria (Has been breaking out on arms with constant itching not sure the cause )  History of Present Illness: I had the pleasure of seeing Carla Buck for a follow up visit at the [facility] [facility] on 03/16/2021. She is a 40 y.o. female, who is being followed for food allergy, asthma, allergic rhino conjunctivitis on AIT. Her previous allergy office visit was on 01/26/2021 with Althea Charon FNP. Today is a regular follow up visit.  Food allergy Currently avoiding seeds, shellfish, fish, peanuts, tree nuts, soy.   Hives/rash Breaking out daily the last week - with no specific triggers - now breaking out daily. She did have covid-19 in October 2021. Started breakin out in January 2022.   Denies any fevers, chills, changes in medications, foods, personal care products. Patient concerned if she she's doing anything that's causing the rashes.  She does eat red meat on a consistent basis - no known tick bites.   Patient got bit by something on her leg and now having some discoloration.   Moderate persistent asthma Currently on Symbicort 152mg 2 puffs twice a day with good benefit. Using albuterol 2 puffs prior to going to outside and prior going to the gym.  Denies any ER/urgent care visits or prednisone use since the last visit.  Allergic rhino conjunctivitis Still having large localized reactions after injections.  Taking Xyzal at night. Currently using azelastine 2 spray BID and stopped using Xhance.  No eye drops use as she had some type of procedure on her eyes.   Assessment and  Plan: TMohoganyis a 40y.o. female with: Moderate persistent asthma Doing much better with Symbicort. Still pretreating with albuterol prior to exertion and going outdoors though. ACT score 25.  Today's spirometry was normal. Daily controller medication(s): continue Symbicort 1639m 2 puffs twice a day with spacer and rinse mouth afterwards. Continue Singulair (montelukast) '10mg'$  daily at night. May use albuterol rescue inhaler 2 puffs every 4 to 6 hours as needed for shortness of breath, chest tightness, coughing, and wheezing. May use albuterol rescue inhaler 2 puffs 5 to 15 minutes prior to strenuous physical activities. Monitor frequency of use.  Get spirometry at next visit.  Urticaria Breaking out in rash/hives daily for the last week and on/off since January 2022 with no known triggers. Questionable hives on arms. Rash on leg is just a remnant of the insect bite.  Takes pictures when it flares.  Start Xyzal '5mg'$  twice a day. If hives are not controlled or causes drowsiness let usKoreanow. Avoid the following potential triggers: alcohol, tight clothing, NSAIDs, hot showers and getting overheated. Get bloodwork to rule out other etiologies.   Other atopic dermatitis Continue proper skin care. Use Eucrisa (crisaborole) 2% ointment twice a day on mild rash flares on the face and body. This is a non-steroid ointment.  Use triamcinolone 0.1% ointment twice a day as needed for rash flares. Do not use on the face, neck, armpits or groin area. Do not use more than 3 weeks in a row.   Seasonal and perennial allergic rhinoconjunctivitis Past  history - started AIT on 08/29/2019 (G-WEEDS-T-DM, M-C-D-CR). Interim history - still having localized reactions after AIT.  Continue allergy injections. Use azelastine nasal spray 1-2 sprays per nostril twice a day as needed for runny nose/drainage. Use over the counter antihistamines such as Zyrtec (cetirizine), Claritin (loratadine), Allegra (fexofenadine), or  Xyzal (levocetirizine) daily as needed. May take twice a day during allergy flares. May switch antihistamines every few months. Use Xhance 2 sprays each nostril twice a day as needed for nasal congestion.  Only use refresh eye drops for now. Clear with your eye doctor when you can start using pataday again.   Other adverse food reactions, not elsewhere classified, subsequent encounter No reactions since the last visit. Continue to avoid seeds, shellfish, fish, peanuts, tree nuts, soy and other foods that cause bothersome symptoms.  For mild symptoms you can take over the counter antihistamines such as Benadryl and monitor symptoms closely. If symptoms worsen or if you have severe symptoms including breathing issues, throat closure, significant swelling, whole body hives, severe diarrhea and vomiting, lightheadedness then inject epinephrine and seek immediate medical care afterwards. Action plan in place.  Return in about 2 months (around 05/16/2021).  Meds ordered this encounter  Medications   levocetirizine (XYZAL) 5 MG tablet    Sig: Take 1 tablet (5 mg total) by mouth 2 (two) times daily.    Dispense:  60 tablet    Refill:  5    Lab Orders  Alpha-Gal Panel  ANA w/Reflex  CBC with Differential/Platelet  Chronic Urticaria  Comprehensive metabolic panel  C-reactive protein  Tryptase  Sedimentation rate  Thyroid Cascade Profile  C3 and C4    Diagnostics: Spirometry:  Tracings reviewed. Her effort: Good reproducible efforts. FVC: 2.99L FEV1: 2.65L, 106% predicted FEV1/FVC ratio: 89% Interpretation: Spirometry consistent with normal pattern.  Please see scanned spirometry results for details.  Medication List:  Current Outpatient Medications  Medication Sig Dispense Refill   acyclovir (ZOVIRAX) 400 MG tablet TAKE 1 TABLET BY MOUTH TWICE A DAY 180 tablet 0   albuterol (VENTOLIN HFA) 108 (90 Base) MCG/ACT inhaler Inhale 2 puffs every 4-6 hours as needed for cough, wheeze,  tightness in chest, shortness of breath 18 g 1   ALPRAZolam (XANAX) 0.25 MG tablet Take 1-2 tabs (0.'25mg'$ -0.'50mg'$ ) 30-60 minutes before procedure. May repeat if needed.Do not drive. 4 tablet 0   Azelastine HCl 0.15 % SOLN Spray 1 to 2 sprays each nostril 1-2 times a day as needed for runny nose/drainage down throat 30 mL 3   budesonide-formoterol (SYMBICORT) 160-4.5 MCG/ACT inhaler Inhale 2 puffs twice a day with spacer to help prevent cough and wheeze. 1 each 2   calcium carbonate (OSCAL) 1500 (600 Ca) MG TABS tablet Take 1,500 mg by mouth daily with breakfast.     diphenhydrAMINE (BENADRYL) 25 mg capsule Take 25 mg by mouth every 6 (six) hours as needed.     EPINEPHrine (AUVI-Q) 0.3 mg/0.3 mL IJ SOAJ injection Inject 0.3 mg into the muscle as needed for anaphylaxis. 1 each 1   ergocalciferol (VITAMIN D2) 1.25 MG (50000 UT) capsule Take 50,000 Units by mouth once a week.     erythromycin ophthalmic ointment Place a 1/2 inch ribbon of ointment into the lower eyelid, four times daily for 7 days 3.5 g 0   EUCRISA 2 % OINT Apply 1 application topically 2 (two) times daily. 100 g 1   ferrous sulfate 325 (65 FE) MG tablet Take 325 mg by mouth daily with breakfast.  fexofenadine-pseudoephedrine (ALLEGRA-D 24) 180-240 MG 24 hr tablet Take 1 tablet by mouth daily. Rotates each month between Allegra and Xyzal.     Fluticasone Propionate (XHANCE) 93 MCG/ACT EXHU Place 2 sprays into the nose 2 (two) times daily. 16 mL 5   Fremanezumab-vfrm (AJOVY) 225 MG/1.5ML SOAJ INJECT 1 PEN INTO THE SKIN EVERY 30 DAYS. APPOINTMENT NEEDED FOR FURTHER REFILLS. CALL [phone]. 1.5 mL 4   ibuprofen (ADVIL) 600 MG tablet Take 1 tablet (600 mg total) by mouth every 6 (six) hours as needed for up to 30 doses for mild pain or moderate pain. 30 tablet 0   ipratropium-albuterol (DUONEB) 0.5-2.5 (3) MG/3ML SOLN Take 3 mLs by nebulization every 6 (six) hours as needed.     levocetirizine (XYZAL) 5 MG tablet Take 1 tablet (5 mg  total) by mouth 2 (two) times daily. 60 tablet 5   montelukast (SINGULAIR) 10 MG tablet Take 1 tablet (10 mg total) by mouth at bedtime. 30 tablet 5   Multiple Vitamins-Minerals (BARIATRIC MULTIVITAMINS/IRON PO) Take 1 tablet by mouth.     NURTEC 75 MG TBDP TAKE 1 TABLET BY MOUTH DAILY AS NEEDED TAKE AS CLOSE TO ONSET OF MIGRAINE AS POSSIBLE 8 tablet 8   ondansetron (ZOFRAN-ODT) 4 MG disintegrating tablet Take 1-2 tablets (4-8 mg total) by mouth every 8 (eight) hours as needed for nausea. Appointment needed for further refills. 30 tablet 2   propranolol (INDERAL) 10 MG tablet Take 10 mg by mouth as needed.     rizatriptan (MAXALT-MLT) 10 MG disintegrating tablet TAKE 1 TABLET BY MOUTH AS NEEDED FOR MIGRAINE. MAY REPEAT IN 2 HOURS IF NEEDED 9 tablet 11   tamoxifen (NOLVADEX) 20 MG tablet Take 50 mg by mouth daily.      traZODone (DESYREL) 150 MG tablet Take 150 mg by mouth at bedtime as needed for sleep.     triamcinolone ointment (KENALOG) 0.1 % Apply 1 application topically 2 (two) times daily. 454 g 5   venlafaxine (EFFEXOR) 100 MG tablet Take 100 mg by mouth 2 (two) times daily.     verapamil (CALAN) 80 MG tablet TAKE 1 TABLET (80 MG TOTAL) BY MOUTH 3 (THREE) TIMES DAILY. 270 tablet 3   No current facility-administered medications for this visit.   Allergies: Allergies  Allergen Reactions   Bee Venom Anaphylaxis   Peanut-Containing Drug Products Anaphylaxis   Pollen Extract Shortness Of Breath    Tree and grass    Shellfish Allergy Anaphylaxis   Wasp Venom Anaphylaxis   Xolair [Omalizumab] Anaphylaxis   Amoxicillin Hives   Bug Itch Releaf [Misc Natural Products] Hives    Roaches, ants and dustmites   Contrave [Naltrexone-Bupropion Hcl Er] Hives   Corn-Containing Products     GI unset   Dust Mite Extract     Asthma trigger   Gluten Meal     GI upset, HIVeS   Iodine Hives   Lidocaine Hives   Orange Fruit [Citrus] Hives   Penicillins Hives   Sesame Oil Diarrhea    GI upset    Soy Allergy Hives    GI upset   Tetracaine Hives   Tomato Hives   I reviewed her past medical history, social history, family history, and environmental history and no significant changes have been reported from her previous visit.  Review of Systems  Constitutional:  Negative for appetite change, chills, fever and unexpected weight change.  HENT:  Negative for congestion and rhinorrhea.   Eyes:  Negative for itching.  Respiratory:  Negative for cough, chest tightness, shortness of breath and wheezing.   Gastrointestinal:  Negative for abdominal pain.  Skin:  Positive for rash.  Allergic/Immunologic: Positive for environmental allergies and food allergies.  Neurological:  Negative for headaches.   Objective: BP 120/78   Pulse 70   Temp 98 F (36.7 C)   Resp 16   Ht 5' 3.5" (1.613 m)   Wt 214 lb 3.2 oz (97.2 kg)   SpO2 98%   BMI 37.34 kg/m  Body mass index is 37.34 kg/m. Physical Exam Vitals and nursing note reviewed.  Constitutional:      Appearance: Normal appearance. She is well-developed.  HENT:     Head: Normocephalic and atraumatic.     Right Ear: Tympanic membrane and external ear normal.     Left Ear: Tympanic membrane and external ear normal.     Nose: Nose normal.     Mouth/Throat:     Mouth: Mucous membranes are moist.     Pharynx: Oropharynx is clear.  Eyes:     Conjunctiva/sclera: Conjunctivae normal.  Cardiovascular:     Rate and Rhythm: Normal rate and regular rhythm.     Heart sounds: Normal heart sounds. No murmur heard. Pulmonary:     Effort: Pulmonary effort is normal.     Breath sounds: Normal breath sounds. No wheezing, rhonchi or rales.  Musculoskeletal:     Cervical back: Neck supple.  Skin:    General: Skin is warm.     Findings: Rash present.     Comments: Raised papular rash on upper extremities b/l - scattered. Hyperpigmented hue on left anterior shin area.   Neurological:     Mental Status: She is alert and oriented to person, place,  and time.  Psychiatric:        Behavior: Behavior normal.   Previous notes and tests were reviewed. The plan was reviewed with the patient/family, and all questions/concerned were addressed.  It was my pleasure to see Allex today and participate in her care. Please feel free to contact me with any questions or concerns.  Sincerely,  Rexene Alberts, DO Allergy & Immunology  [facility] office: [place] office: [phone]

## 2021-03-16 NOTE — Assessment & Plan Note (Signed)
No reactions since the last visit. . Continue to avoid seeds, shellfish, fish, peanuts, tree nuts, soy and other foods that cause bothersome symptoms.  . For mild symptoms you can take over the counter antihistamines such as Benadryl and monitor symptoms closely. If symptoms worsen or if you have severe symptoms including breathing issues, throat closure, significant swelling, whole body hives, severe diarrhea and vomiting, lightheadedness then inject epinephrine and seek immediate medical care afterwards. . Action plan in place.

## 2021-03-16 NOTE — Assessment & Plan Note (Signed)
Past history - started AIT on 08/29/2019 (G-WEEDS-T-DM, M-C-D-CR). Interim history - still having localized reactions after AIT.  Marland Kitchen Continue allergy injections. . Use azelastine nasal spray 1-2 sprays per nostril twice a day as needed for runny nose/drainage. . Use over the counter antihistamines such as Zyrtec (cetirizine), Claritin (loratadine), Allegra (fexofenadine), or Xyzal (levocetirizine) daily as needed. May take twice a day during allergy flares. May switch antihistamines every few months. . Use Xhance 2 sprays each nostril twice a day as needed for nasal congestion.  . Only use refresh eye drops for now. o Clear with your eye doctor when you can start using pataday again.

## 2021-03-17 ENCOUNTER — Encounter: Payer: Self-pay | Admitting: Allergy

## 2021-03-20 ENCOUNTER — Ambulatory Visit (INDEPENDENT_AMBULATORY_CARE_PROVIDER_SITE_OTHER): Payer: BC Managed Care – PPO

## 2021-03-20 DIAGNOSIS — J309 Allergic rhinitis, unspecified: Secondary | ICD-10-CM | POA: Diagnosis not present

## 2021-03-21 ENCOUNTER — Encounter: Payer: Self-pay | Admitting: Allergy

## 2021-03-27 ENCOUNTER — Encounter: Payer: Self-pay | Admitting: Allergy

## 2021-03-27 ENCOUNTER — Other Ambulatory Visit: Payer: Self-pay | Admitting: Family

## 2021-03-27 LAB — CBC WITH DIFFERENTIAL/PLATELET
Basophils Absolute: 0 10*3/uL (ref 0.0–0.2)
Basos: 1 %
EOS (ABSOLUTE): 0.1 10*3/uL (ref 0.0–0.4)
Eos: 2 %
Hematocrit: 41.6 % (ref 34.0–46.6)
Hemoglobin: 13.5 g/dL (ref 11.1–15.9)
Immature Grans (Abs): 0 10*3/uL (ref 0.0–0.1)
Immature Granulocytes: 0 %
Lymphocytes Absolute: 2.4 10*3/uL (ref 0.7–3.1)
Lymphs: 52 %
MCH: 31.1 pg (ref 26.6–33.0)
MCHC: 32.5 g/dL (ref 31.5–35.7)
MCV: 96 fL (ref 79–97)
Monocytes Absolute: 0.3 10*3/uL (ref 0.1–0.9)
Monocytes: 5 %
Neutrophils Absolute: 1.8 10*3/uL (ref 1.4–7.0)
Neutrophils: 40 %
Platelets: 250 10*3/uL (ref 150–450)
RBC: 4.34 x10E6/uL (ref 3.77–5.28)
RDW: 12.2 % (ref 11.7–15.4)
WBC: 4.6 10*3/uL (ref 3.4–10.8)

## 2021-03-27 LAB — COMPREHENSIVE METABOLIC PANEL
ALT: 17 IU/L (ref 0–32)
AST: 25 IU/L (ref 0–40)
Albumin/Globulin Ratio: 1.7 (ref 1.2–2.2)
Albumin: 4.3 g/dL (ref 3.8–4.8)
Alkaline Phosphatase: 65 IU/L (ref 44–121)
BUN/Creatinine Ratio: 17 (ref 9–23)
BUN: 12 mg/dL (ref 6–20)
Bilirubin Total: 0.2 mg/dL (ref 0.0–1.2)
CO2: 24 mmol/L (ref 20–29)
Calcium: 9.1 mg/dL (ref 8.7–10.2)
Chloride: 101 mmol/L (ref 96–106)
Creatinine, Ser: 0.71 mg/dL (ref 0.57–1.00)
Globulin, Total: 2.5 g/dL (ref 1.5–4.5)
Glucose: 79 mg/dL (ref 65–99)
Potassium: 4.2 mmol/L (ref 3.5–5.2)
Sodium: 139 mmol/L (ref 134–144)
Total Protein: 6.8 g/dL (ref 6.0–8.5)
eGFR: 112 mL/min/{1.73_m2} (ref >59)

## 2021-03-27 LAB — ANA W/REFLEX: Anti Nuclear Antibody (ANA): NEGATIVE

## 2021-03-27 LAB — ALPHA-GAL PANEL
Allergen Lamb IgE: <0.1 kU/L
Beef IgE: <0.1 kU/L
IgE (Immunoglobulin E), Serum: 34 IU/mL (ref 6–495)
O215-IgE Alpha-Gal: <0.1 kU/L
Pork IgE: <0.1 kU/L

## 2021-03-27 LAB — C3 AND C4
Complement C3, Serum: 107 mg/dL (ref 82–167)
Complement C4, Serum: 41 mg/dL — ABNORMAL HIGH (ref 12–38)

## 2021-03-27 LAB — CHRONIC URTICARIA: cu index: <1 (ref <10)

## 2021-03-27 LAB — THYROID CASCADE PROFILE: TSH: 2.54 u[IU]/mL (ref 0.450–4.500)

## 2021-03-27 LAB — SEDIMENTATION RATE: Sed Rate: 3 mm/hr (ref 0–32)

## 2021-03-27 LAB — TRYPTASE: Tryptase: 4.8 ug/L (ref 2.2–13.2)

## 2021-03-27 LAB — C-REACTIVE PROTEIN: CRP: 1 mg/L (ref 0–10)

## 2021-03-30 ENCOUNTER — Ambulatory Visit: Payer: BC Managed Care – PPO | Admitting: Family

## 2021-04-02 ENCOUNTER — Ambulatory Visit (INDEPENDENT_AMBULATORY_CARE_PROVIDER_SITE_OTHER): Payer: BC Managed Care – PPO | Admitting: *Deleted

## 2021-04-02 DIAGNOSIS — J309 Allergic rhinitis, unspecified: Secondary | ICD-10-CM

## 2021-04-03 ENCOUNTER — Other Ambulatory Visit: Payer: Self-pay

## 2021-04-03 ENCOUNTER — Ambulatory Visit: Payer: BC Managed Care – PPO | Attending: Internal Medicine | Admitting: Internal Medicine

## 2021-04-03 VITALS — BP 110/82 | HR 74 | Resp 16 | Ht 63.0 in | Wt 212.6 lb

## 2021-04-03 DIAGNOSIS — F32A Depression, unspecified: Secondary | ICD-10-CM

## 2021-04-03 DIAGNOSIS — E669 Obesity, unspecified: Secondary | ICD-10-CM

## 2021-04-03 DIAGNOSIS — Z0000 Encounter for general adult medical examination without abnormal findings: Secondary | ICD-10-CM

## 2021-04-03 DIAGNOSIS — F419 Anxiety disorder, unspecified: Secondary | ICD-10-CM | POA: Diagnosis not present

## 2021-04-03 DIAGNOSIS — Z23 Encounter for immunization: Secondary | ICD-10-CM

## 2021-04-03 DIAGNOSIS — Z1159 Encounter for screening for other viral diseases: Secondary | ICD-10-CM

## 2021-04-03 DIAGNOSIS — J4540 Moderate persistent asthma, uncomplicated: Secondary | ICD-10-CM | POA: Diagnosis not present
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
## 2021-04-03 NOTE — Patient Instructions (Signed)
Preventive Care 21-39 Years Old, Female Preventive care refers to lifestyle choices and visits with your health care provider that can promote health and wellness. This includes: A yearly physical exam. This is also called an annual wellness visit. Regular dental and eye exams. Immunizations. Screening for certain conditions. Healthy lifestyle choices, such as: Eating a healthy diet. Getting regular exercise. Not using drugs or products that contain nicotine and tobacco. Limiting alcohol use. What can I expect for my preventive care visit? Physical exam Your health care provider may check your: Height and weight. These may be used to calculate your BMI (body mass index). BMI is a measurement that tells if you are at a healthy weight. Heart rate and blood pressure. Body temperature. Skin for abnormal spots. Counseling Your health care provider may ask you questions about your: Past medical problems. Family's medical history. Alcohol, tobacco, and drug use. Emotional well-being. Home life and relationship well-being. Sexual activity. Diet, exercise, and sleep habits. Work and work environment. Access to firearms. Method of birth control. Menstrual cycle. Pregnancy history. What immunizations do I need?  Vaccines are usually given at various ages, according to a schedule. Your health care provider will recommend vaccines for you based on your age, medicalhistory, and lifestyle or other factors, such as travel or where you work. What tests do I need?  Blood tests Lipid and cholesterol levels. These may be checked every 5 years starting at age 20. Hepatitis C test. Hepatitis B test. Screening Diabetes screening. This is done by checking your blood sugar (glucose) after you have not eaten for a while (fasting). STD (sexually transmitted disease) testing, if you are at risk. BRCA-related cancer screening. This may be done if you have a family history of breast, ovarian, tubal, or  peritoneal cancers. Pelvic exam and Pap test. This may be done every 3 years starting at age 21. Starting at age 30, this may be done every 5 years if you have a Pap test in combination with an HPV test. Talk with your health care provider about your test results, treatment options,and if necessary, the need for more tests. Follow these instructions at home: Eating and drinking  Eat a healthy diet that includes fresh fruits and vegetables, whole grains, lean protein, and low-fat dairy products. Take vitamin and mineral supplements as recommended by your health care provider. Do not drink alcohol if: Your health care provider tells you not to drink. You are pregnant, may be pregnant, or are planning to become pregnant. If you drink alcohol: Limit how much you have to 0-1 drink a day. Be aware of how much alcohol is in your drink. In the [place] one drink equals one 12 oz bottle of beer (355 mL), one 5 oz glass of wine (148 mL), or one 1 oz glass of hard liquor (44 mL).  Lifestyle Take daily care of your teeth and gums. Brush your teeth every morning and night with fluoride toothpaste. Floss one time each day. Stay active. Exercise for at least 30 minutes 5 or more days each week. Do not use any products that contain nicotine or tobacco, such as cigarettes, e-cigarettes, and chewing tobacco. If you need help quitting, ask your health care provider. Do not use drugs. If you are sexually active, practice safe sex. Use a condom or other form of protection to prevent STIs (sexually transmitted infections). If you do not wish to become pregnant, use a form of birth control. If you plan to become pregnant, see your health care   provider for a prepregnancy visit. Find healthy ways to cope with stress, such as: Meditation, yoga, or listening to music. Journaling. Talking to a trusted person. Spending time with friends and family. Safety Always wear your seat belt while driving or riding in a  vehicle. Do not drive: If you have been drinking alcohol. Do not ride with someone who has been drinking. When you are tired or distracted. While texting. Wear a helmet and other protective equipment during sports activities. If you have firearms in your house, make sure you follow all gun safety procedures. Seek help if you have been physically or sexually abused. What's next? Go to your health care provider once a year for an annual wellness visit. Ask your health care provider how often you should have your eyes and teeth checked. Stay up to date on all vaccines. This information is not intended to replace advice given to you by your health care provider. Make sure you discuss any questions you have with your healthcare provider. Document Revised: 03/30/2020 Document Reviewed: 04/13/2018 Elsevier Patient Education  2022 [place] trying to move as much as you can.   Healthy Eating Following a healthy eating pattern may help you to achieve and maintain a healthy body weight, reduce the risk of chronic disease, and live a long and productive life. It is important to follow a healthy eating pattern at an appropriate calorie level for your body. Your nutritional needs should be metprimarily through food by choosing a variety of nutrient-rich foods. What are tips for following this plan? Reading food labels Read labels and choose the following: Reduced or low sodium. Juices with 100% fruit juice. Foods with low saturated fats and high polyunsaturated and monounsaturated fats. Foods with whole grains, such as whole wheat, cracked wheat, brown rice, and wild rice. Whole grains that are fortified with folic acid. This is recommended for women who are pregnant or who want to become pregnant. Read labels and avoid the following: Foods with a lot of added sugars. These include foods that contain brown sugar, corn sweetener, corn syrup, dextrose, fructose, glucose, high-fructose  corn syrup, honey, invert sugar, lactose, malt syrup, maltose, molasses, raw sugar, sucrose, trehalose, or turbinado sugar. Do not eat more than the following amounts of added sugar per day: 6 teaspoons (25 g) for women. 9 teaspoons (38 g) for men. Foods that contain processed or refined starches and grains. Refined grain products, such as white flour, degermed cornmeal, white bread, and white rice. Shopping Choose nutrient-rich snacks, such as vegetables, whole fruits, and nuts. Avoid high-calorie and high-sugar snacks, such as potato chips, fruit snacks, and candy. Use oil-based dressings and spreads on foods instead of solid fats such as butter, stick margarine, or cream cheese. Limit pre-made sauces, mixes, and "instant" products such as flavored rice, instant noodles, and ready-made pasta. Try more plant-protein sources, such as tofu, tempeh, black beans, edamame, lentils, nuts, and seeds. Explore eating plans such as the Mediterranean diet or vegetarian diet. Cooking Use oil to saut or stir-fry foods instead of solid fats such as butter, stick margarine, or lard. Try baking, boiling, grilling, or broiling instead of frying. Remove the fatty part of meats before cooking. Steam vegetables in water or broth. Meal planning  At meals, imagine dividing your plate into fourths: One-half of your plate is fruits and vegetables. One-fourth of your plate is whole grains. One-fourth of your plate is protein, especially lean meats, poultry, eggs, tofu, beans, or nuts. Include low-fat dairy  as part of your daily diet.  Lifestyle Choose healthy options in all settings, including home, work, school, restaurants, or stores. Prepare your food safely: Wash your hands after handling raw meats. Keep food preparation surfaces clean by regularly washing with hot, soapy water. Keep raw meats separate from ready-to-eat foods, such as fruits and vegetables. Cook seafood, meat, poultry, and eggs to the  recommended internal temperature. Store foods at safe temperatures. In general: Keep cold foods at 57F (4.4C) or below. Keep hot foods at 157F (60C) or above. Keep your freezer at [facility] (-17.8C) or below. Foods are no longer safe to eat when they have been between the temperatures of 40-157F (4.4-60C) for more than 2 hours. What foods should I eat? Fruits Aim to eat 2 cup-equivalents of fresh, canned (in natural juice), or frozen fruits each day. Examples of 1 cup-equivalent of fruit include 1 small apple, 8large strawberries, 1 cup canned fruit,  cup dried fruit, or 1 cup 100% juice. Vegetables Aim to eat 2-3 cup-equivalents of fresh and frozen vegetables each day, including different varieties and colors. Examples of 1 cup-equivalent of vegetables include 2 medium carrots, 2 cups raw, leafy greens, 1 cup choppedvegetable (raw or cooked), or 1 medium baked potato. Grains Aim to eat 6 ounce-equivalents of whole grains each day. Examples of 1 ounce-equivalent of grains include 1 slice of bread, 1 cup ready-to-eat cereal,3 cups popcorn, or  cup cooked rice, pasta, or cereal. Meats and other proteins Aim to eat 5-6 ounce-equivalents of protein each day. Examples of 1 ounce-equivalent of protein include 1 egg, 1/2 cup nuts or seeds, or 1 tablespoon (16 g) peanut butter. A cut of meat or fish that is the size of a deck of cards is about 3-4 ounce-equivalents. Of the protein you eat each week, try to have at least 8 ounces come from seafood. This includes salmon, trout, herring, and anchovies. Dairy Aim to eat 3 cup-equivalents of fat-free or low-fat dairy each day. Examples of 1 cup-equivalent of dairy include 1 cup (240 mL) milk, 8 ounces (250 g) yogurt,1 ounces (44 g) natural cheese, or 1 cup (240 mL) fortified soy milk. Fats and oils Aim for about 5 teaspoons (21 g) per day. Choose monounsaturated fats, such as canola and olive oils, avocados, peanut butter, and most nuts, or  polyunsaturated fats, such as sunflower, corn, and soybean oils, walnuts, pine nuts, sesame seeds, sunflower seeds, and flaxseed. Beverages Aim for six 8-oz glasses of water per day. Limit coffee to three to five 8-oz cups per day. Limit caffeinated beverages that have added calories, such as soda and energy drinks. Limit alcohol intake to no more than 1 drink a day for nonpregnant women and 2 drinks a day for men. One drink equals 12 oz of beer (355 mL), 5 oz of wine (148 mL), or 1 oz of hard liquor (44 mL). Seasoning and other foods Avoid adding excess amounts of salt to your foods. Try flavoring foods with herbs and spices instead of salt. Avoid adding sugar to foods. Try using oil-based dressings, sauces, and spreads instead of solid fats. This information is based on general [place] nutrition guidelines. For more information, visit [employer]. Exact amounts may vary based on your nutrition needs. Summary A healthy eating plan may help you to maintain a healthy weight, reduce the risk of chronic diseases, and stay active throughout your life. Plan your meals. Make sure you eat the right portions of a variety of nutrient-rich foods. Try baking, boiling, grilling, or  broiling instead of frying. Choose healthy options in all settings, including home, work, school, restaurants, or stores. This information is not intended to replace advice given to you by your health care provider. Make sure you discuss any questions you have with your healthcare provider. Document Revised: 11/14/2017 Document Reviewed: 11/14/2017 Elsevier Patient Education  [place].

## 2021-04-04 LAB — HEPATITIS C ANTIBODY: Hep C Virus Ab: <0.1 s/co ratio (ref 0.0–0.9)

## 2021-04-04 LAB — LIPID PANEL
Chol/HDL Ratio: 2 ratio (ref 0.0–4.4)
Cholesterol, Total: 156 mg/dL (ref 100–199)
HDL: 80 mg/dL (ref >39)
LDL Chol Calc (NIH): 66 mg/dL (ref 0–99)
Triglycerides: 44 mg/dL (ref 0–149)
VLDL Cholesterol Cal: 10 mg/dL (ref 5–40)

## 2021-04-05 ENCOUNTER — Encounter: Payer: Self-pay | Admitting: Allergy

## 2021-04-06 ENCOUNTER — Other Ambulatory Visit: Payer: Self-pay | Admitting: *Deleted

## 2021-04-06 MED ORDER — MONTELUKAST SODIUM 10 MG PO TABS: 10.0000 mg | ORAL_TABLET | Freq: Every day | ORAL | 5 refills | Status: DC

## 2021-04-07 ENCOUNTER — Other Ambulatory Visit: Payer: Self-pay | Admitting: Internal Medicine

## 2021-04-09 ENCOUNTER — Ambulatory Visit (INDEPENDENT_AMBULATORY_CARE_PROVIDER_SITE_OTHER): Payer: BC Managed Care – PPO | Admitting: *Deleted

## 2021-04-09 DIAGNOSIS — J309 Allergic rhinitis, unspecified: Secondary | ICD-10-CM | POA: Diagnosis not present

## 2021-04-22 ENCOUNTER — Other Ambulatory Visit: Payer: Self-pay | Admitting: Family

## 2021-04-24 ENCOUNTER — Ambulatory Visit (INDEPENDENT_AMBULATORY_CARE_PROVIDER_SITE_OTHER): Payer: BC Managed Care – PPO

## 2021-04-24 DIAGNOSIS — J309 Allergic rhinitis, unspecified: Secondary | ICD-10-CM | POA: Diagnosis not present

## 2021-05-01 ENCOUNTER — Ambulatory Visit (INDEPENDENT_AMBULATORY_CARE_PROVIDER_SITE_OTHER): Payer: BC Managed Care – PPO

## 2021-05-01 DIAGNOSIS — J309 Allergic rhinitis, unspecified: Secondary | ICD-10-CM | POA: Diagnosis not present

## 2021-05-04 NOTE — Progress Notes (Signed)
VIALS MADE. EXP 05-04-22

## 2021-05-05 DIAGNOSIS — J3089 Other allergic rhinitis: Secondary | ICD-10-CM | POA: Diagnosis not present

## 2021-05-06 DIAGNOSIS — J3081 Allergic rhinitis due to animal (cat) (dog) hair and dander: Secondary | ICD-10-CM | POA: Diagnosis not present

## 2021-05-08 ENCOUNTER — Ambulatory Visit (INDEPENDENT_AMBULATORY_CARE_PROVIDER_SITE_OTHER): Payer: BC Managed Care – PPO

## 2021-05-08 DIAGNOSIS — J309 Allergic rhinitis, unspecified: Secondary | ICD-10-CM | POA: Diagnosis not present

## 2021-05-22 ENCOUNTER — Ambulatory Visit (INDEPENDENT_AMBULATORY_CARE_PROVIDER_SITE_OTHER): Payer: BC Managed Care – PPO

## 2021-05-22 DIAGNOSIS — J309 Allergic rhinitis, unspecified: Secondary | ICD-10-CM | POA: Diagnosis not present

## 2021-05-23 ENCOUNTER — Other Ambulatory Visit: Payer: Self-pay | Admitting: Family

## 2021-05-24 NOTE — Telephone Encounter (Signed)
Ok to refill albuterol 2 puffs every 4-6 hours as needed for cough, wheeze, tightness in chest, or shortness of breath.

## 2021-06-04 ENCOUNTER — Ambulatory Visit (INDEPENDENT_AMBULATORY_CARE_PROVIDER_SITE_OTHER): Payer: BC Managed Care – PPO | Admitting: *Deleted

## 2021-06-04 DIAGNOSIS — J309 Allergic rhinitis, unspecified: Secondary | ICD-10-CM

## 2021-06-25 ENCOUNTER — Other Ambulatory Visit: Payer: Self-pay | Admitting: Neurology

## 2021-07-03 ENCOUNTER — Ambulatory Visit (INDEPENDENT_AMBULATORY_CARE_PROVIDER_SITE_OTHER): Payer: BC Managed Care – PPO

## 2021-07-03 DIAGNOSIS — J309 Allergic rhinitis, unspecified: Secondary | ICD-10-CM | POA: Diagnosis not present

## 2021-07-17 ENCOUNTER — Other Ambulatory Visit: Payer: Self-pay | Admitting: Neurology

## 2021-07-22 ENCOUNTER — Telehealth: Payer: Self-pay

## 2021-07-22 NOTE — Telephone Encounter (Signed)
Noted  

## 2021-07-22 NOTE — Telephone Encounter (Signed)
[facility] called the lab test C3 and C4 Was not covered by listed diagnosis code. I gave them the diagnosis code information attached to the visit. She also wrote in the reason for the code to help get it covered. The office visit was on 03/16/21.

## 2021-07-28 ENCOUNTER — Other Ambulatory Visit: Payer: Self-pay | Admitting: Neurology

## 2021-07-28 DIAGNOSIS — G43709 Chronic migraine without aura, not intractable, without status migrainosus: Secondary | ICD-10-CM

## 2021-08-15 IMAGING — MR MR KNEE*R* W/O CM
4 of 6 series · 23 of 40 positions shown · non-contrast
Comparison: Plain films right knee 06/21/2019.

CLINICAL DATA: Anterior right knee pain for 5 weeks since an injury
while walking.

EXAM:
MRI OF THE RIGHT KNEE WITHOUT CONTRAST
TECHNIQUE: Multiplanar, multisequence MR imaging of the knee was performed. No
intravenous contrast was administered.

[Series 3: T2 fat-sat · axial · 4.0mm · 0.50mm/px · z∈[-82,+38]mm · 6 of 30 slices shown (1 of 2)]
[im 1/30]
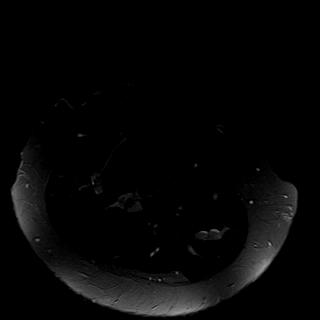
[im 5/30]
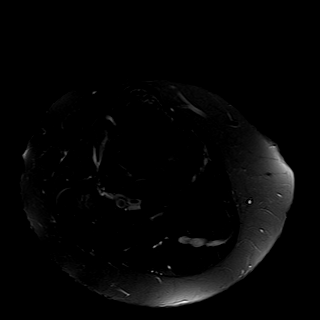
[im 10/30]
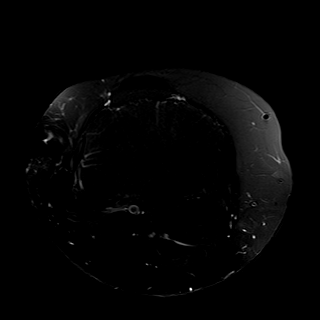
[im 15/30]
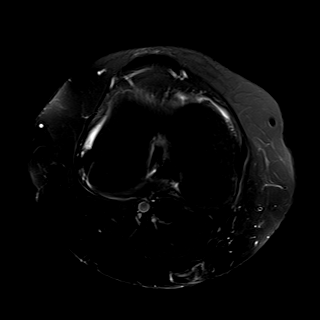
[im 20/30]
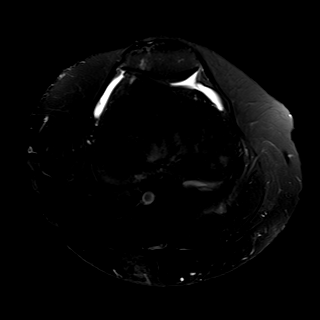
[im 25/30]
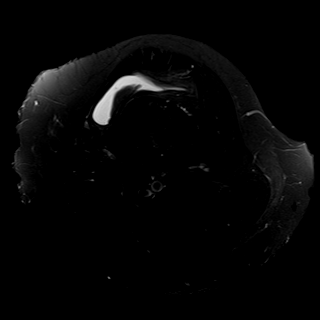

[Series 5: T2 fat-sat · coronal · 4.0mm · 0.29mm/px · 3 of 27 slices shown (2 of 2)]
[im 6/27]
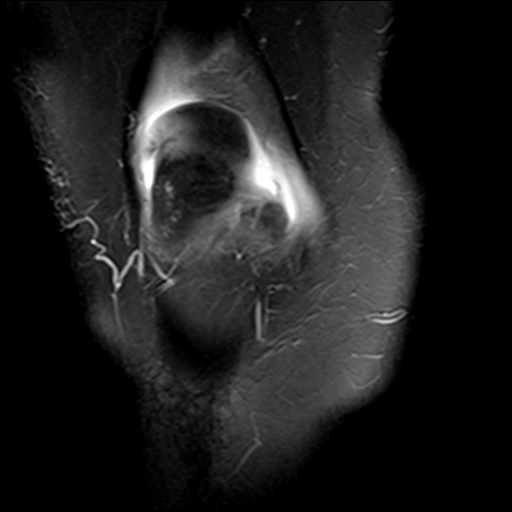
[im 16/27]
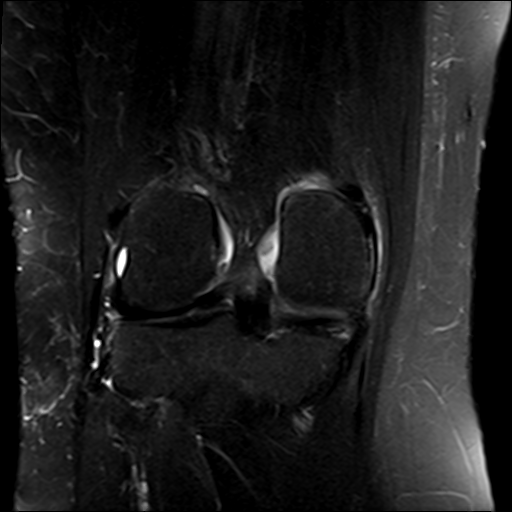
[im 27/27]
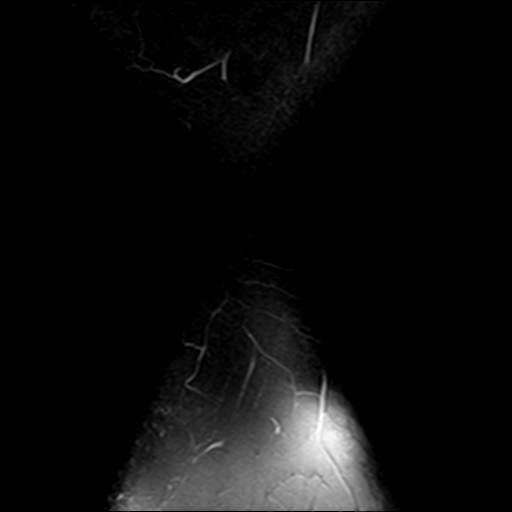

[Series 6: PD fat-sat · coronal · 3.0mm · 0.29mm/px · 7 of 31 slices shown (1 of 2)]
[im 1/31]
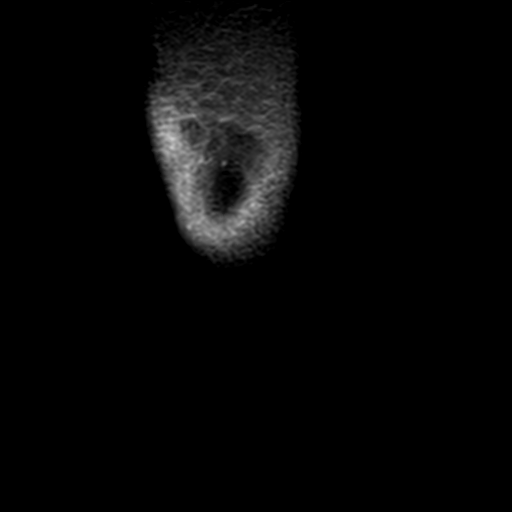
[im 6/31]
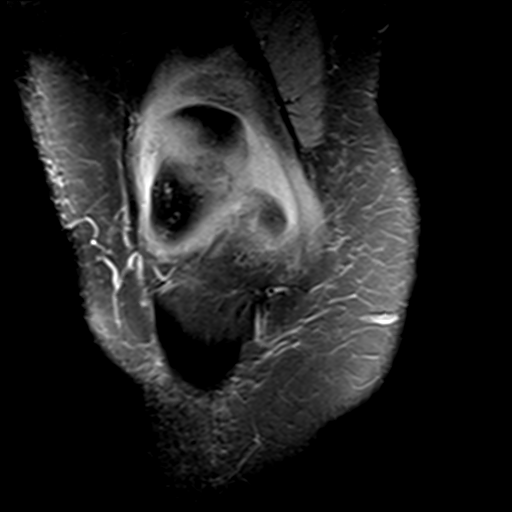
[im 11/31]
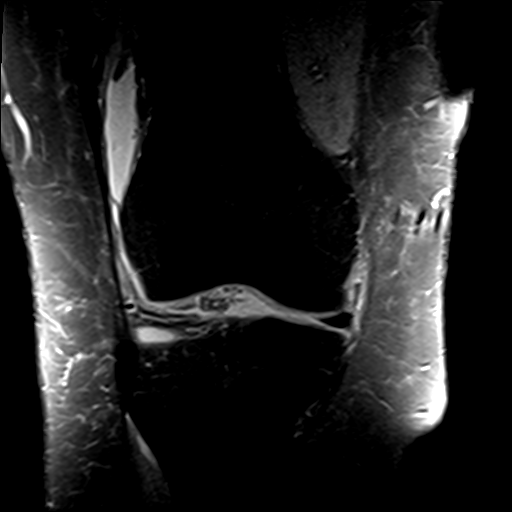
[im 16/31]
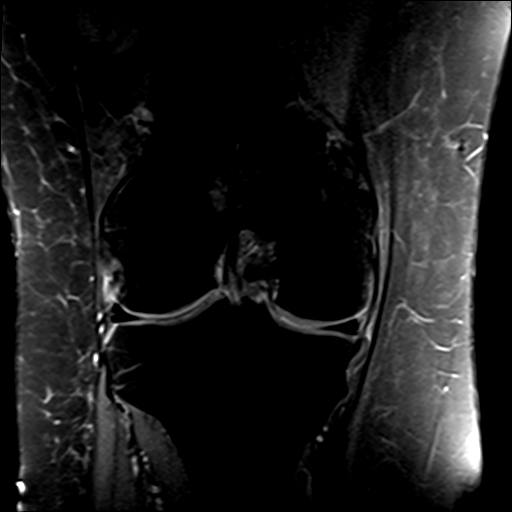
[im 21/31]
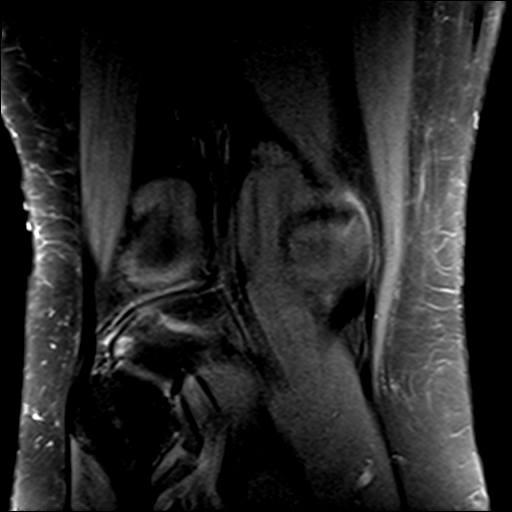
[im 26/31]
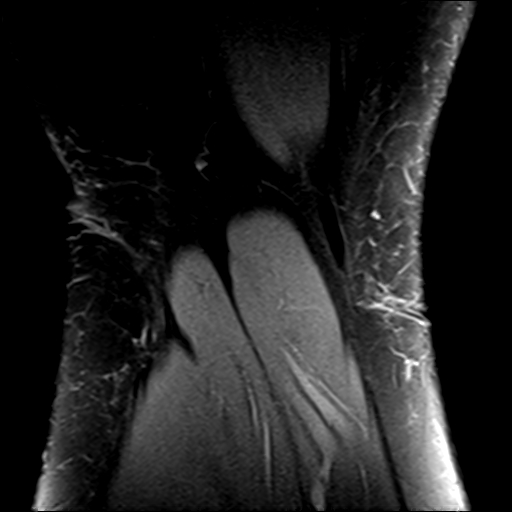
[im 31/31]
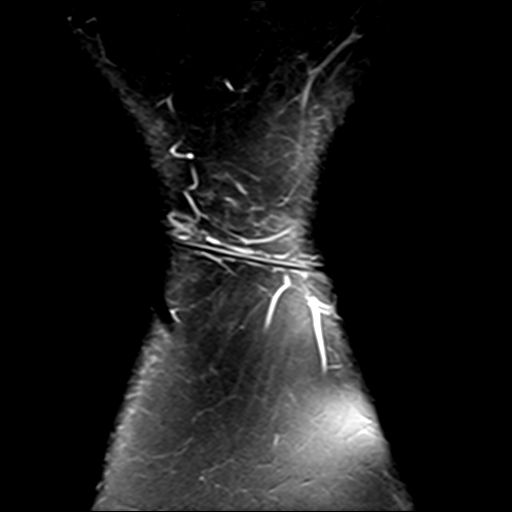

[Series 8: PD fat-sat · sagittal · 3.0mm · 0.29mm/px · 7 of 32 slices shown (2 of 2)]
[im 1/32]
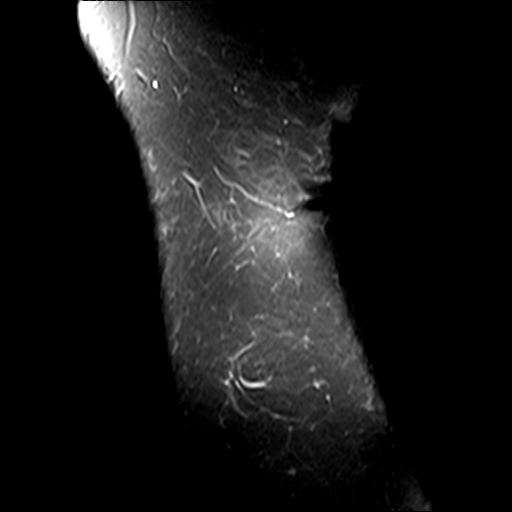
[im 6/32]
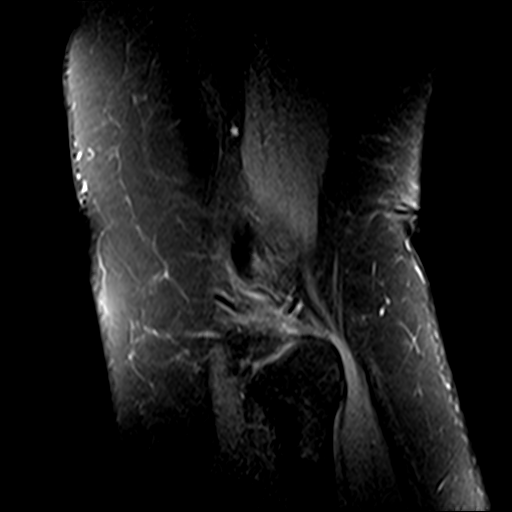
[im 11/32]
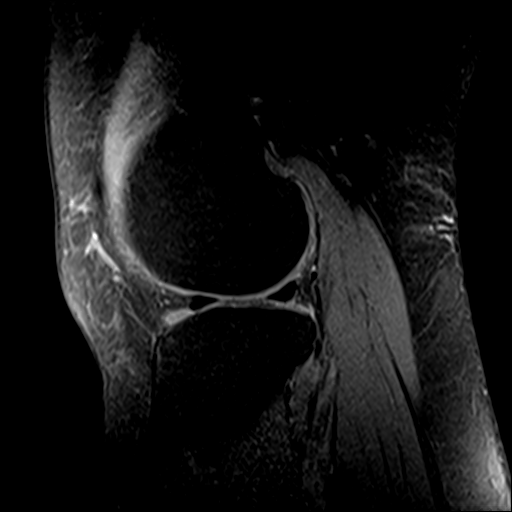
[im 16/32]
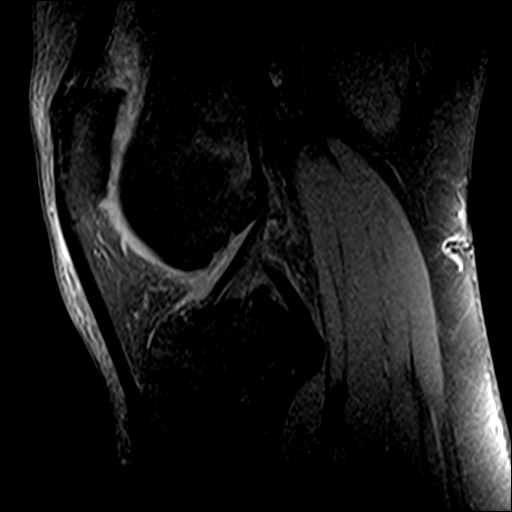
[im 21/32]
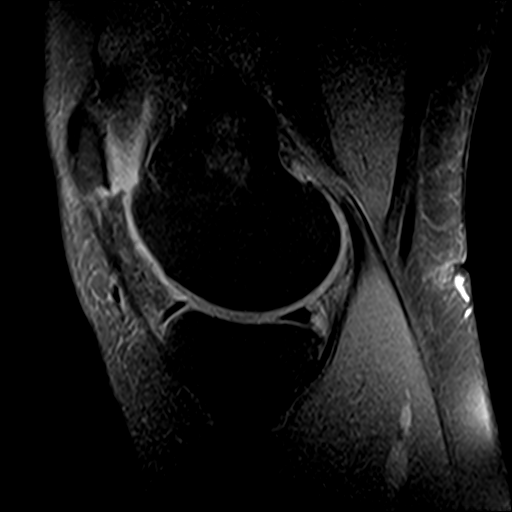
[im 26/32]
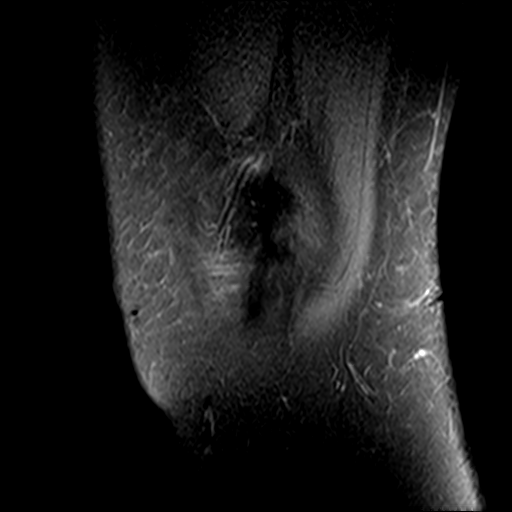
[im 32/32]
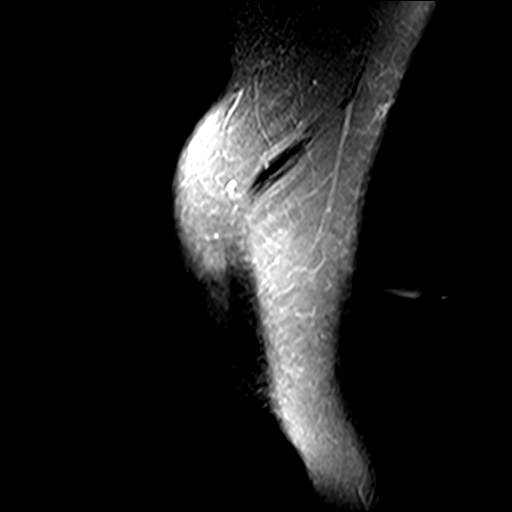

[23 of 40 positions shown; findings below may reference images not displayed]

FINDINGS: MENISCI

Medial meniscus:  Intact.

Lateral meniscus:  Intact.

LIGAMENTS

Cruciates:  Intact.

Collaterals:  Intact.

CARTILAGE

Patellofemoral: Mild cartilage thinning and small foci of
subchondral edema are seen in the lateral patellar facet and femoral
trochlea. The patella rides on the lateral femoral trochlea. Medial
plica without chondromalacia patella noted.

Medial:  Minimally degenerated.

Lateral:  Preserved.

Joint:  Small effusion.

Popliteal Fossa:  No Baker's cyst.

Extensor Mechanism:  Intact.  Patella alta noted.

Bones:  No fracture or focal lesion.

Other: None.
IMPRESSION: 1. Negative for meniscal or ligament tear.
2. Mild patellofemoral and medial compartment osteoarthritis.
3. Patella alta.

## 2021-08-18 ENCOUNTER — Other Ambulatory Visit: Payer: Self-pay | Admitting: Neurology

## 2021-08-18 DIAGNOSIS — G43709 Chronic migraine without aura, not intractable, without status migrainosus: Secondary | ICD-10-CM

## 2021-09-10 ENCOUNTER — Telehealth: Payer: Self-pay | Admitting: Neurology

## 2021-09-10 NOTE — Telephone Encounter (Signed)
Called pt to r/s 1/30 appt ([facility]) and VM box was full. Sent mychart msg.

## 2021-09-11 ENCOUNTER — Other Ambulatory Visit: Payer: Self-pay | Admitting: Neurology

## 2021-09-14 ENCOUNTER — Other Ambulatory Visit: Payer: Self-pay | Admitting: Neurology

## 2021-09-14 ENCOUNTER — Telehealth: Payer: BC Managed Care – PPO | Admitting: Neurology

## 2021-09-17 ENCOUNTER — Other Ambulatory Visit: Payer: Self-pay | Admitting: Allergy

## 2021-09-19 ENCOUNTER — Other Ambulatory Visit: Payer: Self-pay | Admitting: Neurology

## 2021-09-19 DIAGNOSIS — G43709 Chronic migraine without aura, not intractable, without status migrainosus: Secondary | ICD-10-CM

## 2021-09-21 NOTE — Telephone Encounter (Signed)
I rescheduled appt with Dr Jaynee Eagles 10-14-2021 at [place]  ([place] but she may be able to do mychart VV).

## 2021-10-05 ENCOUNTER — Ambulatory Visit: Payer: BC Managed Care – PPO | Admitting: Internal Medicine

## 2021-10-06 ENCOUNTER — Encounter: Payer: Self-pay | Admitting: Internal Medicine

## 2021-10-07 ENCOUNTER — Other Ambulatory Visit: Payer: Self-pay | Admitting: Allergy

## 2021-10-07 ENCOUNTER — Other Ambulatory Visit: Payer: Self-pay | Admitting: Internal Medicine

## 2021-10-07 NOTE — Telephone Encounter (Signed)
Requested Prescriptions  Pending Prescriptions Disp Refills   acyclovir (ZOVIRAX) 400 MG tablet [Pharmacy Med Name: ACYCLOVIR 400 MG TABLET] 180 tablet 1    Sig: TAKE 1 TABLET BY MOUTH TWICE A DAY     Antimicrobials:  Antiviral Agents - Anti-Herpetic Passed - 10/07/2021  2:05 AM      Passed - Valid encounter within last 12 months    Recent Outpatient Visits          6 months ago Annual physical exam   [place], MD   11 months ago [place], [place], MD   1 year ago Acute otitis media, unspecified otitis media type   [place], MD   1 year ago Herpes simplex vulvovaginitis   [place], MD   2 years ago History of migraine   [place], MD      Future Appointments            In 1 week Melvenia Beam, MD [facility]

## 2021-10-14 ENCOUNTER — Telehealth (INDEPENDENT_AMBULATORY_CARE_PROVIDER_SITE_OTHER): Payer: Self-pay | Admitting: Neurology

## 2021-10-14 DIAGNOSIS — Z91199 Patient's noncompliance with other medical treatment and regimen due to unspecified reason: Secondary | ICD-10-CM

## 2021-10-14 NOTE — Progress Notes (Signed)
Patient no showed

## 2021-10-18 ENCOUNTER — Other Ambulatory Visit: Payer: Self-pay | Admitting: Neurology

## 2021-10-18 DIAGNOSIS — G43709 Chronic migraine without aura, not intractable, without status migrainosus: Secondary | ICD-10-CM

## 2021-12-09 ENCOUNTER — Encounter: Payer: Self-pay | Admitting: Internal Medicine

## 2021-12-16 ENCOUNTER — Other Ambulatory Visit: Payer: Self-pay | Admitting: Neurology

## 2022-01-07 ENCOUNTER — Other Ambulatory Visit: Payer: Self-pay | Admitting: Internal Medicine

## 2022-01-25 ENCOUNTER — Other Ambulatory Visit: Payer: Self-pay

## 2022-01-25 ENCOUNTER — Emergency Department (HOSPITAL_BASED_OUTPATIENT_CLINIC_OR_DEPARTMENT_OTHER)
Admission: EM | Admit: 2022-01-25 | Discharge: 2022-01-25 | Disposition: A | Discharge status: Home / Self Care | Payer: Managed Care, Other (non HMO) | Attending: Emergency Medicine | Admitting: Emergency Medicine

## 2022-01-25 ENCOUNTER — Encounter (HOSPITAL_BASED_OUTPATIENT_CLINIC_OR_DEPARTMENT_OTHER): Payer: Self-pay | Admitting: *Deleted

## 2022-01-25 DIAGNOSIS — Z7951 Long term (current) use of inhaled steroids: Secondary | ICD-10-CM | POA: Insufficient documentation

## 2022-01-25 DIAGNOSIS — Z91010 Allergy to peanuts: Secondary | ICD-10-CM | POA: Insufficient documentation

## 2022-01-25 DIAGNOSIS — T7840XA Allergy, unspecified, initial encounter: Secondary | ICD-10-CM | POA: Diagnosis present

## 2022-01-25 DIAGNOSIS — J45909 Unspecified asthma, uncomplicated: Secondary | ICD-10-CM | POA: Diagnosis not present

## 2022-01-25 DIAGNOSIS — R251 Tremor, unspecified: Secondary | ICD-10-CM | POA: Insufficient documentation

## 2022-01-25 MED ORDER — EPINEPHRINE 0.3 MG/0.3ML IJ SOAJ: 0.3000 mg | INTRAMUSCULAR | 0 refills | Status: DC | PRN

## 2022-01-25 NOTE — Discharge Instructions (Signed)
I have refilled your prescription for your EpiPen.  Please return to the ED if you have return of your symptoms.  Follow-up with your PCP as needed.

## 2022-01-25 NOTE — ED Notes (Signed)
Pt verbalizes understanding of discharge instructions. Opportunity for questioning and answers were provided. Pt discharged from ED to home with husband.    
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
## 2022-02-24 ENCOUNTER — Other Ambulatory Visit: Payer: Self-pay | Admitting: Neurology

## 2022-02-24 ENCOUNTER — Other Ambulatory Visit: Payer: Self-pay | Admitting: Allergy

## 2022-02-24 DIAGNOSIS — G43709 Chronic migraine without aura, not intractable, without status migrainosus: Secondary | ICD-10-CM

## 2022-03-06 ENCOUNTER — Encounter: Payer: Self-pay | Admitting: Internal Medicine

## 2022-03-08 ENCOUNTER — Ambulatory Visit: Payer: Managed Care, Other (non HMO) | Admitting: Physician Assistant

## 2022-03-08 ENCOUNTER — Ambulatory Visit: Payer: Self-pay | Admitting: *Deleted

## 2022-03-08 ENCOUNTER — Encounter: Payer: Self-pay | Admitting: Physician Assistant

## 2022-03-08 VITALS — BP 142/97 | HR 89 | Resp 18 | Ht 64.0 in | Wt 223.0 lb

## 2022-03-08 DIAGNOSIS — I10 Essential (primary) hypertension: Secondary | ICD-10-CM

## 2022-03-08 MED ORDER — HYDROCHLOROTHIAZIDE 12.5 MG PO TABS: 12.5000 mg | ORAL_TABLET | Freq: Every day | ORAL | 1 refills | Status: DC

## 2022-03-08 NOTE — Telephone Encounter (Signed)
  Chief Complaint: elevated BP Symptoms: headache, fatigue Frequency:   Pertinent Negatives: Patient denies chest pain, difficulty breathing Disposition: '[]'$ ED /'[]'$ Urgent Care (no appt availability in office) / '[]'$ Appointment(In office/virtual)/ '[]'$  [facility]/ '[]'$ Home Care/ '[]'$ Refused Recommended Disposition /'[x]'$ [facility]/ '[]'$  Follow-up with PCP Additional Notes: Advised Mobile un[facility]- no open appointment

## 2022-03-08 NOTE — Progress Notes (Unsigned)
Established Patient Office Visit  Subjective   Patient ID: Carla Buck, female    DOB: 12-21-1980  Age: 41 y.o. MRN: [id number]  Chief Complaint  Patient presents with   Hypertension    Friday was 170/123 at dr. Vertis Kelch.     States that she has been having elevated blood pressure readings, states that when she was seen by psychiatry her blood pressure was elevated and she was encouraged to start checking her blood pressure at home.  States that her readings have continued to be elevated similar to today's or slightly higher.  States that she does take verapamil twice a day for migraine prevention.  States that she has been on Effexor for 100 mg twice a day for 2 years, denies any recent increases in dosing.  States that she has also been experiencing more frequent headaches.  States that she drinks 3 bottles of water, some Crystal light and approximately 32 ounces of caffeinated beverages daily.  States that she does exercise 5 days a week.  States that she does not pay attention to sodium take.     Past Medical History:  Diagnosis Date   Allergy    Anxiety    Asthma    Depression    Eczema    Hearing impairment    Wears hearing aids   Migraine    Urticaria    Social History   Socioeconomic History   Marital status: Married    Spouse name: Not on file   Number of children: 3   Years of education: Not on file   Highest education level: Master's degree (e.g., MA, MS, MEng, MEd, MSW, MBA)  Occupational History   Not on file  Tobacco Use   Smoking status: Never   Smokeless tobacco: Never  Vaping Use   Vaping Use: Never used  Substance and Sexual Activity   Alcohol use: Yes    Comment: occasional   Drug use: Never   Sexual activity: Not on file  Other Topics Concern   Not on file  Social History Narrative   Lives at home with her children    Right handed   Caffeine: 2-3 cups/day   Social Determinants of Health   Financial Resource Strain: Not on file  Food  Insecurity: Not on file  Transportation Needs: Not on file  Physical Activity: Not on file  Stress: Not on file  Social Connections: Not on file  Intimate Partner Violence: Not on file   Family History  Problem Relation Age of Onset   Hypertension Mother    Allergic rhinitis Mother    Asthma Mother    Eczema Mother    Depression Father    Asthma Father    Bipolar disorder Sister    Asthma Sister    Hypertension Maternal Grandmother    Hypertension Paternal Grandmother    Diabetes Paternal Grandmother    Hypertension Paternal Grandfather    Migraines Neg Hx    Allergies  Allergen Reactions   Bee Venom Anaphylaxis   Peanut-Containing Drug Products Anaphylaxis   Pollen Extract Shortness Of Breath    Tree and grass    Shellfish Allergy Anaphylaxis   Wasp Venom Anaphylaxis   Xolair [Omalizumab] Anaphylaxis   Amoxicillin Hives   Bug Itch Releaf [Misc Natural Products] Hives    Roaches, ants and dustmites   Contrave [Naltrexone-Bupropion Hcl Er] Hives   Corn-Containing Products     GI unset   Dust Mite Extract     Asthma trigger  Gluten Meal     GI upset, HIVeS   Iodine Hives   Lidocaine Hives   Orange Fruit [Citrus] Hives   Penicillins Hives   Sesame Oil Diarrhea    GI upset   Soy Allergy Hives    GI upset   Tetracaine Hives   Tomato Hives    Review of Systems  Constitutional: Negative.   HENT: Negative.    Eyes:  Negative for blurred vision.  Respiratory:  Negative for shortness of breath.   Cardiovascular:  Negative for chest pain.  Gastrointestinal: Negative.   Genitourinary: Negative.   Musculoskeletal: Negative.   Skin: Negative.   Neurological:  Positive for headaches. Negative for dizziness and weakness.  Endo/Heme/Allergies: Negative.   Psychiatric/Behavioral: Negative.        Objective:     BP (!) 142/97 (BP Location: Left Arm, Patient Position: Sitting, Cuff Size: Normal)   Pulse 89   Resp 18   Ht '5\' 4"'$  (1.626 m)   Wt 223 lb (101.2 kg)    SpO2 98%   BMI 38.28 kg/m  BP Readings from Last 3 Encounters:  03/08/22 (!) 142/97  01/25/22 110/89  04/03/21 110/82   Wt Readings from Last 3 Encounters:  03/08/22 223 lb (101.2 kg)  04/03/21 212 lb 9.6 oz (96.4 kg)  03/16/21 214 lb 3.2 oz (97.2 kg)      Physical Exam Vitals and nursing note reviewed.  Constitutional:      Appearance: Normal appearance.  HENT:     Head: Normocephalic and atraumatic.     Right Ear: External ear normal.     Left Ear: External ear normal.     Nose: Nose normal.     Mouth/Throat:     Mouth: Mucous membranes are moist.     Pharynx: Oropharynx is clear.  Eyes:     Extraocular Movements: Extraocular movements intact.     Conjunctiva/sclera: Conjunctivae normal.     Pupils: Pupils are equal, round, and reactive to light.  Cardiovascular:     Rate and Rhythm: Normal rate and regular rhythm.     Pulses: Normal pulses.     Heart sounds: Normal heart sounds.  Pulmonary:     Effort: Pulmonary effort is normal.     Breath sounds: Normal breath sounds.  Musculoskeletal:        General: Normal range of motion.     Cervical back: Normal range of motion and neck supple.  Skin:    General: Skin is warm and dry.  Neurological:     General: No focal deficit present.     Mental Status: She is alert and oriented to person, place, and time.  Psychiatric:        Mood and Affect: Mood normal.        Behavior: Behavior normal.        Thought Content: Thought content normal.        Judgment: Judgment normal.        Assessment & Plan:   Problem List Items Addressed This Visit   None Visit Diagnoses     Essential hypertension    -  Primary   Relevant Medications   hydrochlorothiazide (HYDRODIURIL) 12.5 MG tablet   Other Relevant Orders   CBC with Differential/Platelet   Comp. Metabolic Panel (12)   TSH      1. Essential hypertension Trial hydrochlorothiazide.  Patient encouraged to continue checking blood pressure at home, keep a  written log and have available for all office visits.  Patient education given on low-sodium diet.  Patient encouraged to explore DNA testing for psychiatric medications, patient has experienced adverse effects to several psychiatric medications, and although she has taken effexor for 2 years, this still does have a potential side effect of elevated blood pressure.  Red flags given for prompt reevaluation.  Patient has upcoming appointment with primary care provider. - CBC with Differential/Platelet - Comp. Metabolic Panel (12) - TSH - hydrochlorothiazide (HYDRODIURIL) 12.5 MG tablet; Take 1 tablet (12.5 mg total) by mouth daily.  Dispense: 30 tablet; Refill: 1    I have reviewed the patient's medical history (PMH, PSH, Social History, Family History, Medications, and allergies) , and have been updated if relevant. I spent 30 minutes reviewing chart and  face to face time with patient.   Return in about 15 days (around 03/23/2022) for with Dr. Wynetta Emery.    Loraine Grip Mayers, PA-C

## 2022-03-08 NOTE — Patient Instructions (Signed)
You are going to start taking hydrochlorothiazide 12.5 mg once daily in the morning.  I do encourage you to continue checking your blood pressure at home, keeping a written log and having available for all office visits.  I encourage you to follow a low-sodium diet, you should aim for 1500 to 2000 mg of sodium a day.  I do encourage you to investigate further DNA testing for psychiatric medications.  The test names are GeneSight and Geno mind.  We will call you with today's lab results.  Kennieth Rad, PA-C Physician Assistant [place] [email or web address]   How to Take Your Blood Pressure Blood pressure is a measurement of how strongly your blood is pressing against the walls of your arteries. Arteries are blood vessels that carry blood from your heart throughout your body. Your health care provider takes your blood pressure at each office visit. You can also take your own blood pressure at home with a blood pressure monitor. You may need to take your own blood pressure to: Confirm a diagnosis of high blood pressure (hypertension). Monitor your blood pressure over time. Make sure your blood pressure medicine is working. Supplies needed: Blood pressure monitor. A chair to sit in. This should be a chair where you can sit upright with your back supported. Do not sit on a soft couch or an armchair. Table or desk. Small notebook and pencil or pen. How to prepare To get the most accurate reading, avoid the following for 30 minutes before you check your blood pressure: Drinking caffeine. Drinking alcohol. Eating. Smoking. Exercising. Five minutes before you check your blood pressure: Use the bathroom and urinate so that you have an empty bladder. Sit quietly in a chair. Do not talk. How to take your blood pressure To check your blood pressure, follow the instructions in the manual that came with your blood pressure monitor. If  you have a digital blood pressure monitor, the instructions may be as follows: Sit up straight in a chair. Place your feet on the floor. Do not cross your ankles or legs. Rest your left arm at the level of your heart on a table or desk or on the arm of a chair. Pull up your shirt sleeve. Wrap the blood pressure cuff around the upper part of your left arm, 1 inch (2.5 cm) above your elbow. It is best to wrap the cuff around bare skin. Fit the cuff snugly, but not too tightly, around your arm. You should be able to place only one finger between the cuff and your arm. Position the cord so that it rests in the bend of your elbow. Press the power button. Sit quietly while the cuff inflates and deflates. Read the digital reading on the monitor screen and write the numbers down (record them) in a notebook. Wait 2-3 minutes, then repeat the steps, starting at step 1. What does my blood pressure reading mean? A blood pressure reading consists of a higher number over a lower number. Ideally, your blood pressure should be below 120/80. The first ("top") number is called the systolic pressure. It is a measure of the pressure in your arteries as your heart beats. The second ("bottom") number is called the diastolic pressure. It is a measure of the pressure in your arteries as the heart relaxes. Blood pressure is classified into four stages. The following are the stages for adults who do not have a short-term serious illness or a chronic condition. Systolic pressure and diastolic pressure are  measured in a unit called mm Hg (millimeters of mercury).  Normal Systolic pressure: below 798. Diastolic pressure: below 80. Elevated Systolic pressure: 921-194. Diastolic pressure: below 80. Hypertension stage 1 Systolic pressure: 174-081. Diastolic pressure: 44-81. Hypertension stage 2 Systolic pressure: 856 or above. Diastolic pressure: 90 or above. You can have elevated blood pressure or hypertension even if  only the systolic or only the diastolic number in your reading is higher than normal. Follow these instructions at home: Medicines Take over-the-counter and prescription medicines only as told by your health care provider. Tell your health care provider if you are having any side effects from blood pressure medicine. General instructions Check your blood pressure as often as recommended by your health care provider. Check your blood pressure at the same time every day. Take your monitor to the next appointment with your health care provider to make sure that: You are using it correctly. It provides accurate readings. Understand what your goal blood pressure numbers are. Keep all follow-up visits. This is important. General tips Your health care provider can suggest a reliable monitor that will meet your needs. There are several types of home blood pressure monitors. Choose a monitor that has an arm cuff. Do not choose a monitor that measures your blood pressure from your wrist or finger. Choose a cuff that wraps snugly, not too tight or too loose, around your upper arm. You should be able to fit only one finger between your arm and the cuff. You can buy a blood pressure monitor at most drugstores or online. Where to find more information American Heart Association: www.heart.org Contact a health care provider if: Your blood pressure is consistently high. Your blood pressure is suddenly low. Get help right away if: Your systolic blood pressure is higher than 180. Your diastolic blood pressure is higher than 120. These symptoms may be an emergency. Get help right away. Call 911. Do not wait to see if the symptoms will go away. Do not drive yourself to the hospital. Summary Blood pressure is a measurement of how strongly your blood is pressing against the walls of your arteries. A blood pressure reading consists of a higher number over a lower number. Ideally, your blood pressure should  be below 120/80. Check your blood pressure at the same time every day. Avoid caffeine, alcohol, smoking, and exercise for 30 minutes prior to checking your blood pressure. These agents can affect the accuracy of the blood pressure reading. This information is not intended to replace advice given to you by your health care provider. Make sure you discuss any questions you have with your health care provider. Document Revised: 04/16/2021 Document Reviewed: 04/16/2021 Elsevier Patient Education  [place] With Less [employer] with less salt is one way to reduce the amount of sodium you get from food. Sodium is one of the elements that make up salt. It is found naturally in foods and is also added to certain foods. Depending on your condition and overall health, your health care provider or dietitian may recommend that you reduce your sodium intake. Most people should have less than 2,300 milligrams (mg) of sodium each day. If you have high blood pressure (hypertension), you may need to limit your sodium to 1,500 mg each day. Follow the tips below to help reduce your sodium intake. What are tips for eating less sodium? Reading food labels  Check the food label before buying or using packaged ingredients. Always check the label for the serving  size and sodium content. Look for products with no more than 140 mg of sodium in one serving. Check the % Daily Value column to see what percent of the daily recommended amount of sodium is provided in one serving of the product. Foods with 5% or less in this column are considered low in sodium. Foods with 20% or higher are considered high in sodium. Do not choose foods with salt as one of the first three ingredients on the ingredients list. If salt is one of the first three ingredients, it usually means the item is high in sodium. Shopping Buy sodium-free or low-sodium products. Look for the following words on food  labels: Low-sodium. Sodium-free. Reduced-sodium. No salt added. Unsalted. Always check the sodium content even if foods are labeled as low-sodium or no salt added. Buy fresh foods. Cooking Use herbs, seasonings without salt, and spices as substitutes for salt. Use sodium-free baking soda when baking. Grill, braise, or roast foods to add flavor with less salt. Avoid adding salt to pasta, rice, or hot cereals. Drain and rinse canned vegetables, beans, and meat before use. Avoid adding salt when cooking sweets and desserts. Cook with low-sodium ingredients. What foods are high in sodium? Vegetables Regular canned vegetables (not low-sodium or reduced-sodium). Sauerkraut, pickled vegetables, and relishes. Olives. [place] fries. Onion rings. Regular canned tomato sauce and paste. Regular tomato and vegetable juice. Frozen vegetables in sauces. Grains Instant hot cereals. Bread stuffing, pancake, and biscuit mixes. Croutons. Seasoned rice or pasta mixes. Noodle soup cups. Boxed or frozen macaroni and cheese. Regular salted crackers. Self-rising flour. Rolls. Bagels. Flour tortillas and wraps. Meats and other proteins Meat or fish that is salted, canned, smoked, cured, spiced, or pickled. This includes bacon, ham, sausages, hot dogs, corned beef, chipped beef, meat loaves, salt pork, jerky, pickled herring, anchovies, regular canned tuna, and sardines. Salted nuts. Dairy Processed cheese and cheese spreads. Cheese curds. Blue cheese. Feta cheese. String cheese. Regular cottage cheese. Buttermilk. Canned milk. The items listed above may not be a complete list of foods high in sodium. Actual amounts of sodium may be different depending on processing. Contact a dietitian for more information. What foods are low in sodium? Fruits Fresh, frozen, or canned fruit with no sauce added. Fruit juice. Vegetables Fresh or frozen vegetables with no sauce added. "No salt added" canned vegetables. "No salt  added" tomato sauce and paste. Low-sodium or reduced-sodium tomato and vegetable juice. Grains Noodles, pasta, quinoa, rice. Shredded or puffed wheat or puffed rice. Regular or quick oats (not instant). Low-sodium crackers. Low-sodium bread. Whole-grain bread and whole-grain pasta. Unsalted popcorn. Meats and other proteins Fresh or frozen whole meats, poultry (not injected with sodium), and fish with no sauce added. Unsalted nuts. Dried peas, beans, and lentils without added salt. Unsalted canned beans. Eggs. Unsalted nut butters. Low-sodium canned tuna or chicken. Dairy Milk. Soy milk. Yogurt. Low-sodium cheeses, such as Swiss, Monterey Jack, [place], and [employer]. Sherbet or ice cream (keep to  cup per serving). Cream cheese. Fats and oils Unsalted butter or margarine. Other foods Homemade pudding. Sodium-free baking soda and baking powder. Herbs and spices. Low-sodium seasoning mixes. Beverages Coffee and tea. Carbonated beverages. The items listed above may not be a complete list of foods low in sodium. Actual amounts of sodium may be different depending on processing. Contact a dietitian for more information. What are some salt alternatives when cooking? The following are herbs, seasonings, and spices that can be used instead of salt to flavor your food.  Herbs should be fresh or dried. Do not choose packaged mixes. Next to the name of the herb, spice, or seasoning are some examples of foods you can pair it with. Herbs [place] leaves - Soups, meat and vegetable dishes, and spaghetti sauce. Basil - [employer], soups, pasta, and fish dishes. Cilantro - Meat, poultry, and vegetable dishes. Chili powder - Marinades and Mexican dishes. Chives - Salad dressings and potato dishes. Cumin - Mexican dishes, couscous, and meat dishes. Dill - Fish dishes, sauces, and salads. Fennel - Meat and vegetable dishes, breads, and cookies. Garlic (do not use garlic salt) - [place] dishes, meat dishes,  salad dressings, and sauces. Marjoram - Soups, potato dishes, and meat dishes. Oregano - Pizza and spaghetti sauce. Parsley - Salads, soups, pasta, and meat dishes. Rosemary - [place] dishes, salad dressings, soups, and red meats. Saffron - Fish dishes, pasta, and some poultry dishes. Sage - Stuffings and sauces. Tarragon - Fish and [employer]. Thyme - Stuffing, meat, and fish dishes. Seasonings Lemon juice - Fish dishes, poultry dishes, vegetables, and salads. Vinegar - Salad dressings, vegetables, and fish dishes. Spices Cinnamon - Sweet dishes, such as cakes, cookies, and puddings. Cloves - Gingerbread, puddings, and marinades for meats. Curry - Vegetable dishes, fish and poultry dishes, and stir-fry dishes. Ginger - Vegetable dishes, fish dishes, and stir-fry dishes. Nutmeg - Pasta, vegetables, poultry, fish dishes, and custard. Summary Cooking with less salt is one way to reduce the amount of sodium that you get from food. Buy sodium-free or low-sodium products. Check the food label before using or buying packaged ingredients. Use herbs, seasonings without salt, and spices as substitutes for salt in foods. This information is not intended to replace advice given to you by your health care provider. Make sure you discuss any questions you have with your health care provider. Document Revised: 07/25/2019 Document Reviewed: 07/25/2019 Elsevier Patient Education  [place].

## 2022-03-08 NOTE — Telephone Encounter (Signed)
Reason for Disposition  Systolic BP  >= 809 OR Diastolic >= 983  Answer Assessment - Initial Assessment Questions 1. BLOOD PRESSURE: "What is the blood pressure?" "Did you take at least two measurements 5 minutes apart?"     155/114, 163/123 P 90 2. ONSET: "When did you take your blood pressure?"     7:00 3. HOW: "How did you take your blood pressure?" (e.g., automatic home BP monitor, visiting nurse)     Automatic cuff,wrist 4. HISTORY: "Do you have a history of high blood pressure?"     No 5. MEDICINES: "Are you taking any medicines for blood pressure?" "Have you missed any doses recently?"     Uses verapamil for migraines 6. OTHER SYMPTOMS: "Do you have any symptoms?" (e.g., blurred vision, chest pain, difficulty breathing, headache, weakness)     Headache, fatigue 7. PREGNANCY: "Is there any chance you are pregnant?" "When was your last menstrual period?"     N/a  Protocols used: Blood Pressure - High-A-AH

## 2022-03-09 LAB — COMP. METABOLIC PANEL (12)
AST: 41 IU/L — ABNORMAL HIGH (ref 0–40)
Albumin/Globulin Ratio: 1.6 (ref 1.2–2.2)
Albumin: 4 g/dL (ref 3.9–4.9)
Alkaline Phosphatase: 57 IU/L (ref 44–121)
BUN/Creatinine Ratio: 8 — ABNORMAL LOW (ref 9–23)
BUN: 8 mg/dL (ref 6–20)
Bilirubin Total: <0.2 mg/dL (ref 0.0–1.2)
Calcium: 8.7 mg/dL (ref 8.7–10.2)
Chloride: 104 mmol/L (ref 96–106)
Creatinine, Ser: 0.96 mg/dL (ref 0.57–1.00)
Globulin, Total: 2.5 g/dL (ref 1.5–4.5)
Glucose: 86 mg/dL (ref 70–99)
Potassium: 4.6 mmol/L (ref 3.5–5.2)
Sodium: 140 mmol/L (ref 134–144)
Total Protein: 6.5 g/dL (ref 6.0–8.5)
eGFR: 77 mL/min/{1.73_m2} (ref >59)

## 2022-03-09 LAB — CBC WITH DIFFERENTIAL/PLATELET
Basophils Absolute: 0 10*3/uL (ref 0.0–0.2)
Basos: 1 %
EOS (ABSOLUTE): 0.1 10*3/uL (ref 0.0–0.4)
Eos: 4 %
Hematocrit: 39.1 % (ref 34.0–46.6)
Hemoglobin: 13.1 g/dL (ref 11.1–15.9)
Immature Grans (Abs): 0 10*3/uL (ref 0.0–0.1)
Immature Granulocytes: 0 %
Lymphocytes Absolute: 1.7 10*3/uL (ref 0.7–3.1)
Lymphs: 43 %
MCH: 31.6 pg (ref 26.6–33.0)
MCHC: 33.5 g/dL (ref 31.5–35.7)
MCV: 94 fL (ref 79–97)
Monocytes Absolute: 0.3 10*3/uL (ref 0.1–0.9)
Monocytes: 8 %
Neutrophils Absolute: 1.7 10*3/uL (ref 1.4–7.0)
Neutrophils: 44 %
Platelets: 254 10*3/uL (ref 150–450)
RBC: 4.14 x10E6/uL (ref 3.77–5.28)
RDW: 12.5 % (ref 11.7–15.4)
WBC: 3.9 10*3/uL (ref 3.4–10.8)

## 2022-03-09 LAB — TSH: TSH: 2.08 u[IU]/mL (ref 0.450–4.500)

## 2022-03-11 NOTE — Telephone Encounter (Signed)
BP reading was 142/97 when see on Mobile unit yesterday.

## 2022-03-14 ENCOUNTER — Other Ambulatory Visit: Payer: Self-pay | Admitting: Neurology

## 2022-03-14 ENCOUNTER — Other Ambulatory Visit: Payer: Self-pay | Admitting: Allergy

## 2022-03-14 DIAGNOSIS — G43709 Chronic migraine without aura, not intractable, without status migrainosus: Secondary | ICD-10-CM

## 2022-03-23 ENCOUNTER — Encounter: Payer: Self-pay | Admitting: Internal Medicine

## 2022-03-23 ENCOUNTER — Ambulatory Visit: Payer: Managed Care, Other (non HMO) | Attending: Internal Medicine | Admitting: Internal Medicine

## 2022-03-23 VITALS — BP 133/94 | HR 81 | Temp 98.1°F | Ht 65.0 in | Wt 226.0 lb

## 2022-03-23 DIAGNOSIS — Z6379 Other stressful life events affecting family and household: Secondary | ICD-10-CM | POA: Diagnosis not present

## 2022-03-23 DIAGNOSIS — I10 Essential (primary) hypertension: Secondary | ICD-10-CM | POA: Diagnosis not present

## 2022-03-23 DIAGNOSIS — Z9109 Other allergy status, other than to drugs and biological substances: Secondary | ICD-10-CM

## 2022-03-23 MED ORDER — MONTELUKAST SODIUM 10 MG PO TABS
10.0000 mg | ORAL_TABLET | Freq: Every day | ORAL | 5 refills | Status: DC
Start: 2022-03-23 — End: 2022-09-17

## 2022-03-23 MED ORDER — HYDROCHLOROTHIAZIDE 25 MG PO TABS: 25.0000 mg | ORAL_TABLET | Freq: Every day | ORAL | 5 refills | Status: DC

## 2022-03-23 NOTE — Patient Instructions (Signed)
Your blood pressure is not at goal which is 130/80 or lower.  We have increased the hydrochlorothiazide to 25 mg daily.  Please continue to monitor your blood pressure and bring a log with you on your next visit.
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
## 2022-03-23 NOTE — Progress Notes (Signed)
Needs RF on Singular.  Pt requesting sleep meds.

## 2022-03-24 ENCOUNTER — Encounter: Payer: Self-pay | Admitting: Internal Medicine

## 2022-03-24 LAB — BASIC METABOLIC PANEL
BUN/Creatinine Ratio: 15 (ref 9–23)
BUN: 14 mg/dL (ref 6–20)
CO2: 22 mmol/L (ref 20–29)
Calcium: 9 mg/dL (ref 8.7–10.2)
Chloride: 105 mmol/L (ref 96–106)
Creatinine, Ser: 0.92 mg/dL (ref 0.57–1.00)
Glucose: 99 mg/dL (ref 70–99)
Potassium: 4.6 mmol/L (ref 3.5–5.2)
Sodium: 141 mmol/L (ref 134–144)
eGFR: 81 mL/min/{1.73_m2} (ref >59)

## 2022-03-30 ENCOUNTER — Other Ambulatory Visit: Payer: Self-pay | Admitting: Physician Assistant

## 2022-03-30 DIAGNOSIS — I10 Essential (primary) hypertension: Secondary | ICD-10-CM

## 2022-04-04 ENCOUNTER — Other Ambulatory Visit: Payer: Self-pay | Admitting: Internal Medicine

## 2022-04-07 ENCOUNTER — Other Ambulatory Visit: Payer: Self-pay | Admitting: Allergy

## 2022-06-04 ENCOUNTER — Encounter: Payer: Self-pay | Admitting: Internal Medicine

## 2022-06-04 ENCOUNTER — Ambulatory Visit: Payer: Managed Care, Other (non HMO) | Attending: Internal Medicine | Admitting: Internal Medicine

## 2022-06-04 VITALS — BP 118/83 | HR 71 | Temp 98.1°F | Ht 65.0 in | Wt 227.2 lb

## 2022-06-04 DIAGNOSIS — I10 Essential (primary) hypertension: Secondary | ICD-10-CM
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
## 2022-06-04 NOTE — Patient Instructions (Signed)
Continue hydrochlorothiazide 25 mg daily. Continue to monitor blood pressure.  I would recommend checking the blood pressure about 2 hours after you have taken the hydrochlorothiazide.  In 2 weeks, please send me the 5 most recent readings.  The blood pressure in the office is good today.  However if it continues to bounce around at home, we may need to add another blood pressure lowering medication.

## 2022-06-05 LAB — BASIC METABOLIC PANEL
BUN/Creatinine Ratio: 17 (ref 9–23)
BUN: 13 mg/dL (ref 6–20)
CO2: 25 mmol/L (ref 20–29)
Calcium: 8.7 mg/dL (ref 8.7–10.2)
Chloride: 101 mmol/L (ref 96–106)
Creatinine, Ser: 0.78 mg/dL (ref 0.57–1.00)
Glucose: 95 mg/dL (ref 70–99)
Potassium: 4 mmol/L (ref 3.5–5.2)
Sodium: 141 mmol/L (ref 134–144)
eGFR: 99 mL/min/{1.73_m2} (ref >59)

## 2022-06-25 ENCOUNTER — Encounter: Payer: Self-pay | Admitting: Internal Medicine

## 2022-06-26 ENCOUNTER — Other Ambulatory Visit: Payer: Self-pay | Admitting: Internal Medicine

## 2022-06-26 IMAGING — DX DG CHEST 2V
2 series · 2 of 2 positions shown · non-contrast
Comparison: Chest radiograph 07/19/2019

CLINICAL DATA: Shortness of breath.

EXAM:
CHEST - 2 VIEW

[chest pa]
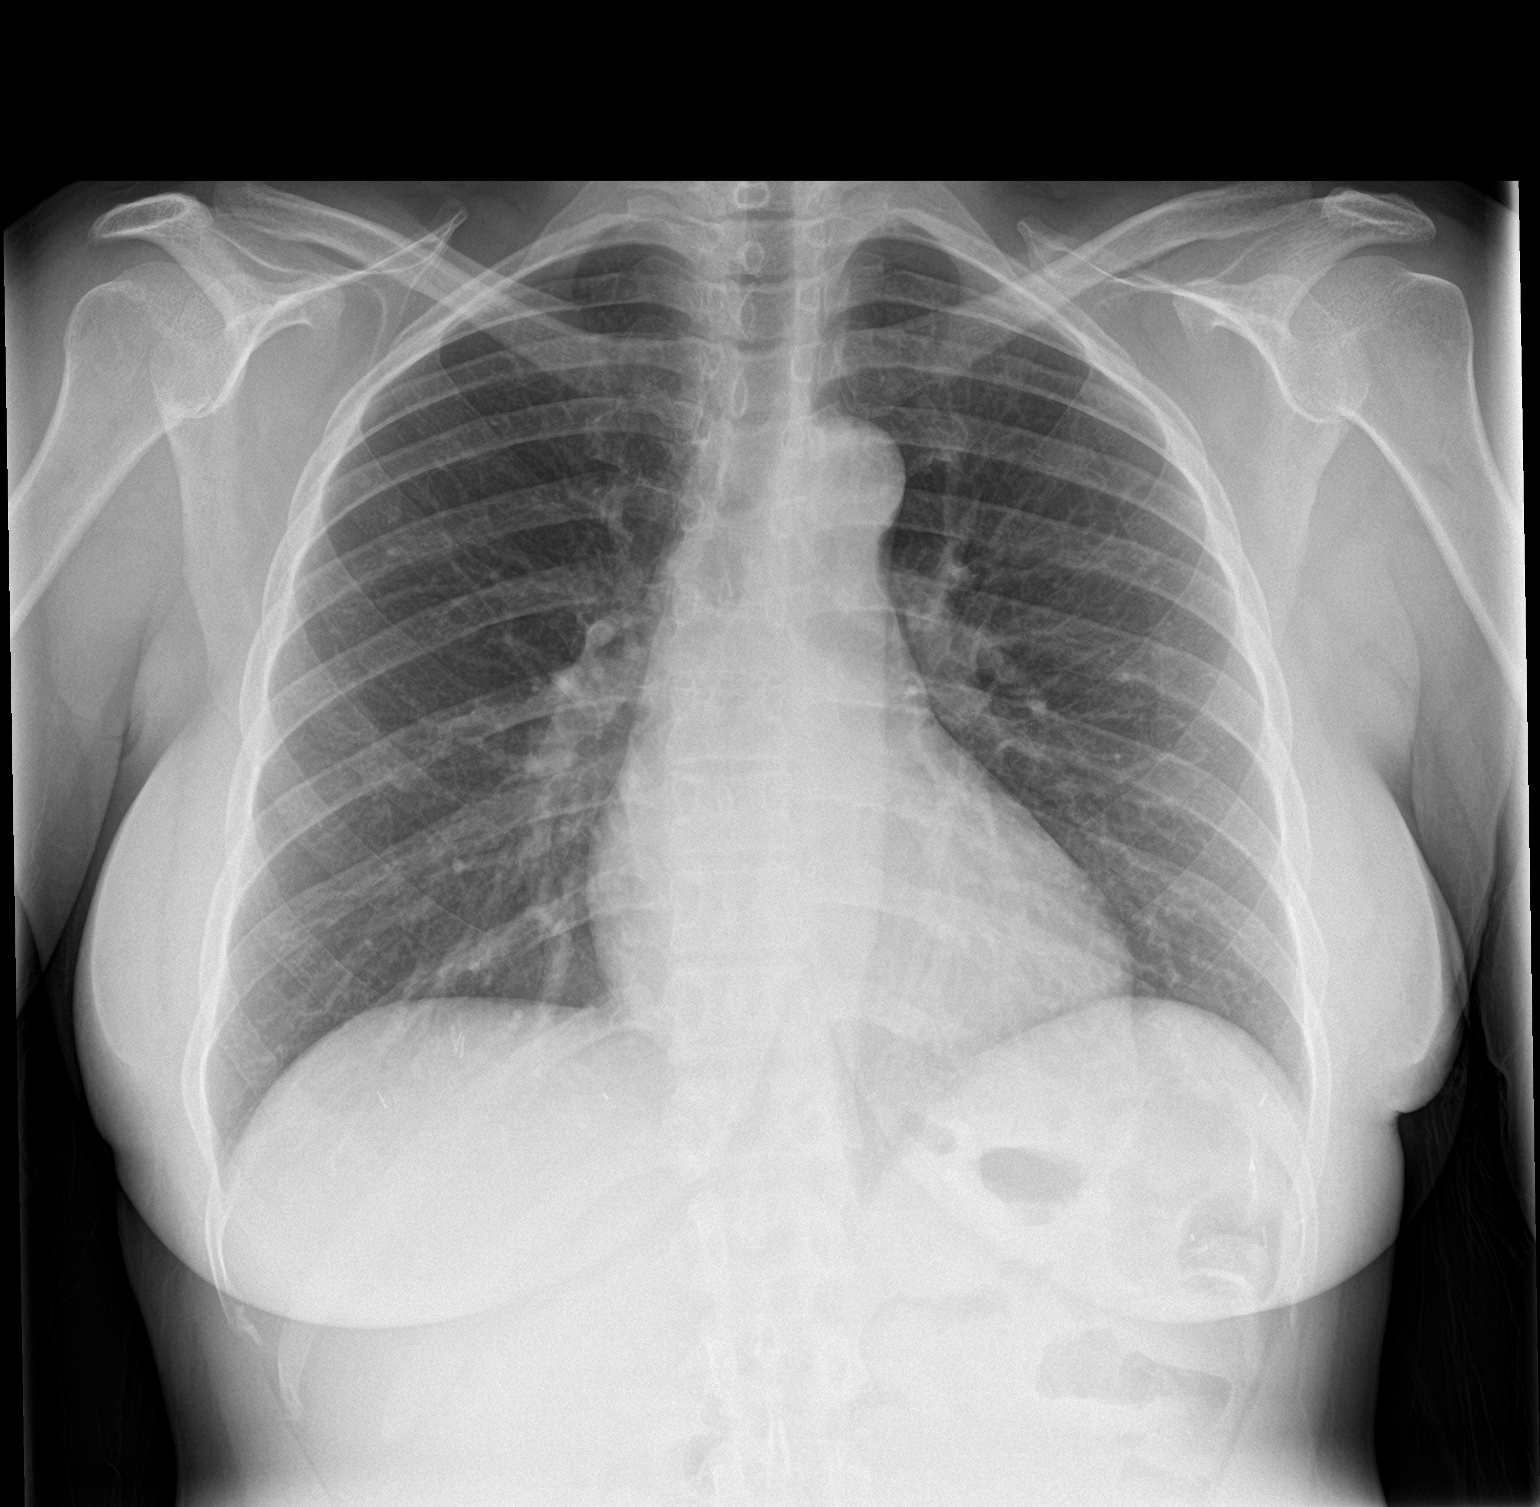

[chest lat]
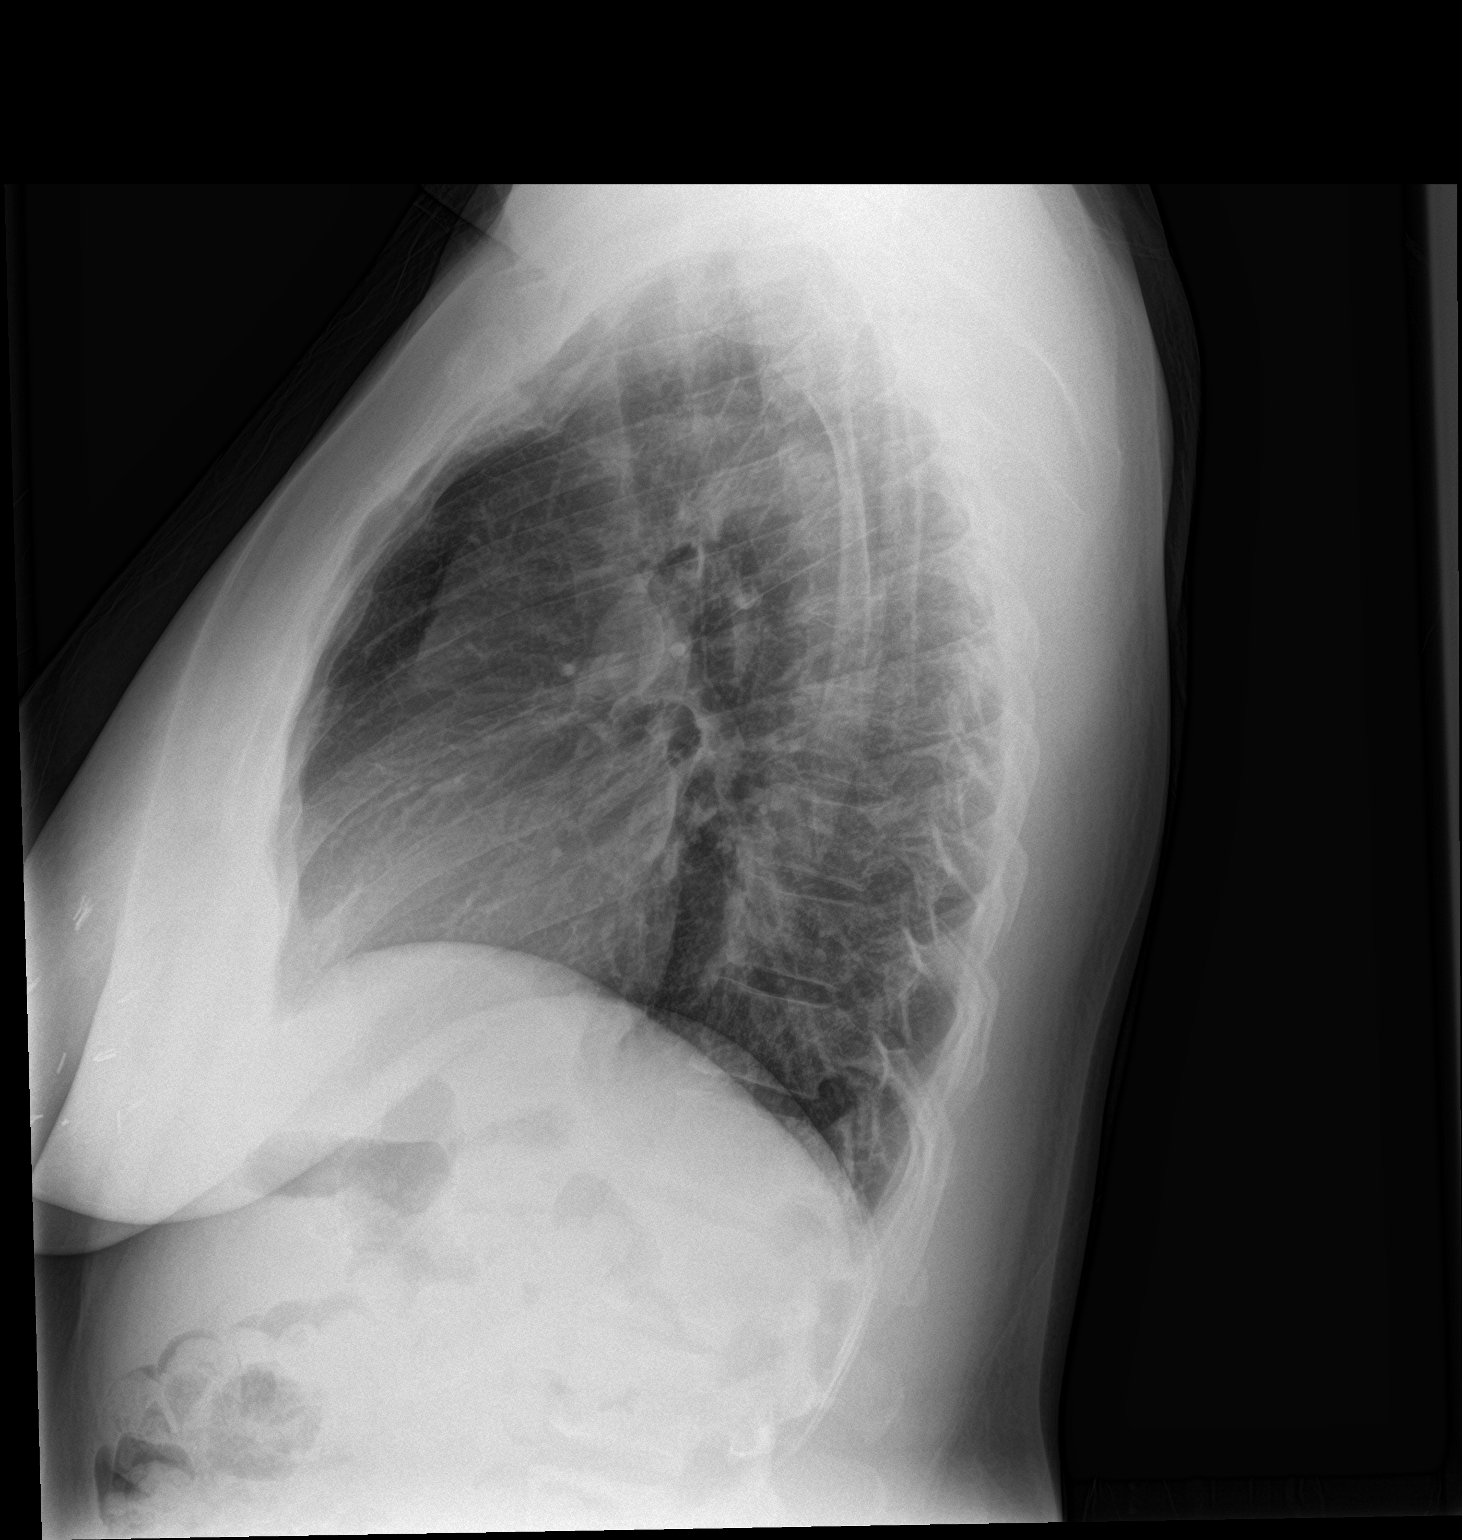

[2 of 2 positions shown; findings below may reference images not displayed]

FINDINGS: The heart size and mediastinal contours are within normal limits.
Both lungs are clear. No pleural effusions. No pneumothorax.
Bilateral breast clips. Mild broad-based thoracic dextrocurvature.
No acute osseous abnormality.
IMPRESSION: No acute cardiopulmonary disease.

## 2022-06-26 MED ORDER — VALSARTAN 40 MG PO TABS: 40.0000 mg | ORAL_TABLET | Freq: Every day | ORAL | 3 refills | Status: DC

## 2022-07-01 ENCOUNTER — Other Ambulatory Visit: Payer: Self-pay | Admitting: Internal Medicine

## 2022-07-06 ENCOUNTER — Encounter: Payer: Self-pay | Admitting: Internal Medicine

## 2022-07-06 ENCOUNTER — Ambulatory Visit: Payer: Self-pay

## 2022-07-06 NOTE — Telephone Encounter (Signed)
  Chief Complaint: hypotension Symptoms: BP 89/73, 107/60, dizziness and feeling lightheaded Frequency: today Pertinent Negatives: NA Disposition: '[]'$ ED /'[]'$ Urgent Care (no appt availability in office) / '[]'$ Appointment(In office/virtual)/ '[]'$  [facility]/ '[]'$ Home Care/ '[x]'$ Refused Recommended Disposition /'[]'$ [facility]/ '[]'$  Follow-up with PCP Additional Notes: pt states her normal routine is getting up, taking her meds, taking the kids to [place], [facility], and then coming home and checking BP. Today pt felt dizzy when leaving gym so she checked BP when she got home around 0900 and BP was 89/73 R arm, 107/60 L arm and she ate something and then lied down. I had pt recheck her BP and it was 125/87. Pt states she still feels dizzy and slight foggy headed. Offered pt appt for today or tomorrow. Pt refused. States she is going to monitor BP and if still having symptoms would call back to schedule appt. She started valsartan '40mg'$  on 'Sunday so unsure if that is causing low BP or not. I advised her to take her BP before she takes her meds in the morning as well. Pt verbalized understanding.    Reason for Disposition  [1] Systolic BP 90-110 AND [2] taking blood pressure medications AND [3] dizzy, lightheaded or weak  Answer Assessment - Initial Assessment Questions 1. BLOOD PRESSURE: "What is the blood pressure?" "Did you take at least two measurements 5 minutes apart?"     89'$ /73 R arm  107/60 L arm  2. ONSET: "When did you take your blood pressure?"     today 3. HOW: "How did you obtain the blood pressure?" (e.g., visiting nurse, automatic home BP monitor)     Home BP  4. HISTORY: "Do you have a history of low blood pressure?" "What is your blood pressure normally?"     no 5. MEDICINES: "Are you taking any medications for blood pressure?" If Yes, ask: "Have they been changed recently?"     Yes, started valsartan Sunday 7. OTHER SYMPTOMS: "Have you been sick recently?" "Have you  had a recent injury?"     Dizziness, feeling foggy headed  Protocols used: Blood Pressure - Low-A-AH

## 2022-08-02 ENCOUNTER — Encounter: Payer: Self-pay | Admitting: Internal Medicine

## 2022-08-02 ENCOUNTER — Ambulatory Visit: Payer: Managed Care, Other (non HMO) | Admitting: Internal Medicine

## 2022-08-02 ENCOUNTER — Ambulatory Visit: Payer: Managed Care, Other (non HMO) | Attending: Internal Medicine | Admitting: Internal Medicine

## 2022-08-02 VITALS — BP 114/83 | HR 96 | Temp 98.4°F | Ht 65.0 in | Wt 226.0 lb

## 2022-08-02 DIAGNOSIS — J988 Other specified respiratory disorders: Secondary | ICD-10-CM | POA: Diagnosis not present

## 2022-08-02 DIAGNOSIS — B9789 Other viral agents as the cause of diseases classified elsewhere: Secondary | ICD-10-CM

## 2022-08-02 DIAGNOSIS — J029 Acute pharyngitis, unspecified: Secondary | ICD-10-CM | POA: Diagnosis not present

## 2022-08-02 MED ORDER — AZITHROMYCIN 250 MG PO TABS: ORAL_TABLET | ORAL | 0 refills | Status: DC
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
## 2022-08-03 LAB — CBC WITH DIFF/PLATELET
Basophils Absolute: 0 10*3/uL (ref 0.0–0.2)
Basos: 0 %
EOS (ABSOLUTE): 0.1 10*3/uL (ref 0.0–0.4)
Eos: 1 %
Hematocrit: 39.3 % (ref 34.0–46.6)
Hemoglobin: 13 g/dL (ref 11.1–15.9)
Immature Grans (Abs): 0 10*3/uL (ref 0.0–0.1)
Immature Granulocytes: 0 %
Lymphocytes Absolute: 1.8 10*3/uL (ref 0.7–3.1)
Lymphs: 24 %
MCH: 31.3 pg (ref 26.6–33.0)
MCHC: 33.1 g/dL (ref 31.5–35.7)
MCV: 95 fL (ref 79–97)
Monocytes Absolute: 0.6 10*3/uL (ref 0.1–0.9)
Monocytes: 8 %
Neutrophils Absolute: 4.9 10*3/uL (ref 1.4–7.0)
Neutrophils: 67 %
Platelets: 274 10*3/uL (ref 150–450)
RBC: 4.15 x10E6/uL (ref 3.77–5.28)
RDW: 12.1 % (ref 11.7–15.4)
WBC: 7.4 10*3/uL (ref 3.4–10.8)

## 2022-08-03 LAB — POCT RAPID STREP A (OFFICE): Rapid Strep A Screen: POSITIVE — AB

## 2022-08-03 LAB — MONONUCLEOSIS SCREEN: Mono Screen: NEGATIVE

## 2022-08-04 LAB — NOVEL CORONAVIRUS, NAA: SARS-CoV-2, NAA: NOT DETECTED

## 2022-09-10 ENCOUNTER — Encounter: Payer: Self-pay | Admitting: Internal Medicine

## 2022-09-15 ENCOUNTER — Telehealth: Payer: Self-pay | Admitting: Internal Medicine

## 2022-09-15 NOTE — Telephone Encounter (Signed)
Called & spoke to the patient. Verified name & DOB. A telehealth appointment has been scheduled for 09/17/2022 at 8:10 a.m. Patient expressed verbal agreement.

## 2022-09-17 ENCOUNTER — Encounter: Payer: Self-pay | Admitting: Internal Medicine

## 2022-09-17 ENCOUNTER — Other Ambulatory Visit: Payer: Self-pay | Admitting: Internal Medicine

## 2022-09-17 ENCOUNTER — Telehealth (HOSPITAL_BASED_OUTPATIENT_CLINIC_OR_DEPARTMENT_OTHER): Payer: Managed Care, Other (non HMO) | Admitting: Internal Medicine

## 2022-09-17 VITALS — Ht 65.0 in | Wt 228.0 lb

## 2022-09-17 DIAGNOSIS — Z9109 Other allergy status, other than to drugs and biological substances: Secondary | ICD-10-CM

## 2022-09-17 DIAGNOSIS — Z6837 Body mass index (BMI) 37.0-37.9, adult: Secondary | ICD-10-CM | POA: Diagnosis not present

## 2022-09-17 DIAGNOSIS — I10 Essential (primary) hypertension: Secondary | ICD-10-CM

## 2022-09-17 MED ORDER — WEGOVY 0.25 MG/0.5ML ~~LOC~~ SOAJ: 0.2500 mg | SUBCUTANEOUS | 1 refills | Status: DC

## 2022-09-17 NOTE — Progress Notes (Signed)
Patient ID: Carla Buck, female   DOB: [birth year], 42 y.o.   MRN: [id number] Virtual Visit via Video Note  I connected with Carla Buck on 09/17/2022 at 8:33 AM by a video enabled telemedicine application and verified that I am speaking with the correct person using two identifiers.  Location: Patient: home Provider: Office   I discussed the limitations of evaluation and management by telemedicine and the availability of in person appointments. The patient expressed understanding and agreed to proceed.  History of Present Illness: Patient with history of HTN, asthma,  Allergies (food allergies and environmental allergies), papilloma (2 RT/1 LT breast), dep/anx/insomnia (followed by Dr. 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  In particular, she is requesting 8. Patient had Roux-en-Y procedure in 2020.  At that time weight was around 270 to 275 pounds.  She was working diligently on lifestyle changes with no significant weight loss.  She had hysterectomy with one of her ovaries removed.  She decided to have the Roux-en-Y procedure in 2020.  She got down to 185 pounds.  Subsequently went through a divorce and relocation.  However she continued trying to eat healthy and exercise regularly.  In 2022, her daughter became ill and this was another hit to her mental health.  She was placed on Seroquel to help with sleep and anxiety.  She was on it for about 6 months.  This contributed to weight gain.  It was finally discontinued 03/2022 and she was placed on BuSpar instead. She still eats small portions, does protein shakes in the mornings and goes to the gym 4 to 5 days a week where she does aerobics (30 to 45 minutes on the elliptical) and some light weight training.  She is wanting to stay healthy and to be able to do things with her kids.  She is in a  bariatric surgery support group.  She heard to talk on NPR several weeks ago about medications like Wegovy to help with weight loss.  She is currently at 228 pounds. Outpatient Encounter Medications as of 09/17/2022  Medication Sig   Semaglutide-Weight Management (WEGOVY) 0.25 MG/0.5ML SOAJ Inject 0.25 mg into the skin once a week.   acyclovir (ZOVIRAX) 400 MG tablet TAKE 1 TABLET BY MOUTH TWICE A DAY   albuterol (VENTOLIN HFA) 108 (90 Base) MCG/ACT inhaler INHALE 2 PUFFS EVERY 4-6 HOURS AS NEEDED FOR COUGH, WHEEZE, TIGHTNESS IN CHEST, SHORTNESS OF BREATH   Azelastine HCl 0.15 % SOLN SPRAY 1 TO 2 SPRAYS EACH NOSTRIL 1-2 TIMES A DAY AS NEEDED FOR RUNNY NOSE/DRAINAGE DOWN THROAT   azithromycin (ZITHROMAX Z-PAK) 250 MG tablet 2 tabs PO x 1 then 1 tab daily   busPIRone (BUSPAR) 5 MG tablet Take 5 mg by mouth in the morning and at bedtime.   diphenhydrAMINE (BENADRYL) 25 mg capsule Take 25 mg by mouth every 6 (six) hours as needed.   EPINEPHrine (AUVI-Q) 0.3 mg/0.3 mL IJ SOAJ injection Inject 0.3 mg into the muscle as needed for anaphylaxis.   EPINEPHrine 0.3 mg/0.3 mL IJ SOAJ injection Inject 0.3 mg into the muscle as needed for anaphylaxis.   ergocalciferol (VITAMIN D2) 1.25 MG (50000 UT) capsule Take 50,000 Units by mouth once a week.   EUCRISA 2 % OINT Apply 1 application topically 2 (two) times daily.   ferrous sulfate 325 (65 FE) MG tablet Take 325  mg by mouth daily with breakfast.   hydrochlorothiazide (HYDRODIURIL) 25 MG tablet Take 1 tablet (25 mg total) by mouth daily.   ipratropium-albuterol (DUONEB) 0.5-2.5 (3) MG/3ML SOLN Take 3 mLs by nebulization every 6 (six) hours as needed. (Patient not taking: Reported on 03/23/2022)   levocetirizine (XYZAL) 5 MG tablet TAKE 1 TABLET BY MOUTH TWICE A DAY   montelukast (SINGULAIR) 10 MG tablet Take 1 tablet (10 mg total) by mouth at bedtime.   Multiple Vitamins-Minerals (BARIATRIC MULTIVITAMINS/IRON PO) Take 1 tablet by mouth.   ondansetron (ZOFRAN-ODT)  4 MG disintegrating tablet TAKE 1-2 TABLETS (4-8 MG TOTAL) BY MOUTH EVERY 8 (EIGHT) HOURS AS NEEDED FOR NAUSEA. APPOINTMENT NEEDED FOR FURTHER REFILLS. *INS LIMIT   propranolol (INDERAL) 10 MG tablet Take 10 mg by mouth as needed.   rizatriptan (MAXALT-MLT) 10 MG disintegrating tablet TAKE 1 TABLET BY MOUTH AS NEEDED FOR MIGRAINE. MAY REPEAT IN 2 HOURS IF NEEDED   tamoxifen (NOLVADEX) 20 MG tablet Take 50 mg by mouth daily.    triamcinolone ointment (KENALOG) 0.1 % Apply 1 application topically 2 (two) times daily.   valsartan (DIOVAN) 40 MG tablet Take 1 tablet (40 mg total) by mouth daily.   venlafaxine (EFFEXOR) 100 MG tablet Take 100 mg by mouth 2 (two) times daily.   verapamil (CALAN) 80 MG tablet TAKE 1 TABLET (80 MG TOTAL) BY MOUTH 3 (THREE) TIMES DAILY.   No facility-administered encounter medications on file as of 09/17/2022.      Observations/Objective:    09/17/2022    8:31 AM 08/02/2022    3:14 PM 06/04/2022   10:41 AM  Vitals with BMI  Height '5\' 5"'$  '5\' 5"'$  '5\' 5"'$   Weight 228 lbs 226 lbs 227 lbs 3 oz  BMI 37.94 10.25 85.27  Systolic  782 423  Diastolic  83 83  Pulse  96 39  Middle-age African-American female sitting in chair in NAD.   Assessment and Plan: 1. Class 2 severe obesity with serious comorbidity and body mass index (BMI) of 37.0 to 37.9 in adult, unspecified obesity type ([place]) -Amended her on healthy eating and regular exercise.  Encouraged her to continue doing so. We discussed putting her on Wegovy but also spoke about challenges of supply and demand.  Issues getting the 0.5 mg dose at times.  Other issues is that insurance sometimes do not want to pay for these medications due to cost. Went over possible side effects of the medication including nausea, vomiting, abdominal pain, bowel blockage, diarrhea and palpitations.  Advised to stop the medication and give me a call if she develops any vomiting or pain in the upper abdomen.  No history of thyroid disease.  We  will start her on Wegovy 0.25 mg once a week.  Advised to have the pharmacist show her how to administer the injection.  Follow-up with me in 1 month to see how she is doing. - Semaglutide-Weight Management (WEGOVY) 0.25 MG/0.5ML SOAJ; Inject 0.25 mg into the skin once a week.  Dispense: 2 mL; Refill: 1   Follow Up Instructions: 1 mth.  Message sent to admin pool for scheduling.   I discussed the assessment and treatment plan with the patient. The patient was provided an opportunity to ask questions and all were answered. The patient agreed with the plan and demonstrated an understanding of the instructions.   The patient was advised to call back or seek an in-person evaluation if the symptoms worsen or if the condition fails to improve as  anticipated.  I spent 19 minutes dedicated to the care of this patient on the date of this encounter to include previsit review of chart, face-to-face time with patient discussing diagnosis and management and postvisit entering of orders.  This note has been created with Surveyor, quantity. Any transcriptional errors are unintentional.  [name], MD

## 2022-09-17 NOTE — Telephone Encounter (Signed)
Requested Prescriptions  Pending Prescriptions Disp Refills   montelukast (SINGULAIR) 10 MG tablet [Pharmacy Med Name: MONTELUKAST SOD 10 MG TABLET] 90 tablet 1    Sig: TAKE 1 TABLET BY MOUTH EVERYDAY AT BEDTIME     Pulmonology:  Leukotriene Inhibitors Passed - 09/17/2022  1:53 AM      Passed - Valid encounter within last 12 months    Recent Outpatient Visits           Today Class 2 severe obesity with serious comorbidity and body mass index (BMI) of 37.0 to 37.9 in adult, unspecified obesity type [facility])   [place] Ladell Pier, MD   1 month ago Viral respiratory illness   [place], MD   3 months ago Essential hypertension   [place], MD   5 months ago Essential hypertension   [place], Deborah B, MD   1 year ago Annual physical exam   [place], MD       Future Appointments             In 2 weeks Ladell Pier, MD [place]             hydrochlorothiazide (HYDRODIURIL) 25 MG tablet [Pharmacy Med Name: HYDROCHLOROTHIAZIDE 25 MG TAB] 90 tablet 1    Sig: TAKE 1 TABLET (25 MG TOTAL) BY MOUTH DAILY.     Cardiovascular: Diuretics - Thiazide Passed - 09/17/2022  1:53 AM      Passed - Cr in normal range and within 180 days    Creatinine, Ser  Date Value Ref Range Status  06/04/2022 0.78 0.57 - 1.00 mg/dL Final         Passed - K in normal range and within 180 days    Potassium  Date Value Ref Range Status  06/04/2022 4.0 3.5 - 5.2 mmol/L Final         Passed - Na in normal range and within 180 days    Sodium  Date Value Ref Range Status  06/04/2022 141 134 - 144 mmol/L Final         Passed - Last BP in normal range    BP Readings from Last 1 Encounters:  08/02/22 114/83          Passed - Valid encounter within last 6 months    Recent Outpatient Visits           Today Class 2 severe obesity with serious comorbidity and body mass index (BMI) of 37.0 to 37.9 in adult, unspecified obesity type [facility])   [place] Ladell Pier, MD   1 month ago Viral respiratory illness   [place], MD   3 months ago Essential hypertension   [place], MD   5 months ago Essential hypertension   [place] Ladell Pier, MD   1 year ago Annual physical exam   [place], MD       Future Appointments             In 2 weeks Ladell Pier, MD [facility]  [place]

## 2022-09-22 ENCOUNTER — Encounter: Payer: Self-pay | Admitting: Internal Medicine

## 2022-09-22 ENCOUNTER — Other Ambulatory Visit: Payer: Self-pay

## 2022-09-23 ENCOUNTER — Other Ambulatory Visit: Payer: Self-pay | Admitting: Internal Medicine

## 2022-09-24 ENCOUNTER — Other Ambulatory Visit: Payer: Self-pay

## 2022-10-01 ENCOUNTER — Other Ambulatory Visit: Payer: Self-pay | Admitting: Internal Medicine

## 2022-10-05 ENCOUNTER — Encounter (HOSPITAL_BASED_OUTPATIENT_CLINIC_OR_DEPARTMENT_OTHER): Payer: Managed Care, Other (non HMO) | Admitting: Internal Medicine

## 2022-10-05 ENCOUNTER — Encounter: Payer: Self-pay | Admitting: Internal Medicine

## 2022-10-05 ENCOUNTER — Ambulatory Visit: Payer: Managed Care, Other (non HMO)

## 2022-10-05 DIAGNOSIS — M7122 Synovial cyst of popliteal space [Baker], left knee: Secondary | ICD-10-CM

## 2022-10-07 ENCOUNTER — Ambulatory Visit: Payer: Managed Care, Other (non HMO) | Admitting: Internal Medicine

## 2022-10-07 NOTE — Telephone Encounter (Signed)

## 2022-10-15 ENCOUNTER — Encounter: Payer: Self-pay | Admitting: Orthopaedic Surgery

## 2022-10-15 ENCOUNTER — Ambulatory Visit: Payer: Self-pay

## 2022-10-15 ENCOUNTER — Ambulatory Visit (INDEPENDENT_AMBULATORY_CARE_PROVIDER_SITE_OTHER): Payer: Managed Care, Other (non HMO) | Admitting: Orthopaedic Surgery

## 2022-10-15 ENCOUNTER — Ambulatory Visit (INDEPENDENT_AMBULATORY_CARE_PROVIDER_SITE_OTHER): Payer: Managed Care, Other (non HMO)

## 2022-10-15 DIAGNOSIS — M25532 Pain in left wrist: Secondary | ICD-10-CM
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
## 2022-10-20 ENCOUNTER — Encounter: Payer: Self-pay | Admitting: Internal Medicine

## 2022-10-20 ENCOUNTER — Other Ambulatory Visit: Payer: Self-pay | Admitting: Internal Medicine

## 2022-10-20 DIAGNOSIS — Z9189 Other specified personal risk factors, not elsewhere classified: Secondary | ICD-10-CM | POA: Insufficient documentation

## 2022-10-20 MED ORDER — EUCRISA 2 % EX OINT: 1.0000 "application " | TOPICAL_OINTMENT | Freq: Two times a day (BID) | CUTANEOUS | 1 refills | Status: DC

## 2022-10-23 ENCOUNTER — Ambulatory Visit
Admission: RE | Admit: 2022-10-23 | Discharge: 2022-10-23 | Disposition: A | Discharge status: Home / Self Care | Payer: Managed Care, Other (non HMO) | Source: Ambulatory Visit | Attending: Orthopaedic Surgery | Admitting: Orthopaedic Surgery

## 2022-10-23 ENCOUNTER — Other Ambulatory Visit: Payer: Managed Care, Other (non HMO)

## 2022-10-23 DIAGNOSIS — M25532 Pain in left wrist: Secondary | ICD-10-CM

## 2022-10-23 MED ORDER — GADOPICLENOL 0.5 MMOL/ML IV SOLN
10.0000 mL | Freq: Once | INTRAVENOUS | Status: AC | PRN
  Administered 2022-10-23: 10 mL via INTRAVENOUS

## 2022-10-25 ENCOUNTER — Encounter: Payer: Self-pay | Admitting: Internal Medicine

## 2022-10-25 ENCOUNTER — Telehealth: Payer: Self-pay

## 2022-10-25 ENCOUNTER — Other Ambulatory Visit: Payer: Self-pay

## 2022-10-25 NOTE — Telephone Encounter (Signed)
Prior authorzation for Carla Buck has been approved: [id number];Review Type:Prior Auth;Coverage Start Date:10/25/2022;Coverage End Date:08/15/2098

## 2022-10-26 ENCOUNTER — Encounter: Payer: Self-pay | Admitting: Internal Medicine

## 2022-10-26 ENCOUNTER — Ambulatory Visit: Payer: Managed Care, Other (non HMO) | Attending: Internal Medicine | Admitting: Internal Medicine

## 2022-10-26 VITALS — BP 113/78 | HR 79 | Temp 98.2°F | Ht 65.0 in | Wt 229.0 lb

## 2022-10-26 DIAGNOSIS — I10 Essential (primary) hypertension: Secondary | ICD-10-CM

## 2022-10-26 DIAGNOSIS — Z6838 Body mass index (BMI) 38.0-38.9, adult: Secondary | ICD-10-CM

## 2022-10-26 MED ORDER — VALSARTAN 40 MG PO TABS: 40.0000 mg | ORAL_TABLET | Freq: Every day | ORAL | 3 refills | Status: DC
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
## 2022-10-27 ENCOUNTER — Encounter: Payer: Self-pay | Admitting: Orthopaedic Surgery

## 2022-10-27 ENCOUNTER — Ambulatory Visit (INDEPENDENT_AMBULATORY_CARE_PROVIDER_SITE_OTHER): Payer: Managed Care, Other (non HMO) | Admitting: Orthopaedic Surgery

## 2022-10-27 DIAGNOSIS — M67432 Ganglion, left wrist: Secondary | ICD-10-CM

## 2022-10-27 HISTORY — DX: Ganglion, left wrist: M67.432
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
## 2022-11-01 ENCOUNTER — Ambulatory Visit: Payer: Managed Care, Other (non HMO) | Admitting: Internal Medicine

## 2022-11-01 ENCOUNTER — Encounter: Payer: Self-pay | Admitting: Internal Medicine

## 2022-11-02 ENCOUNTER — Other Ambulatory Visit: Payer: Self-pay | Admitting: Internal Medicine

## 2022-11-02 DIAGNOSIS — N898 Other specified noninflammatory disorders of vagina: Secondary | ICD-10-CM

## 2022-11-02 DIAGNOSIS — E8940 Asymptomatic postprocedural ovarian failure: Secondary | ICD-10-CM

## 2022-11-08 ENCOUNTER — Encounter: Payer: Self-pay | Admitting: Orthopaedic Surgery

## 2022-11-08 ENCOUNTER — Encounter: Payer: Self-pay | Admitting: Internal Medicine

## 2022-11-09 NOTE — Telephone Encounter (Signed)
noted 

## 2022-11-15 HISTORY — PX: OTHER SURGICAL HISTORY: SHX169

## 2022-11-22 ENCOUNTER — Telehealth: Payer: Self-pay | Admitting: Orthopaedic Surgery

## 2022-11-22 NOTE — Telephone Encounter (Signed)
Thanks

## 2022-11-22 NOTE — Telephone Encounter (Signed)
Patient called to cancel left ganglion cyst removal for the wrist.  She was scheduled at [facility] on 12-02-22 but is unable to come up with $800+ upfront on the day of surgery.  Patient has pushed the surgery back to 03-17-23 at [facility].

## 2022-11-30 ENCOUNTER — Telehealth: Payer: Managed Care, Other (non HMO) | Admitting: Physician Assistant

## 2022-11-30 DIAGNOSIS — K5901 Slow transit constipation: Secondary | ICD-10-CM | POA: Diagnosis not present

## 2022-11-30 MED ORDER — TRULANCE 3 MG PO TABS: 1.0000 | ORAL_TABLET | Freq: Every day | ORAL | 0 refills | Status: AC

## 2022-11-30 NOTE — Patient Instructions (Signed)
Carla Buck, thank you for joining Margaretann Loveless, PA-C for today's virtual visit.  While this provider is not your primary care provider (PCP), if your PCP is located in our provider database this encounter information will be shared with them immediately following your visit.   A [facility] MyChart account gives you access to today's visit and all your visits, tests, and labs performed at [facility] " click here if you don't have a [facility] MyChart account or go to [email or web address]  Consent: (Patient) Carla Buck provided verbal consent for this virtual visit at the beginning of the encounter.  Current Medications:  Current Outpatient Medications:    acyclovir (ZOVIRAX) 400 MG tablet, TAKE 1 TABLET BY MOUTH TWICE A DAY, Disp: 180 tablet, Rfl: 0   albuterol (VENTOLIN HFA) 108 (90 Base) MCG/ACT inhaler, INHALE 2 PUFFS EVERY 4-6 HOURS AS NEEDED FOR COUGH, WHEEZE, TIGHTNESS IN CHEST, SHORTNESS OF BREATH, Disp: 18 g, Rfl: 1   Azelastine HCl 0.15 % SOLN, SPRAY 1 TO 2 SPRAYS EACH NOSTRIL 1-2 TIMES A DAY AS NEEDED FOR RUNNY NOSE/DRAINAGE DOWN THROAT, Disp: 30 mL, Rfl: 3   azithromycin (ZITHROMAX Z-PAK) 250 MG tablet, 2 tabs PO x 1 then 1 tab daily, Disp: 6 each, Rfl: 0   busPIRone (BUSPAR) 5 MG tablet, Take 5 mg by mouth in the morning and at bedtime., Disp: , Rfl:    diphenhydrAMINE (BENADRYL) 25 mg capsule, Take 25 mg by mouth every 6 (six) hours as needed., Disp: , Rfl:    EPINEPHrine (AUVI-Q) 0.3 mg/0.3 mL IJ SOAJ injection, Inject 0.3 mg into the muscle as needed for anaphylaxis., Disp: 1 each, Rfl: 1   EPINEPHrine 0.3 mg/0.3 mL IJ SOAJ injection, Inject 0.3 mg into the muscle as needed for anaphylaxis., Disp: 1 each, Rfl: 0   ergocalciferol (VITAMIN D2) 1.25 MG (50000 UT) capsule, Take 50,000 Units by mouth once a week., Disp: , Rfl:    EUCRISA 2 % OINT, Apply 1 application  topically 2 (two) times daily., Disp: 100 g, Rfl: 1   ferrous  sulfate 325 (65 FE) MG tablet, Take 325 mg by mouth daily with breakfast., Disp: , Rfl:    hydrochlorothiazide (HYDRODIURIL) 25 MG tablet, TAKE 1 TABLET (25 MG TOTAL) BY MOUTH DAILY., Disp: 90 tablet, Rfl: 1   ipratropium-albuterol (DUONEB) 0.5-2.5 (3) MG/3ML SOLN, Take 3 mLs by nebulization every 6 (six) hours as needed., Disp: , Rfl:    levocetirizine (XYZAL) 5 MG tablet, TAKE 1 TABLET BY MOUTH TWICE A DAY, Disp: 30 tablet, Rfl: 0   montelukast (SINGULAIR) 10 MG tablet, TAKE 1 TABLET BY MOUTH EVERYDAY AT BEDTIME, Disp: 90 tablet, Rfl: 1   Multiple Vitamins-Minerals (BARIATRIC MULTIVITAMINS/IRON PO), Take 1 tablet by mouth., Disp: , Rfl:    ondansetron (ZOFRAN-ODT) 4 MG disintegrating tablet, TAKE 1-2 TABLETS (4-8 MG TOTAL) BY MOUTH EVERY 8 (EIGHT) HOURS AS NEEDED FOR NAUSEA. APPOINTMENT NEEDED FOR FURTHER REFILLS. *INS LIMIT, Disp: 18 tablet, Rfl: 1   Plecanatide (TRULANCE) 3 MG TABS, Take 1 tablet (3 mg total) by mouth daily., Disp: 30 tablet, Rfl: 0   propranolol (INDERAL) 10 MG tablet, Take 10 mg by mouth as needed., Disp: , Rfl:    rizatriptan (MAXALT-MLT) 10 MG disintegrating tablet, TAKE 1 TABLET BY MOUTH AS NEEDED FOR MIGRAINE. MAY REPEAT IN 2 HOURS IF NEEDED, Disp: 9 tablet, Rfl: 1   triamcinolone ointment (KENALOG) 0.1 %, Apply 1 application topically 2 (two) times daily., Disp: 454  g, Rfl: 5   valsartan (DIOVAN) 40 MG tablet, Take 1 tablet (40 mg total) by mouth daily., Disp: 90 tablet, Rfl: 3   venlafaxine XR (EFFEXOR-XR) 150 MG 24 hr capsule, Take 150 mg by mouth every morning., Disp: , Rfl:    venlafaxine XR (EFFEXOR-XR) 75 MG 24 hr capsule, Take 75 mg by mouth every evening., Disp: , Rfl:    verapamil (CALAN) 80 MG tablet, TAKE 1 TABLET (80 MG TOTAL) BY MOUTH 3 (THREE) TIMES DAILY., Disp: 180 tablet, Rfl: 0   Medications ordered in this encounter:  Meds ordered this encounter  Medications   Plecanatide (TRULANCE) 3 MG TABS    Sig: Take 1 tablet (3 mg total) by mouth daily.     Dispense:  30 tablet    Refill:  0    Order Specific Question:   Supervising Provider    Answer:   Merrilee Jansky [id number]     *If you need refills on other medications prior to your next appointment, please contact your pharmacy*  Follow-Up: Call back or seek an in-person evaluation if the symptoms worsen or if the condition fails to improve as anticipated.  [facility] [phone]  Other Instructions  Plecanatide Tablets What is this medication? PLECANATIDE (ple KAN a tide) treats irritable bowel syndrome (IBS) with constipation. It may also treat chronic constipation. It works by softening the stool, making it easier to have a bowel movement. This medicine may be used for other purposes; ask your health care provider or pharmacist if you have questions. COMMON BRAND NAME(S): Trulance What should I tell my care team before I take this medication? They need to know if you have any of these conditions: Now have diarrhea or have diarrhea often Stomach or intestinal disease, including bowel obstruction or abdominal adhesions An unusual or allergic reaction to plecanatide, other medications, foods, dyes, or preservatives Pregnant or trying to get pregnant Breast-feeding How should I use this medication? Take this medication by mouth with a full glass of water. Take it as directed on the prescription label at the same time every day. Do not cut, crush or chew this medication. Swallow tablets whole. You can take it with or without food. If it upsets your stomach, take it with food. Keep taking it unless your care team tells you to stop. A special MedGuide will be given to you by the pharmacist with each prescription and refill. Be sure to read this information carefully each time. Talk to your care team about the use of this medication in children. It is not approved for use in children. Overdosage: If you think you have taken too much of this medicine contact a poison  control center or emergency room at once. NOTE: This medicine is only for you. Do not share this medicine with others. What if I miss a dose? If you miss a dose, skip it. Take your next dose at the normal time. Do not take extra or 2 doses at the same time to make up for the missed dose. What may interact with this medication? Certain medications for bowel problems or bladder incontinence (these can cause constipation) This list may not describe all possible interactions. Give your health care provider a list of all the medicines, herbs, non-prescription drugs, or dietary supplements you use. Also tell them if you smoke, drink alcohol, or use illegal drugs. Some items may interact with your medicine. What should I watch for while using this medication? Visit your care  team for regular checks on your progress. Tell your care team if your symptoms do not get better or if they get worse. Diarrhea is a common side effect of this medication. It often begins within 2 weeks of starting this medication. Stop taking this medication and call your care team if you get severe diarrhea. What side effects may I notice from receiving this medication? Side effects that you should report to your care team as soon as possible: Allergic reactions--skin rash, itching, hives, swelling of the face, lips, tongue, or throat Side effects that usually do not require medical attention (Report these to your care team if they continue or are bothersome): Diarrhea Dizziness Nausea Sore throat Runny or stuffy nose This list may not describe all possible side effects. Call your doctor for medical advice about side effects. You may report side effects to FDA at 1-800-FDA-1088. Where should I keep my medication? Keep out of the reach of children and pets. Store at room temperature between 15 and 30 degrees C (59 and 86 degrees F). Keep this medication in the original container. Protect from moisture. Keep container tightly  closed. Do not throw out the packet in the container. It keeps the medication dry. Get rid of any unused medication after the expiration date. To get rid of medications that are no longer needed or have expired: Take the medications to a medication take-back program. Check with your pharmacy or law enforcement to find a location. If your cannot return the medication, check the label or package insert to see if the medication should be thrown out in the garbage or flushed down the toilet. If you are not sure, ask your care team. If it is safe to put it in the trash, take the medication out of the container. Mix the medication with cat litter, dirt, coffee grounds, or other unwanted substance. Seal the mixture in a bag or container. Put it in the trash. NOTE: This sheet is a summary. It may not cover all possible information. If you have questions about this medicine, talk to your doctor, pharmacist, or health care provider.  2023 Elsevier/Gold Standard (2021-05-12 00:00:00)    If you have been instructed to have an in-person evaluation today at a local Urgent Care facility, please use the link below. It will take you to a list of all of our available [facility], including address, phone number and hours of operation. Please do not delay care.  [facility]  If you or a family member do not have a primary care provider, use the link below to schedule a visit and establish care. When you choose a [facility] primary care physician or advanced practice provider, you gain a long-term partner in health. Find a Primary Care Provider  Learn more about 's in-office and virtual care options:  - Get Care Now
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
## 2022-12-01 ENCOUNTER — Encounter (HOSPITAL_BASED_OUTPATIENT_CLINIC_OR_DEPARTMENT_OTHER): Payer: Self-pay

## 2022-12-01 ENCOUNTER — Emergency Department (HOSPITAL_BASED_OUTPATIENT_CLINIC_OR_DEPARTMENT_OTHER): Payer: Managed Care, Other (non HMO)

## 2022-12-01 ENCOUNTER — Emergency Department (HOSPITAL_BASED_OUTPATIENT_CLINIC_OR_DEPARTMENT_OTHER)
Admission: EM | Admit: 2022-12-01 | Discharge: 2022-12-01 | Disposition: A | Discharge status: Home / Self Care | Payer: Managed Care, Other (non HMO) | Attending: Emergency Medicine | Admitting: Emergency Medicine

## 2022-12-01 ENCOUNTER — Other Ambulatory Visit: Payer: Self-pay

## 2022-12-01 DIAGNOSIS — R110 Nausea: Secondary | ICD-10-CM | POA: Insufficient documentation

## 2022-12-01 DIAGNOSIS — K5900 Constipation, unspecified: Secondary | ICD-10-CM | POA: Insufficient documentation

## 2022-12-01 DIAGNOSIS — R109 Unspecified abdominal pain: Secondary | ICD-10-CM | POA: Insufficient documentation

## 2022-12-01 LAB — COMPREHENSIVE METABOLIC PANEL
ALT: 20 U/L (ref 0–44)
AST: 23 U/L (ref 15–41)
Albumin: 4.3 g/dL (ref 3.5–5.0)
Alkaline Phosphatase: 56 U/L (ref 38–126)
Anion gap: 8 (ref 5–15)
BUN: 14 mg/dL (ref 6–20)
CO2: 30 mmol/L (ref 22–32)
Calcium: 9.3 mg/dL (ref 8.9–10.3)
Chloride: 100 mmol/L (ref 98–111)
Creatinine, Ser: 0.88 mg/dL (ref 0.44–1.00)
GFR, Estimated: >60 mL/min (ref >60)
Glucose, Bld: 79 mg/dL (ref 70–99)
Potassium: 3.5 mmol/L (ref 3.5–5.1)
Sodium: 138 mmol/L (ref 135–145)
Total Bilirubin: 0.3 mg/dL (ref 0.3–1.2)
Total Protein: 7.1 g/dL (ref 6.5–8.1)

## 2022-12-01 LAB — CBC
HCT: 38.3 % (ref 36.0–46.0)
Hemoglobin: 12.6 g/dL (ref 12.0–15.0)
MCH: 30.7 pg (ref 26.0–34.0)
MCHC: 32.9 g/dL (ref 30.0–36.0)
MCV: 93.4 fL (ref 80.0–100.0)
Platelets: 274 10*3/uL (ref 150–400)
RBC: 4.1 MIL/uL (ref 3.87–5.11)
RDW: 14.3 % (ref 11.5–15.5)
WBC: 5.8 10*3/uL (ref 4.0–10.5)
nRBC: 0 % (ref 0.0–0.2)

## 2022-12-01 LAB — URINALYSIS, ROUTINE W REFLEX MICROSCOPIC
Bilirubin Urine: NEGATIVE
Glucose, UA: NEGATIVE mg/dL
Hgb urine dipstick: NEGATIVE
Ketones, ur: NEGATIVE mg/dL
Leukocytes,Ua: NEGATIVE
Nitrite: NEGATIVE
Specific Gravity, Urine: 1.024 (ref 1.005–1.030)
pH: 7.5 (ref 5.0–8.0)

## 2022-12-01 LAB — LIPASE, BLOOD: Lipase: 37 U/L (ref 11–51)

## 2022-12-01 MED ORDER — FLEET ENEMA 7-19 GM/118ML RE ENEM: 1.0000 | ENEMA | Freq: Once | RECTAL | Status: DC

## 2022-12-01 MED ORDER — IOHEXOL 300 MG/ML  SOLN
100.0000 mL | Freq: Once | INTRAMUSCULAR | Status: AC | PRN
  Administered 2022-12-01: 100 mL via INTRAVENOUS

## 2022-12-01 MED ORDER — PEG-3350/ELECTROLYTES 236 G PO SOLR: 1.0000 [IU] | Freq: Once | ORAL | 0 refills | Status: AC

## 2022-12-01 MED ORDER — FLEET ENEMA 7-19 GM/118ML RE ENEM
1.0000 | ENEMA | Freq: Once | RECTAL | Status: AC
  Administered 2022-12-01: 1 via RECTAL
  Filled 2022-12-01: qty 1

## 2022-12-01 NOTE — Discharge Instructions (Signed)
I have sent your Alprep to the pharmacy, you can drink the whole container, to help clean yourself out.  I suggest trying to drink as much as you can, to help relieve your constipation.  I also recommend that you be on a daily bowel/laxative, MiraLAX 17 g daily, after finishing the bowel prep to help keep your bowels regular.  Follow-up with your primary care doctor, return to the ER if you have severe abdominal pain intractable nausea or vomiting.

## 2022-12-01 NOTE — ED Triage Notes (Signed)
Patient arrives to ED POV C/O Abdominal Pain and constipation. Per Pt she was told by her PCP to come to ED due to Possible "Bowel blockage". Pt has been constipated and abd pain x9 days. Hx of Bariatric surgery.

## 2022-12-01 NOTE — ED Notes (Signed)
Provided enema using foley and inflated balloon technique  with FLEET enema as instructed by ED PA. No complication or pain during procedure, assisted by Tia, NT. Pt able to retain fluid with assistance of foley balloon for before feeling cramping and urge to defecate. Foley removed and pt now sitting on toilet with feet propped up stool to create squatting posture.

## 2022-12-01 NOTE — ED Provider Notes (Signed)
EMERGENCY DEPARTMENT AT [facility] Provider Note   CSN: [id number] Arrival date & time: 12/01/22  1717     History  Chief Complaint  Patient presents with   Abdominal Pain    Carla Buck - Delories Heinz is a 42 y.o. female, history of gastric Roux-en-Y, hysterectomy, oophorectomy, who presents to the ED secondary to no bowel movement for the last 9 days.  She states that she has been having difficult time having bowel movements for the last 9 days has tried multiple enemas, back pain and bowel preps, without relief.  States she called her primary care doctor, and she tried laxative meds, without any relief, and was told to go to the ER for possible bowel obstruction.  She has never had a bowel obstruction, but states she is not passing much gas and she has some nausea.     Home Medications Prior to Admission medications   Medication Sig Start Date End Date Taking? Authorizing Provider  PEG 3350-KCl-NaBcb-NaCl-NaSulf (PEG-3350/ELECTROLYTES) 236 g SOLR Take 1 Units by mouth once for 1 dose. 12/01/22 12/01/22 Yes Jaquail Mclees L, PA  acyclovir (ZOVIRAX) 400 MG tablet TAKE 1 TABLET BY MOUTH TWICE A DAY 10/01/22   Marcine Matar, MD  albuterol (VENTOLIN HFA) 108 (90 Base) MCG/ACT inhaler INHALE 2 PUFFS EVERY 4-6 HOURS AS NEEDED FOR COUGH, WHEEZE, TIGHTNESS IN CHEST, SHORTNESS OF BREATH 05/25/21   Nehemiah Settle, FNP  Azelastine HCl 0.15 % SOLN SPRAY 1 TO 2 SPRAYS EACH NOSTRIL 1-2 TIMES A DAY AS NEEDED FOR RUNNY NOSE/DRAINAGE DOWN THROAT 03/27/21   Ellamae Sia, DO  azithromycin (ZITHROMAX Z-PAK) 250 MG tablet 2 tabs PO x 1 then 1 tab daily 08/02/22   Marcine Matar, MD  busPIRone (BUSPAR) 5 MG tablet Take 5 mg by mouth in the morning and at bedtime. 02/11/22   [provider]  diphenhydrAMINE (BENADRYL) 25 mg capsule Take 25 mg by mouth every 6 (six) hours as needed.    [provider]  EPINEPHrine (AUVI-Q) 0.3 mg/0.3 mL IJ SOAJ injection Inject 0.3 mg  into the muscle as needed for anaphylaxis. 12/29/20   Nehemiah Settle, FNP  EPINEPHrine 0.3 mg/0.3 mL IJ SOAJ injection Inject 0.3 mg into the muscle as needed for anaphylaxis. 01/25/22   Janell Quiet, PA-C  ergocalciferol (VITAMIN D2) 1.25 MG (50000 UT) capsule Take 50,000 Units by mouth once a week.    [provider]  EUCRISA 2 % OINT Apply 1 application  topically 2 (two) times daily. 10/20/22   Marcine Matar, MD  ferrous sulfate 325 (65 FE) MG tablet Take 325 mg by mouth daily with breakfast.    [provider]  hydrochlorothiazide (HYDRODIURIL) 25 MG tablet TAKE 1 TABLET (25 MG TOTAL) BY MOUTH DAILY. 09/17/22   Marcine Matar, MD  ipratropium-albuterol (DUONEB) 0.5-2.5 (3) MG/3ML SOLN Take 3 mLs by nebulization every 6 (six) hours as needed.    [provider]  levocetirizine (XYZAL) 5 MG tablet TAKE 1 TABLET BY MOUTH TWICE A DAY 03/15/22   Ellamae Sia, DO  montelukast (SINGULAIR) 10 MG tablet TAKE 1 TABLET BY MOUTH EVERYDAY AT BEDTIME 09/17/22   Marcine Matar, MD  Multiple Vitamins-Minerals (BARIATRIC MULTIVITAMINS/IRON PO) Take 1 tablet by mouth.    [provider]  ondansetron (ZOFRAN-ODT) 4 MG disintegrating tablet TAKE 1-2 TABLETS (4-8 MG TOTAL) BY MOUTH EVERY 8 (EIGHT) HOURS AS NEEDED FOR NAUSEA. APPOINTMENT NEEDED FOR FURTHER REFILLS. *INS LIMIT 07/28/21  Anson Fret, MD  Plecanatide (TRULANCE) 3 MG TABS Take 1 tablet (3 mg total) by mouth daily. 11/30/22   Margaretann Loveless, PA-C  propranolol (INDERAL) 10 MG tablet Take 10 mg by mouth as needed.    [provider]  rizatriptan (MAXALT-MLT) 10 MG disintegrating tablet TAKE 1 TABLET BY MOUTH AS NEEDED FOR MIGRAINE. MAY REPEAT IN 2 HOURS IF NEEDED 09/14/21   Anson Fret, MD  triamcinolone ointment (KENALOG) 0.1 % Apply 1 application topically 2 (two) times daily. 07/30/19   Bobbitt, Heywood Iles, MD  valsartan (DIOVAN) 40 MG tablet Take 1 tablet (40 mg total) by mouth daily.  10/26/22   Marcine Matar, MD  venlafaxine XR (EFFEXOR-XR) 150 MG 24 hr capsule Take 150 mg by mouth every morning. 09/08/22   [provider]  venlafaxine XR (EFFEXOR-XR) 75 MG 24 hr capsule Take 75 mg by mouth every evening.    [provider]  verapamil (CALAN) 80 MG tablet TAKE 1 TABLET (80 MG TOTAL) BY MOUTH 3 (THREE) TIMES DAILY. 09/16/21   Anson Fret, MD      Allergies    Bee venom, Peanut-containing drug products, Pollen extract, Shellfish allergy, Wasp venom, Xolair [omalizumab], Amoxicillin, Bug itch releaf [misc natural products], Contrave [naltrexone-bupropion hcl er], Corn-containing products, Dust mite extract, Gluten meal, Iodine, Lidocaine, Orange fruit [citrus], Penicillins, Sesame oil, Soy allergy, Tetracaine, and Tomato    Review of Systems   Review of Systems  Gastrointestinal:  Positive for abdominal pain and nausea. Negative for vomiting.    Physical Exam Updated Vital Signs BP 124/88 (BP Location: Right Arm)   Pulse 68   Temp 98.4 F (36.9 C) (Oral)   Resp 16   Ht  (1.651 m)   Wt 104.3 kg   SpO2 100%   BMI 38.27 kg/m  Physical Exam Vitals and nursing note reviewed.  Constitutional:      General: She is not in acute distress.    Appearance: She is well-developed.  HENT:     Head: Normocephalic and atraumatic.  Eyes:     Conjunctiva/sclera: Conjunctivae normal.  Cardiovascular:     Rate and Rhythm: Normal rate and regular rhythm.     Heart sounds: No murmur heard. Pulmonary:     Effort: Pulmonary effort is normal. No respiratory distress.     Breath sounds: Normal breath sounds.  Abdominal:     General: There is distension.     Palpations: Abdomen is soft.     Tenderness: There is generalized abdominal tenderness.  Musculoskeletal:        General: No swelling.     Cervical back: Neck supple.  Skin:    General: Skin is warm and dry.     Capillary Refill: Capillary refill takes less than 2 seconds.  Neurological:      Mental Status: She is alert.  Psychiatric:        Mood and Affect: Mood normal.     ED Results / Procedures / Treatments   Labs (all labs ordered are listed, but only abnormal results are displayed) Labs Reviewed  URINALYSIS, ROUTINE W REFLEX MICROSCOPIC - Abnormal; Notable for the following components:      Result Value   Protein, ur TRACE (*)    All other components within normal limits  LIPASE, BLOOD  COMPREHENSIVE METABOLIC PANEL  CBC    EKG None  Radiology CT ABDOMEN PELVIS W CONTRAST  Result Date: 12/01/2022 CLINICAL DATA:  Concern for bowel obstruction. EXAM:  CT ABDOMEN AND PELVIS WITH CONTRAST TECHNIQUE: Multidetector CT imaging of the abdomen and pelvis was performed using the standard protocol following bolus administration of intravenous contrast. RADIATION DOSE REDUCTION: This exam was performed according to the departmental dose-optimization program which includes automated exposure control, adjustment of the mA and/or kV according to patient size and/or use of iterative reconstruction technique. CONTRAST:  OMNIPAQUE IOHEXOL 300 MG/ML  SOLN COMPARISON:  None Available. FINDINGS: Lower chest: The visualized lung bases are clear. No intra-abdominal free air or free fluid. Hepatobiliary: No focal liver abnormality is seen. No gallstones, gallbladder wall thickening, or biliary dilatation. Pancreas: Unremarkable. No pancreatic ductal dilatation or surrounding inflammatory changes. Spleen: Normal in size without focal abnormality. Adrenals/Urinary Tract: Adrenal glands are unremarkable. Kidneys are normal, without renal calculi, focal lesion, or hydronephrosis. Bladder is unremarkable. Stomach/Bowel: Postsurgical changes of the bowel with multiple anastomotic sutures. There is no bowel obstruction or active inflammation. The appendix is normal. Vascular/Lymphatic: The abdominal aorta and IVC unremarkable. No portal venous gas. There is no adenopathy. Reproductive: Hysterectomy.   No adnexal masses. Other: None Musculoskeletal: No acute or significant osseous findings. IMPRESSION: No acute intra-abdominal or pelvic pathology. No bowel obstruction. Normal appendix. Electronically Signed   By: Elgie Collard M.D.   On: 12/01/2022 19:39    Procedures Procedures    Medications Ordered in ED Medications  iohexol (OMNIPAQUE) 300 MG/ML solution 100 mL (100 mLs Intravenous Contrast Given 12/01/22 1913)  sodium phosphate (FLEET) 7-19 GM/118ML enema 1 enema (1 enema Rectal Given 12/01/22 2052)    ED Course/ Medical Decision Making/ A&P                             Medical Decision Making Patient is a 42 year old female, who has not had a bowel movement in the last 9 days, she is passing gas, but was sent to the ER for rule out of possible bowel obstruction.  She does have some nausea no vomiting.  She states her stomach is distended, and is diffusely tender.  We will obtain a CT scan, for further evaluation for possible obstruction.  Amount and/or Complexity of Data Reviewed Labs: ordered.    Details: Unremarkable Radiology: ordered.    Details: CT scan shows no obstruction Discussion of management or test interpretation with external provider(s): To have no obstruction, discussed oral meds, versus enema, she would like to do an enema and see if she is able to have a bowel movement.  We utilized an enema using a Foley catheter to help with the rate of success with the enema, and she was able to have a bowel movement and feels relief.  She still states that she feels very constipated, thus we will send her home on [place], and have her follow-up with her primary care doctor.  Discussed return precautions and she voiced understanding.  CT scan in this instance was necessary given her 3 abdominal surgeries, increased risk for obstruction, and low length of time she has not had a bowel movement.  She is overall feeling well on discharge.  Risk OTC drugs. Prescription drug  management.    Final Clinical Impression(s) / ED Diagnoses Final diagnoses:  Constipation, unspecified constipation type    Rx / DC Orders ED Discharge Orders          Ordered    PEG 3350-KCl-NaBcb-NaCl-NaSulf (PEG-3350/ELECTROLYTES) 236 g SOLR   Once        12/01/22 2145  Pete Pelt, [place] 12/01/22 2339    Jacalyn Lefevre, MD 12/02/22 1505

## 2022-12-02 ENCOUNTER — Other Ambulatory Visit (HOSPITAL_BASED_OUTPATIENT_CLINIC_OR_DEPARTMENT_OTHER): Payer: Self-pay

## 2022-12-02 ENCOUNTER — Encounter (HOSPITAL_BASED_OUTPATIENT_CLINIC_OR_DEPARTMENT_OTHER): Payer: Self-pay | Admitting: Emergency Medicine

## 2022-12-02 ENCOUNTER — Emergency Department (HOSPITAL_BASED_OUTPATIENT_CLINIC_OR_DEPARTMENT_OTHER): Payer: Managed Care, Other (non HMO) | Admitting: Radiology

## 2022-12-02 ENCOUNTER — Emergency Department (HOSPITAL_BASED_OUTPATIENT_CLINIC_OR_DEPARTMENT_OTHER)
Admission: EM | Admit: 2022-12-02 | Discharge: 2022-12-02 | Disposition: A | Discharge status: Home / Self Care | Payer: Managed Care, Other (non HMO) | Attending: Emergency Medicine | Admitting: Emergency Medicine

## 2022-12-02 DIAGNOSIS — F419 Anxiety disorder, unspecified: Secondary | ICD-10-CM | POA: Diagnosis not present

## 2022-12-02 DIAGNOSIS — R0789 Other chest pain: Secondary | ICD-10-CM | POA: Insufficient documentation

## 2022-12-02 DIAGNOSIS — Z91010 Allergy to peanuts: Secondary | ICD-10-CM | POA: Insufficient documentation

## 2022-12-02 DIAGNOSIS — J45909 Unspecified asthma, uncomplicated: Secondary | ICD-10-CM | POA: Insufficient documentation

## 2022-12-02 DIAGNOSIS — Z7951 Long term (current) use of inhaled steroids: Secondary | ICD-10-CM | POA: Insufficient documentation

## 2022-12-02 HISTORY — DX: Essential (primary) hypertension: I10

## 2022-12-02 LAB — BASIC METABOLIC PANEL
Anion gap: 9 (ref 5–15)
BUN: 13 mg/dL (ref 6–20)
CO2: 29 mmol/L (ref 22–32)
Calcium: 8.9 mg/dL (ref 8.9–10.3)
Chloride: 100 mmol/L (ref 98–111)
Creatinine, Ser: 0.75 mg/dL (ref 0.44–1.00)
GFR, Estimated: >60 mL/min (ref >60)
Glucose, Bld: 91 mg/dL (ref 70–99)
Potassium: 3.2 mmol/L — ABNORMAL LOW (ref 3.5–5.1)
Sodium: 138 mmol/L (ref 135–145)

## 2022-12-02 LAB — D-DIMER, QUANTITATIVE: D-Dimer, Quant: 0.28 ug/mL-FEU (ref 0.00–0.50)

## 2022-12-02 LAB — TROPONIN I (HIGH SENSITIVITY)
Troponin I (High Sensitivity): 2 ng/L (ref <18)
Troponin I (High Sensitivity): <2 ng/L (ref <18)

## 2022-12-02 LAB — HEPATIC FUNCTION PANEL
ALT: 18 U/L (ref 0–44)
AST: 20 U/L (ref 15–41)
Albumin: 3.8 g/dL (ref 3.5–5.0)
Alkaline Phosphatase: 48 U/L (ref 38–126)
Bilirubin, Direct: 0.1 mg/dL (ref 0.0–0.2)
Indirect Bilirubin: 0.4 mg/dL (ref 0.3–0.9)
Total Bilirubin: 0.5 mg/dL (ref 0.3–1.2)
Total Protein: 6.4 g/dL — ABNORMAL LOW (ref 6.5–8.1)

## 2022-12-02 LAB — LIPASE, BLOOD: Lipase: 20 U/L (ref 11–51)

## 2022-12-02 LAB — CBC
HCT: 37.4 % (ref 36.0–46.0)
Hemoglobin: 12.5 g/dL (ref 12.0–15.0)
MCH: 30.7 pg (ref 26.0–34.0)
MCHC: 33.4 g/dL (ref 30.0–36.0)
MCV: 91.9 fL (ref 80.0–100.0)
Platelets: 254 10*3/uL (ref 150–400)
RBC: 4.07 MIL/uL (ref 3.87–5.11)
RDW: 14 % (ref 11.5–15.5)
WBC: 3.9 10*3/uL — ABNORMAL LOW (ref 4.0–10.5)
nRBC: 0 % (ref 0.0–0.2)

## 2022-12-02 MED ORDER — POTASSIUM CHLORIDE CRYS ER 20 MEQ PO TBCR
40.0000 meq | EXTENDED_RELEASE_TABLET | Freq: Once | ORAL | Status: AC
  Administered 2022-12-02: 40 meq via ORAL
  Filled 2022-12-02: qty 2

## 2022-12-02 MED ORDER — ALUM & MAG HYDROXIDE-SIMETH 200-200-20 MG/5ML PO SUSP
30.0000 mL | Freq: Once | ORAL | Status: AC
  Administered 2022-12-02: 30 mL via ORAL
  Filled 2022-12-02: qty 30

## 2022-12-02 NOTE — ED Triage Notes (Signed)
Pt was here yesterday evening for constipation, had CT with contrast, IV was in her left arm. Pt states felt fine when she went home. Within an hour of going home,started haviing back/left arm pain, this am she started with chest pain and shortness of breath.

## 2022-12-02 NOTE — ED Provider Notes (Signed)
EMERGENCY DEPARTMENT AT [facility] Provider Note   CSN: [id number] Arrival date & time: 12/02/22  4098     History  Chief Complaint  Patient presents with   Chest Pain    Carla Buck is a 42 y.o. female.  The history is provided by the patient and medical records. No language interpreter was used.  Chest Pain Pain location:  L chest and L lateral chest Pain quality: aching, dull and sharp   Pain radiates to:  Upper back, L arm and neck Pain severity:  Severe Onset quality:  Gradual Duration:  1 day Timing:  Constant Progression:  Waxing and waning Chronicity:  New Worsened by:  Nothing Ineffective treatments:  None tried Associated symptoms: back pain and shortness of breath   Associated symptoms: no abdominal pain, no altered mental status, no cough, no diaphoresis, no fatigue, no fever, no headache, no lower extremity edema, no nausea, no near-syncope, no numbness, no palpitations, no vomiting and no weakness   Risk factors: hypertension   Risk factors: not female and no prior DVT/PE        Home Medications Prior to Admission medications   Medication Sig Start Date End Date Taking? Authorizing Provider  acyclovir (ZOVIRAX) 400 MG tablet TAKE 1 TABLET BY MOUTH TWICE A DAY 10/01/22   Marcine Matar, MD  albuterol (VENTOLIN HFA) 108 (90 Base) MCG/ACT inhaler INHALE 2 PUFFS EVERY 4-6 HOURS AS NEEDED FOR COUGH, WHEEZE, TIGHTNESS IN CHEST, SHORTNESS OF BREATH 05/25/21   Nehemiah Settle, FNP  Azelastine HCl 0.15 % SOLN SPRAY 1 TO 2 SPRAYS EACH NOSTRIL 1-2 TIMES A DAY AS NEEDED FOR RUNNY NOSE/DRAINAGE DOWN THROAT 03/27/21   Ellamae Sia, DO  azithromycin (ZITHROMAX Z-PAK) 250 MG tablet 2 tabs PO x 1 then 1 tab daily 08/02/22   Marcine Matar, MD  busPIRone (BUSPAR) 5 MG tablet Take 5 mg by mouth in the morning and at bedtime. 02/11/22   [provider]  diphenhydrAMINE (BENADRYL) 25 mg capsule Take 25 mg by mouth every 6 (six)  hours as needed.    [provider]  EPINEPHrine (AUVI-Q) 0.3 mg/0.3 mL IJ SOAJ injection Inject 0.3 mg into the muscle as needed for anaphylaxis. 12/29/20   Nehemiah Settle, FNP  EPINEPHrine 0.3 mg/0.3 mL IJ SOAJ injection Inject 0.3 mg into the muscle as needed for anaphylaxis. 01/25/22   Janell Quiet, PA-C  ergocalciferol (VITAMIN D2) 1.25 MG (50000 UT) capsule Take 50,000 Units by mouth once a week.    [provider]  EUCRISA 2 % OINT Apply 1 application  topically 2 (two) times daily. 10/20/22   Marcine Matar, MD  ferrous sulfate 325 (65 FE) MG tablet Take 325 mg by mouth daily with breakfast.    [provider]  hydrochlorothiazide (HYDRODIURIL) 25 MG tablet TAKE 1 TABLET (25 MG TOTAL) BY MOUTH DAILY. 09/17/22   Marcine Matar, MD  ipratropium-albuterol (DUONEB) 0.5-2.5 (3) MG/3ML SOLN Take 3 mLs by nebulization every 6 (six) hours as needed.    [provider]  levocetirizine (XYZAL) 5 MG tablet TAKE 1 TABLET BY MOUTH TWICE A DAY 03/15/22   Ellamae Sia, DO  montelukast (SINGULAIR) 10 MG tablet TAKE 1 TABLET BY MOUTH EVERYDAY AT BEDTIME 09/17/22   Marcine Matar, MD  Multiple Vitamins-Minerals (BARIATRIC MULTIVITAMINS/IRON PO) Take 1 tablet by mouth.    [provider]  ondansetron (ZOFRAN-ODT) 4 MG disintegrating tablet TAKE 1-2 TABLETS (4-8 MG TOTAL)  BY MOUTH EVERY 8 (EIGHT) HOURS AS NEEDED FOR NAUSEA. APPOINTMENT NEEDED FOR FURTHER REFILLS. *INS LIMIT 07/28/21   Anson Fret, MD  Plecanatide (TRULANCE) 3 MG TABS Take 1 tablet (3 mg total) by mouth daily. 11/30/22   Margaretann Loveless, PA-C  propranolol (INDERAL) 10 MG tablet Take 10 mg by mouth as needed.    [provider]  rizatriptan (MAXALT-MLT) 10 MG disintegrating tablet TAKE 1 TABLET BY MOUTH AS NEEDED FOR MIGRAINE. MAY REPEAT IN 2 HOURS IF NEEDED 09/14/21   Anson Fret, MD  triamcinolone ointment (KENALOG) 0.1 % Apply 1 application topically 2 (two) times daily.  07/30/19   Bobbitt, Heywood Iles, MD  valsartan (DIOVAN) 40 MG tablet Take 1 tablet (40 mg total) by mouth daily. 10/26/22   Marcine Matar, MD  venlafaxine XR (EFFEXOR-XR) 150 MG 24 hr capsule Take 150 mg by mouth every morning. 09/08/22   [provider]  venlafaxine XR (EFFEXOR-XR) 75 MG 24 hr capsule Take 75 mg by mouth every evening.    [provider]  verapamil (CALAN) 80 MG tablet TAKE 1 TABLET (80 MG TOTAL) BY MOUTH 3 (THREE) TIMES DAILY. 09/16/21   Anson Fret, MD      Allergies    Bee venom, Peanut-containing drug products, Pollen extract, Shellfish allergy, Wasp venom, Xolair [omalizumab], Amoxicillin, Bug itch releaf [misc natural products], Contrave [naltrexone-bupropion hcl er], Corn-containing products, Dust mite extract, Gluten meal, Iodine, Lidocaine, Orange fruit [citrus], Penicillins, Sesame oil, Soy allergy, Tetracaine, and Tomato    Review of Systems   Review of Systems  Constitutional:  Negative for chills, diaphoresis, fatigue and fever.  HENT:  Negative for congestion.   Respiratory:  Positive for chest tightness and shortness of breath. Negative for cough and wheezing.   Cardiovascular:  Positive for chest pain. Negative for palpitations, leg swelling and near-syncope.  Gastrointestinal:  Positive for constipation. Negative for abdominal distention, abdominal pain, diarrhea, nausea and vomiting.  Genitourinary:  Negative for dysuria and flank pain.  Musculoskeletal:  Positive for back pain. Negative for neck pain and neck stiffness.  Skin:  Negative for rash and wound.  Neurological:  Negative for weakness, light-headedness, numbness and headaches.  Psychiatric/Behavioral:  Negative for agitation and confusion.   All other systems reviewed and are negative.   Physical Exam Updated Vital Signs BP (!) 147/94   Pulse 72   Temp 98.7 F (37.1 C) (Oral)   Resp 16   SpO2 100%  Physical Exam Vitals and nursing note reviewed.  Constitutional:       General: She is not in acute distress.    Appearance: She is well-developed. She is not ill-appearing, toxic-appearing or diaphoretic.  HENT:     Head: Normocephalic and atraumatic.  Eyes:     Conjunctiva/sclera: Conjunctivae normal.     Pupils: Pupils are equal, round, and reactive to light.  Cardiovascular:     Rate and Rhythm: Normal rate and regular rhythm.     Heart sounds: Normal heart sounds. No murmur heard. Pulmonary:     Effort: Pulmonary effort is normal. No tachypnea or respiratory distress.     Breath sounds: Normal breath sounds. No decreased breath sounds, wheezing, rhonchi or rales.  Chest:     Chest wall: No tenderness.  Abdominal:     Palpations: Abdomen is soft.     Tenderness: There is no abdominal tenderness.  Musculoskeletal:        General: No swelling.     Cervical back: Neck  supple.     Right lower leg: No tenderness. No edema.     Left lower leg: No tenderness. No edema.  Skin:    General: Skin is warm and dry.     Capillary Refill: Capillary refill takes less than 2 seconds.     Coloration: Skin is not pale.     Findings: No erythema.  Neurological:     General: No focal deficit present.     Mental Status: She is alert.  Psychiatric:        Mood and Affect: Mood is anxious.     ED Results / Procedures / Treatments   Labs (all labs ordered are listed, but only abnormal results are displayed) Labs Reviewed  BASIC METABOLIC PANEL - Abnormal; Notable for the following components:      Result Value   Potassium 3.2 (*)    All other components within normal limits  CBC - Abnormal; Notable for the following components:   WBC 3.9 (*)    All other components within normal limits  HEPATIC FUNCTION PANEL - Abnormal; Notable for the following components:   Total Protein 6.4 (*)    All other components within normal limits  LIPASE, BLOOD  D-DIMER, QUANTITATIVE  TROPONIN I (HIGH SENSITIVITY)  TROPONIN I (HIGH SENSITIVITY)    EKG EKG  Interpretation  Date/Time:  Thursday December 02 2022 07:21:16 EDT Ventricular Rate:  80 PR Interval:  161 QRS Duration: 83 QT Interval:  397 QTC Calculation: 458 R Axis:   36 Text Interpretation: Sinus rhythm Abnormal R-wave progression, early transition when compared to prior, similar appearance. No STEMI Confirmed by Theda Belfast ([id number]) on 12/02/2022 7:23:16 AM  Radiology DG Chest 2 View  Result Date: 12/02/2022 CLINICAL DATA:  chest pain EXAM: CHEST - 2 VIEW COMPARISON:  None available. FINDINGS: Cardiomediastinal silhouette and pulmonary vasculature are within normal limits. Lungs are clear. IMPRESSION: No acute cardiopulmonary process. Electronically Signed   By: Acquanetta Belling M.D.   On: 12/02/2022 07:47   CT ABDOMEN PELVIS W CONTRAST  Result Date: 12/01/2022 CLINICAL DATA:  Concern for bowel obstruction. EXAM: CT ABDOMEN AND PELVIS WITH CONTRAST TECHNIQUE: Multidetector CT imaging of the abdomen and pelvis was performed using the standard protocol following bolus administration of intravenous contrast. RADIATION DOSE REDUCTION: This exam was performed according to the departmental dose-optimization program which includes automated exposure control, adjustment of the mA and/or kV according to patient size and/or use of iterative reconstruction technique. CONTRAST:  OMNIPAQUE IOHEXOL 300 MG/ML  SOLN COMPARISON:  None Available. FINDINGS: Lower chest: The visualized lung bases are clear. No intra-abdominal free air or free fluid. Hepatobiliary: No focal liver abnormality is seen. No gallstones, gallbladder wall thickening, or biliary dilatation. Pancreas: Unremarkable. No pancreatic ductal dilatation or surrounding inflammatory changes. Spleen: Normal in size without focal abnormality. Adrenals/Urinary Tract: Adrenal glands are unremarkable. Kidneys are normal, without renal calculi, focal lesion, or hydronephrosis. Bladder is unremarkable. Stomach/Bowel: Postsurgical changes of the bowel  with multiple anastomotic sutures. There is no bowel obstruction or active inflammation. The appendix is normal. Vascular/Lymphatic: The abdominal aorta and IVC unremarkable. No portal venous gas. There is no adenopathy. Reproductive: Hysterectomy.  No adnexal masses. Other: None Musculoskeletal: No acute or significant osseous findings. IMPRESSION: No acute intra-abdominal or pelvic pathology. No bowel obstruction. Normal appendix. Electronically Signed   By: Elgie Collard M.D.   On: 12/01/2022 19:39    Procedures Procedures    Medications Ordered in ED Medications  alum & mag hydroxide-simeth (MAALOX/MYLANTA)  200-200-20 MG/5ML suspension 30 mL (30 mLs Oral Given 12/02/22 1013)  potassium chloride SA (KLOR-CON M) CR tablet 40 mEq (40 mEq Oral Given 12/02/22 1012)    ED Course/ Medical Decision Making/ A&P                             Medical Decision Making Amount and/or Complexity of Data Reviewed Labs: ordered. Radiology: ordered.  Risk OTC drugs. Prescription drug management.    Carla Silvas - Delories Buck is a 42 y.o. female with a past medical history significant for migraines, asthma, previous gastric bypass surgery, anxiety, depression, eczema, previous breast lumpectomy, left oophorectomy, and hysterectomy who was seen yesterday for constipation and abdominal pain/distention who presents with left-sided chest pain and shortness of breath.  According to patient, she was seen here yesterday and had CT imaging that did not show obstruction.  Patient is of having an enema and had a small bowel movement although she reports she has not had a bowel movement since leaving.  She reports shortly after leaving, she started having left-sided chest pain overnight that was moderate to severe.  She reports it did not matter what she was doing or deep breathing but she was having this stabbing and pressure in her left chest that wraps around towards the back and a little bit down her left arm.   Also goes towards her left neck.  She reports no further nausea or vomiting and reports her abdomen is not hurting like it was yesterday and the pressure has improved.  Denies any leg pain or leg swelling.  Denies history of blood clots.  Denies trauma.  Denies any rash to suggest shingles.  Patient appears anxious.  Denies other fevers, chills, congestion, cough.  On exam, lungs were completely clear.  No wheezing.  Chest was nontender and I cannot reduce discomfort.  Did not appreciate rash initially.  Back was nontender.  Abdomen was nontender on my exam.  Bowel sounds were appreciated.  Legs nontender nonedematous.  Arm subjectively did not appear edematous and she had intact pulses.  Patient felt subjectively her left arm felt slightly swollen.  EKG appears unchanged from prior with no evidence of STEMI.  Given patient's scription of left-sided chest pain with shortness of breath we will get workup to rule out concerning etiology of her symptoms.  Will get chest x-rays, delta troponin, as well as a D-dimer given the location and description of symptoms.  Will get screening labs as well to look for discomfort below her diaphragm causing left-sided pain.  Anticipate reassessment after workup to determine disposition.   9:46 AM Workup has begun to return.  X-ray reassuring.  EKG for similar to prior with no STEMI.  The first troponin is negative, will trend.  D-dimer negative, doubt clot in her chest or arm.  Labs overall reassuring with only mild hypokalemia.  Patient when she is been taking different medications to help move her bowels so this may have caused some irritation to her stomach or esophagus.  Will give a GI cocktail and will give her a small amount of potassium repletion.  If second troponin is negative, anticipate discharge home with outpatient follow-up.  10:21 AM Second troponin returned reassuring.  Patient was just given the Maalox and potassium.  Will p.o. challenge and  reassess, dissipate discharge home given reassuring workup thus far for outpatient PCP follow-up.  11:35 AM Patient passed p.o. challenge and is feeling somewhat  better from a discomfort standpoint.  I suspect there was some esophageal and gastric discomfort as well as pain from the constipation causing some of her chest discomfort.  We discussed all this and her reassuring workup and we feel safe with her being discharged.  Patient will follow-up with outpatient PCP as well as GI and use the medications to move her bowels she was given yesterday.  She agrees with plan of care and had no other questions or concerns.  She understands return precautions and was discharged in good condition with improving symptoms.        Final Clinical Impression(s) / ED Diagnoses Final diagnoses:  Chest discomfort    Rx / DC Orders ED Discharge Orders     None      Clinical Impression: 1. Chest discomfort     Disposition: Discharge  Condition: Good  I have discussed the results, Dx and Tx plan with the pt(& family if present). He/she/they expressed understanding and agree(s) with the plan. Discharge instructions discussed at great length. Strict return precautions discussed and pt &/or family have verbalized understanding of the instructions. No further questions at time of discharge.    New Prescriptions   No medications on file    Follow Up: Marcine Matar, MD [address] [place] [address] [phone]  In 1 day   Gastroenterology, [place] Sprang [address] [place] [address] [place] [address] [phone]     [facility] [address] [place] [address] [phone]       Lash Matulich, Canary Brim, MD 12/02/22 [phone]

## 2022-12-02 NOTE — Discharge Instructions (Signed)
Your history, exam, workup today was overall reassuring.  The heart enzymes were negative both times we checked them and your blood clot test was negative.  Your x-ray was reassuring and your EKG appeared unchanged from prior.  Clinically we feel you are not having a cardiac or lung cause of your symptoms given the reassuring workup however given the improvement with the Maalox it may be related to some of the bowel troubles you have been having for the last few days and all of the constipation.  The x-ray did not show evidence of bowel obstruction and the CT yesterday did not show bowel obstruction.  Please use the medications they gave yesterday to help move your bowels as this may also help with your discomfort.  Please rest and stay hydrated and follow-up with the outpatient GI team.  If any symptoms change or worsen acutely, please return to the nearest emergency department.

## 2022-12-02 NOTE — ED Notes (Signed)
Dc instructions reviewed with patient. Patient voiced understanding. Dc with belongings.  °

## 2022-12-06 ENCOUNTER — Encounter: Payer: Self-pay | Admitting: Internal Medicine

## 2022-12-10 ENCOUNTER — Encounter: Payer: Managed Care, Other (non HMO) | Admitting: Physician Assistant

## 2022-12-13 ENCOUNTER — Encounter: Payer: Managed Care, Other (non HMO) | Admitting: Physician Assistant

## 2022-12-21 ENCOUNTER — Other Ambulatory Visit: Payer: Self-pay | Admitting: Internal Medicine

## 2022-12-22 NOTE — Telephone Encounter (Signed)
Requested Prescriptions  Pending Prescriptions Disp Refills   acyclovir (ZOVIRAX) 400 MG tablet [Pharmacy Med Name: ACYCLOVIR 400 MG TABLET] 180 tablet 0    Sig: TAKE 1 TABLET BY MOUTH TWICE A DAY     Antimicrobials:  Antiviral Agents - Anti-Herpetic Passed - 12/21/2022  8:51 PM      Passed - Valid encounter within last 12 months    Recent Outpatient Visits           1 month ago Essential hypertension   [place] [facility] Jonah Blue B, MD   3 months ago Class 2 severe obesity with serious comorbidity and body mass index (BMI) of 37.0 to 37.9 in adult, unspecified obesity type [facility])   [place] [facility] Marcine Matar, MD   4 months ago Viral respiratory illness   [place] [facility] Marcine Matar, MD   6 months ago Essential hypertension   [place] [facility] Marcine Matar, MD   9 months ago Essential hypertension   [place] [facility] Marcine Matar, MD

## 2022-12-25 ENCOUNTER — Other Ambulatory Visit: Payer: Self-pay | Admitting: Internal Medicine

## 2022-12-25 DIAGNOSIS — I10 Essential (primary) hypertension: Secondary | ICD-10-CM

## 2022-12-25 DIAGNOSIS — Z9109 Other allergy status, other than to drugs and biological substances: Secondary | ICD-10-CM

## 2022-12-27 NOTE — Telephone Encounter (Signed)
Requested Prescriptions  Refused Prescriptions Disp Refills   montelukast (SINGULAIR) 10 MG tablet [Pharmacy Med Name: MONTELUKAST SOD 10 MG TABLET] 90 tablet 1    Sig: TAKE 1 TABLET BY MOUTH EVERYDAY AT BEDTIME     Pulmonology:  Leukotriene Inhibitors Passed - 12/25/2022 12:27 PM      Passed - Valid encounter within last 12 months    Recent Outpatient Visits           2 months ago Essential hypertension   [place] [facility] Jonah Blue B, MD   3 months ago Class 2 severe obesity with serious comorbidity and body mass index (BMI) of 37.0 to 37.9 in adult, unspecified obesity type [facility])   [place] [facility] Marcine Matar, MD   4 months ago Viral respiratory illness   [place] [facility] Marcine Matar, MD   6 months ago Essential hypertension   [facility] Marcine Matar, MD   9 months ago Essential hypertension   [place] [facility] Jonah Blue B, MD               hydrochlorothiazide (HYDRODIURIL) 25 MG tablet [Pharmacy Med Name: HYDROCHLOROTHIAZIDE 25 MG TAB] 90 tablet 1    Sig: TAKE 1 TABLET (25 MG TOTAL) BY MOUTH DAILY.     Cardiovascular: Diuretics - Thiazide Failed - 12/25/2022 12:27 PM      Failed - K in normal range and within 180 days    Potassium  Date Value Ref Range Status  12/02/2022 3.2 (L) 3.5 - 5.1 mmol/L Final         Passed - Cr in normal range and within 180 days    Creatinine, Ser  Date Value Ref Range Status  12/02/2022 0.75 0.44 - 1.00 mg/dL Final         Passed - Na in normal range and within 180 days    Sodium  Date Value Ref Range Status  12/02/2022 138 135 - 145 mmol/L Final  06/04/2022 141 134 - 144 mmol/L Final         Passed - Last BP in normal range    BP Readings from Last 1 Encounters:  12/02/22 101/78         Passed - Valid encounter within last 6 months    Recent  Outpatient Visits           2 months ago Essential hypertension   [place] [facility] Jonah Blue B, MD   3 months ago Class 2 severe obesity with serious comorbidity and body mass index (BMI) of 37.0 to 37.9 in adult, unspecified obesity type [facility])   [place] [facility] Marcine Matar, MD   4 months ago Viral respiratory illness   [place] [facility] Marcine Matar, MD   6 months ago Essential hypertension   [place] [facility] Marcine Matar, MD   9 months ago Essential hypertension   [place] [facility] Marcine Matar, MD

## 2023-02-14 ENCOUNTER — Ambulatory Visit: Payer: Self-pay | Admitting: Internal Medicine

## 2023-02-22 ENCOUNTER — Ambulatory Visit (HOSPITAL_BASED_OUTPATIENT_CLINIC_OR_DEPARTMENT_OTHER): Payer: Managed Care, Other (non HMO) | Admitting: Obstetrics & Gynecology

## 2023-03-03 ENCOUNTER — Ambulatory Visit: Payer: Self-pay | Admitting: Physician Assistant

## 2023-03-08 LAB — HM COLONOSCOPY

## 2023-03-14 ENCOUNTER — Other Ambulatory Visit: Payer: Self-pay | Admitting: Physician Assistant

## 2023-03-14 MED ORDER — HYDROCODONE-ACETAMINOPHEN 5-325 MG PO TABS: 1.0000 | ORAL_TABLET | Freq: Three times a day (TID) | ORAL | 0 refills | Status: DC | PRN

## 2023-03-14 MED ORDER — ONDANSETRON HCL 4 MG PO TABS: 4.0000 mg | ORAL_TABLET | Freq: Three times a day (TID) | ORAL | 0 refills | Status: DC | PRN

## 2023-03-15 ENCOUNTER — Encounter: Payer: Self-pay | Admitting: Orthopaedic Surgery

## 2023-03-16 ENCOUNTER — Other Ambulatory Visit: Payer: Self-pay | Admitting: Physician Assistant

## 2023-03-16 MED ORDER — HYDROCODONE-ACETAMINOPHEN 10-325 MG PO TABS: ORAL_TABLET | ORAL | 0 refills | Status: DC

## 2023-03-16 NOTE — Telephone Encounter (Signed)
Sent in the norco 10 with different instructions

## 2023-03-17 DIAGNOSIS — M67432 Ganglion, left wrist: Secondary | ICD-10-CM

## 2023-03-19 ENCOUNTER — Other Ambulatory Visit: Payer: Self-pay | Admitting: Internal Medicine

## 2023-03-21 NOTE — Telephone Encounter (Signed)
Requested Prescriptions  Pending Prescriptions Disp Refills   acyclovir (ZOVIRAX) 400 MG tablet [Pharmacy Med Name: ACYCLOVIR 400 MG TABLET] 180 tablet 0    Sig: TAKE 1 TABLET BY MOUTH TWICE A DAY     Antimicrobials:  Antiviral Agents - Anti-Herpetic Passed - 03/19/2023  6:27 AM      Passed - Valid encounter within last 12 months    Recent Outpatient Visits           4 months ago Essential hypertension   [place] [facility] Jonah Blue B, MD   6 months ago Class 2 severe obesity with serious comorbidity and body mass index (BMI) of 37.0 to 37.9 in adult, unspecified obesity type [facility])   [place] [facility] Marcine Matar, MD   7 months ago Viral respiratory illness   [place] [facility] Marcine Matar, MD   9 months ago Essential hypertension   [place] [facility] Marcine Matar, MD   12 months ago Essential hypertension   [place] [facility] Marcine Matar, MD

## 2023-03-22 ENCOUNTER — Encounter: Payer: Self-pay | Admitting: Internal Medicine

## 2023-03-23 ENCOUNTER — Other Ambulatory Visit: Payer: Self-pay | Admitting: Internal Medicine

## 2023-03-23 ENCOUNTER — Encounter: Payer: Self-pay | Admitting: Internal Medicine

## 2023-03-23 DIAGNOSIS — E876 Hypokalemia: Secondary | ICD-10-CM

## 2023-03-23 DIAGNOSIS — I10 Essential (primary) hypertension: Secondary | ICD-10-CM

## 2023-03-23 MED ORDER — HYDROCHLOROTHIAZIDE 25 MG PO TABS
25.0000 mg | ORAL_TABLET | Freq: Every day | ORAL | 1 refills | Status: DC
Start: 2023-03-23 — End: 2023-09-14

## 2023-03-23 NOTE — Progress Notes (Signed)
Colonoscopy report received from [facility] gastroenterology Dr. Levora Angel. Colonoscopy performed 03/08/2023. Two 4-6 mm polyps removed from the rectum.  Pathology report pending.

## 2023-03-29 ENCOUNTER — Ambulatory Visit: Payer: Managed Care, Other (non HMO) | Attending: Family Medicine

## 2023-03-29 DIAGNOSIS — I10 Essential (primary) hypertension: Secondary | ICD-10-CM

## 2023-03-29 DIAGNOSIS — E876 Hypokalemia: Secondary | ICD-10-CM

## 2023-03-29 LAB — HM MAMMOGRAPHY

## 2023-03-30 ENCOUNTER — Other Ambulatory Visit (INDEPENDENT_AMBULATORY_CARE_PROVIDER_SITE_OTHER): Payer: Managed Care, Other (non HMO)

## 2023-03-30 ENCOUNTER — Ambulatory Visit (INDEPENDENT_AMBULATORY_CARE_PROVIDER_SITE_OTHER): Payer: Managed Care, Other (non HMO) | Admitting: Physician Assistant

## 2023-03-30 ENCOUNTER — Encounter: Payer: Self-pay | Admitting: Physician Assistant

## 2023-03-30 DIAGNOSIS — M67432 Ganglion, left wrist: Secondary | ICD-10-CM
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
## 2023-04-03 ENCOUNTER — Encounter: Payer: Self-pay | Admitting: Orthopaedic Surgery

## 2023-04-05 NOTE — Telephone Encounter (Signed)
See message.

## 2023-04-05 NOTE — Telephone Encounter (Signed)
Sounds good

## 2023-04-06 ENCOUNTER — Ambulatory Visit: Payer: Managed Care, Other (non HMO)

## 2023-04-09 ENCOUNTER — Encounter: Payer: Self-pay | Admitting: Internal Medicine

## 2023-04-12 ENCOUNTER — Encounter: Payer: Self-pay | Admitting: Orthopaedic Surgery

## 2023-04-12 ENCOUNTER — Ambulatory Visit (INDEPENDENT_AMBULATORY_CARE_PROVIDER_SITE_OTHER): Payer: Managed Care, Other (non HMO) | Admitting: Orthopaedic Surgery

## 2023-04-12 DIAGNOSIS — M67432 Ganglion, left wrist: Secondary | ICD-10-CM
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
## 2023-04-14 ENCOUNTER — Ambulatory Visit: Payer: Managed Care, Other (non HMO) | Admitting: Occupational Therapy

## 2023-04-14 ENCOUNTER — Encounter: Payer: Self-pay | Admitting: Occupational Therapy

## 2023-04-14 ENCOUNTER — Other Ambulatory Visit: Payer: Self-pay

## 2023-04-14 DIAGNOSIS — M6281 Muscle weakness (generalized): Secondary | ICD-10-CM

## 2023-04-14 DIAGNOSIS — M25632 Stiffness of left wrist, not elsewhere classified: Secondary | ICD-10-CM

## 2023-04-14 DIAGNOSIS — R208 Other disturbances of skin sensation: Secondary | ICD-10-CM

## 2023-04-14 DIAGNOSIS — M25642 Stiffness of left hand, not elsewhere classified: Secondary | ICD-10-CM

## 2023-04-14 DIAGNOSIS — M25532 Pain in left wrist: Secondary | ICD-10-CM

## 2023-04-14 DIAGNOSIS — R278 Other lack of coordination: Secondary | ICD-10-CM

## 2023-04-15 ENCOUNTER — Encounter: Payer: Self-pay | Admitting: Internal Medicine

## 2023-04-19 ENCOUNTER — Ambulatory Visit: Payer: Managed Care, Other (non HMO)

## 2023-04-19 NOTE — Progress Notes (Signed)
Patient came in. 4 weeks out from ganglion removal of wrist. Internal stitch was exposed. [place] clipped stitch. Looks great otherwise.

## 2023-04-20 ENCOUNTER — Telehealth: Payer: Self-pay | Admitting: Internal Medicine

## 2023-04-20 NOTE — Telephone Encounter (Signed)
Phone call placed to patient today in regards to a MyChart message she had sent me several days ago.  Patient had requested whether I can send a prescription for [facility] to [facility] in [place].  I informed the patient that I am reluctant to do so based on recent medical articles I have read regarding GLP-1 agents being done through compounding pharmacies.  There is no guarantee of what is in them and the appropriate strengths.  I do not think it is best practices at this time and I did not want to do anything that would jeopardize her health or my license.  Patient expressed understanding.  I only hope that in the near future pharmaceutical companies would come down on the prices of GLP-1 agents so that they are more affordable to the general public.
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
## 2023-04-21 ENCOUNTER — Ambulatory Visit (INDEPENDENT_AMBULATORY_CARE_PROVIDER_SITE_OTHER): Payer: Managed Care, Other (non HMO) | Admitting: Occupational Therapy

## 2023-04-21 ENCOUNTER — Encounter: Payer: Self-pay | Admitting: Occupational Therapy

## 2023-04-21 ENCOUNTER — Other Ambulatory Visit: Payer: Self-pay | Admitting: Internal Medicine

## 2023-04-21 DIAGNOSIS — R278 Other lack of coordination: Secondary | ICD-10-CM | POA: Diagnosis not present

## 2023-04-21 DIAGNOSIS — R208 Other disturbances of skin sensation: Secondary | ICD-10-CM | POA: Diagnosis not present

## 2023-04-21 DIAGNOSIS — M25642 Stiffness of left hand, not elsewhere classified: Secondary | ICD-10-CM

## 2023-04-21 DIAGNOSIS — M25632 Stiffness of left wrist, not elsewhere classified: Secondary | ICD-10-CM | POA: Diagnosis not present

## 2023-04-21 DIAGNOSIS — M25532 Pain in left wrist: Secondary | ICD-10-CM

## 2023-04-21 DIAGNOSIS — M6281 Muscle weakness (generalized): Secondary | ICD-10-CM

## 2023-04-27 ENCOUNTER — Encounter: Payer: Managed Care, Other (non HMO) | Admitting: Rehabilitative and Restorative Service Providers"

## 2023-04-29 ENCOUNTER — Encounter: Payer: Self-pay | Admitting: Rehabilitative and Restorative Service Providers"

## 2023-04-29 ENCOUNTER — Ambulatory Visit: Payer: Managed Care, Other (non HMO) | Admitting: Rehabilitative and Restorative Service Providers"

## 2023-04-29 DIAGNOSIS — M25532 Pain in left wrist: Secondary | ICD-10-CM

## 2023-04-29 DIAGNOSIS — R278 Other lack of coordination: Secondary | ICD-10-CM

## 2023-04-29 DIAGNOSIS — R208 Other disturbances of skin sensation: Secondary | ICD-10-CM | POA: Diagnosis not present

## 2023-04-29 DIAGNOSIS — M25632 Stiffness of left wrist, not elsewhere classified: Secondary | ICD-10-CM

## 2023-04-29 DIAGNOSIS — M25642 Stiffness of left hand, not elsewhere classified: Secondary | ICD-10-CM

## 2023-04-29 DIAGNOSIS — M6281 Muscle weakness (generalized): Secondary | ICD-10-CM
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
## 2023-05-03 ENCOUNTER — Ambulatory Visit
Admission: RE | Admit: 2023-05-03 | Discharge: 2023-05-03 | Disposition: A | Discharge status: Home / Self Care | Payer: Managed Care, Other (non HMO) | Source: Ambulatory Visit

## 2023-05-03 ENCOUNTER — Other Ambulatory Visit: Payer: Self-pay

## 2023-05-03 VITALS — BP 107/74 | HR 85 | Temp 98.0°F | Resp 20

## 2023-05-03 DIAGNOSIS — J111 Influenza due to unidentified influenza virus with other respiratory manifestations: Secondary | ICD-10-CM

## 2023-05-03 DIAGNOSIS — J309 Allergic rhinitis, unspecified: Secondary | ICD-10-CM

## 2023-05-03 LAB — SARS CORONAVIRUS 2 (TAT 6-24 HRS): SARS Coronavirus 2: NEGATIVE

## 2023-05-03 NOTE — ED Notes (Signed)
Patient discharged by this nurse after obtaining covid swab
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
## 2023-05-03 NOTE — Discharge Instructions (Addendum)
Advised patient may take OTC Allegra 180 mg fexofenadine without D daily for 5 days for concurrent postnasal drainage/drip.  Advised patient may take OTC Tylenol 1 g every 6 hours for fever (oral temperature greater than 100.3).  Encouraged to increase daily water intake to 64 ounces per day while taking these medications.  Advised we will follow-up with COVID-19 PCR results once received.  Advised if symptoms worsen and/or unresolved please follow-up PCP or here for further evaluation.

## 2023-05-03 NOTE — ED Triage Notes (Signed)
Symptoms started 05/02/2023.  Complains of severe congestion, body aches, headache, sore throat.  Home covid test was negative.  Reports non-productive cough, drainage from head is going down throat.  Denies fever.    Patient has taken tylenol and theraflu.  Has not had any medicines today

## 2023-05-04 ENCOUNTER — Encounter: Payer: Managed Care, Other (non HMO) | Admitting: Rehabilitative and Restorative Service Providers"

## 2023-05-06 ENCOUNTER — Encounter: Payer: Managed Care, Other (non HMO) | Admitting: Rehabilitative and Restorative Service Providers"
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
## 2023-05-09 ENCOUNTER — Encounter: Payer: Self-pay | Admitting: Rehabilitative and Restorative Service Providers"

## 2023-05-09 ENCOUNTER — Ambulatory Visit (INDEPENDENT_AMBULATORY_CARE_PROVIDER_SITE_OTHER): Payer: Managed Care, Other (non HMO) | Admitting: Rehabilitative and Restorative Service Providers"

## 2023-05-09 DIAGNOSIS — R278 Other lack of coordination: Secondary | ICD-10-CM | POA: Diagnosis not present

## 2023-05-09 DIAGNOSIS — R208 Other disturbances of skin sensation: Secondary | ICD-10-CM

## 2023-05-09 DIAGNOSIS — M25532 Pain in left wrist: Secondary | ICD-10-CM

## 2023-05-09 DIAGNOSIS — M6281 Muscle weakness (generalized): Secondary | ICD-10-CM

## 2023-05-09 DIAGNOSIS — M25642 Stiffness of left hand, not elsewhere classified: Secondary | ICD-10-CM

## 2023-05-09 DIAGNOSIS — M25632 Stiffness of left wrist, not elsewhere classified: Secondary | ICD-10-CM | POA: Diagnosis not present

## 2023-05-13 ENCOUNTER — Encounter: Payer: Managed Care, Other (non HMO) | Admitting: Rehabilitative and Restorative Service Providers"

## 2023-05-19 ENCOUNTER — Encounter: Payer: Self-pay | Admitting: Rehabilitative and Restorative Service Providers"

## 2023-05-19 ENCOUNTER — Ambulatory Visit: Payer: Managed Care, Other (non HMO) | Admitting: Rehabilitative and Restorative Service Providers"

## 2023-05-19 DIAGNOSIS — M25632 Stiffness of left wrist, not elsewhere classified: Secondary | ICD-10-CM | POA: Diagnosis not present

## 2023-05-19 DIAGNOSIS — M25642 Stiffness of left hand, not elsewhere classified: Secondary | ICD-10-CM

## 2023-05-19 DIAGNOSIS — M6281 Muscle weakness (generalized): Secondary | ICD-10-CM

## 2023-05-19 DIAGNOSIS — R278 Other lack of coordination: Secondary | ICD-10-CM | POA: Diagnosis not present

## 2023-05-19 DIAGNOSIS — M25532 Pain in left wrist: Secondary | ICD-10-CM | POA: Diagnosis not present

## 2023-05-19 DIAGNOSIS — R208 Other disturbances of skin sensation: Secondary | ICD-10-CM | POA: Diagnosis not present
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
## 2023-05-23 ENCOUNTER — Ambulatory Visit (INDEPENDENT_AMBULATORY_CARE_PROVIDER_SITE_OTHER): Payer: Managed Care, Other (non HMO) | Admitting: Rehabilitative and Restorative Service Providers"

## 2023-05-23 ENCOUNTER — Encounter: Payer: Self-pay | Admitting: Rehabilitative and Restorative Service Providers"

## 2023-05-23 DIAGNOSIS — M25632 Stiffness of left wrist, not elsewhere classified: Secondary | ICD-10-CM | POA: Diagnosis not present

## 2023-05-23 DIAGNOSIS — M25532 Pain in left wrist: Secondary | ICD-10-CM | POA: Diagnosis not present

## 2023-05-23 DIAGNOSIS — M25642 Stiffness of left hand, not elsewhere classified: Secondary | ICD-10-CM

## 2023-05-23 DIAGNOSIS — R278 Other lack of coordination: Secondary | ICD-10-CM | POA: Diagnosis not present

## 2023-05-23 DIAGNOSIS — M6281 Muscle weakness (generalized): Secondary | ICD-10-CM

## 2023-05-23 DIAGNOSIS — R208 Other disturbances of skin sensation: Secondary | ICD-10-CM | POA: Diagnosis not present

## 2023-05-24 ENCOUNTER — Ambulatory Visit: Payer: Managed Care, Other (non HMO) | Admitting: Orthopaedic Surgery

## 2023-05-24 ENCOUNTER — Encounter: Payer: Self-pay | Admitting: Orthopaedic Surgery

## 2023-05-24 DIAGNOSIS — M67432 Ganglion, left wrist: Secondary | ICD-10-CM
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
## 2023-05-30 ENCOUNTER — Ambulatory Visit (INDEPENDENT_AMBULATORY_CARE_PROVIDER_SITE_OTHER): Payer: Managed Care, Other (non HMO) | Admitting: Rehabilitative and Restorative Service Providers"

## 2023-05-30 ENCOUNTER — Encounter: Payer: Self-pay | Admitting: Rehabilitative and Restorative Service Providers"

## 2023-05-30 DIAGNOSIS — R278 Other lack of coordination: Secondary | ICD-10-CM | POA: Diagnosis not present

## 2023-05-30 DIAGNOSIS — M25642 Stiffness of left hand, not elsewhere classified: Secondary | ICD-10-CM

## 2023-05-30 DIAGNOSIS — M25632 Stiffness of left wrist, not elsewhere classified: Secondary | ICD-10-CM | POA: Diagnosis not present

## 2023-05-30 DIAGNOSIS — M25532 Pain in left wrist: Secondary | ICD-10-CM | POA: Diagnosis not present

## 2023-05-30 DIAGNOSIS — R208 Other disturbances of skin sensation: Secondary | ICD-10-CM

## 2023-05-30 DIAGNOSIS — M6281 Muscle weakness (generalized): Secondary | ICD-10-CM

## 2023-06-02 ENCOUNTER — Encounter: Payer: Managed Care, Other (non HMO) | Admitting: Rehabilitative and Restorative Service Providers"

## 2023-06-06 ENCOUNTER — Ambulatory Visit: Payer: Managed Care, Other (non HMO) | Admitting: Internal Medicine

## 2023-06-06 ENCOUNTER — Encounter: Payer: Managed Care, Other (non HMO) | Admitting: Rehabilitative and Restorative Service Providers"

## 2023-06-09 ENCOUNTER — Encounter: Payer: Managed Care, Other (non HMO) | Admitting: Rehabilitative and Restorative Service Providers"

## 2023-06-10 ENCOUNTER — Ambulatory Visit: Payer: Managed Care, Other (non HMO) | Admitting: Internal Medicine
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
## 2023-06-15 ENCOUNTER — Ambulatory Visit (INDEPENDENT_AMBULATORY_CARE_PROVIDER_SITE_OTHER): Payer: Managed Care, Other (non HMO) | Admitting: Rehabilitative and Restorative Service Providers"

## 2023-06-15 ENCOUNTER — Encounter: Payer: Self-pay | Admitting: Rehabilitative and Restorative Service Providers"

## 2023-06-15 DIAGNOSIS — M25632 Stiffness of left wrist, not elsewhere classified: Secondary | ICD-10-CM | POA: Diagnosis not present

## 2023-06-15 DIAGNOSIS — M25642 Stiffness of left hand, not elsewhere classified: Secondary | ICD-10-CM

## 2023-06-15 DIAGNOSIS — R208 Other disturbances of skin sensation: Secondary | ICD-10-CM

## 2023-06-15 DIAGNOSIS — R278 Other lack of coordination: Secondary | ICD-10-CM | POA: Diagnosis not present

## 2023-06-15 DIAGNOSIS — M25532 Pain in left wrist: Secondary | ICD-10-CM

## 2023-06-15 DIAGNOSIS — M6281 Muscle weakness (generalized): Secondary | ICD-10-CM

## 2023-06-16 ENCOUNTER — Other Ambulatory Visit: Payer: Self-pay | Admitting: Internal Medicine

## 2023-06-28 ENCOUNTER — Other Ambulatory Visit: Payer: Self-pay | Admitting: Internal Medicine

## 2023-06-29 MED ORDER — ACYCLOVIR 400 MG PO TABS: 400.0000 mg | ORAL_TABLET | Freq: Two times a day (BID) | ORAL | 1 refills | Status: DC

## 2023-07-05 ENCOUNTER — Ambulatory Visit: Payer: Managed Care, Other (non HMO) | Admitting: Orthopaedic Surgery

## 2023-07-12 ENCOUNTER — Ambulatory Visit: Payer: Managed Care, Other (non HMO) | Admitting: Orthopaedic Surgery

## 2023-07-17 ENCOUNTER — Other Ambulatory Visit: Payer: Self-pay | Admitting: Internal Medicine

## 2023-07-18 ENCOUNTER — Encounter: Payer: Self-pay | Admitting: Internal Medicine

## 2023-07-20 ENCOUNTER — Telehealth: Payer: Self-pay

## 2023-07-20 NOTE — Telephone Encounter (Signed)
Called & spoke to the patient. Verified name & DOB. Informed that at this time there is no need to be fasting. Patient expressed verbal understanding. No further questions at this time.

## 2023-07-20 NOTE — Telephone Encounter (Signed)
Copied from CRM [id number]. Topic: General - Inquiry >> Jul 20, 2023  3:39 PM Lennox Pippins wrote: Patient has been scheduled for CPE 08/12/2023 and wants to know if she will have to have bloodwork done on this day and will this require her to be fasting?  Please follow back up with the patient.   Patient's call back # [phone]
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
## 2023-07-26 ENCOUNTER — Encounter: Payer: Self-pay | Admitting: Orthopaedic Surgery

## 2023-07-26 ENCOUNTER — Ambulatory Visit: Payer: Managed Care, Other (non HMO) | Admitting: Orthopaedic Surgery

## 2023-07-26 DIAGNOSIS — M67432 Ganglion, left wrist: Secondary | ICD-10-CM | POA: Diagnosis not present

## 2023-08-12 ENCOUNTER — Encounter: Payer: Managed Care, Other (non HMO) | Admitting: Internal Medicine

## 2023-08-22 ENCOUNTER — Encounter: Payer: Self-pay | Admitting: Internal Medicine

## 2023-08-22 ENCOUNTER — Other Ambulatory Visit: Payer: Self-pay | Admitting: Internal Medicine

## 2023-08-22 NOTE — Telephone Encounter (Signed)
 Requested medication (s) are due for refill today - yes  Requested medication (s) are on the active medication list -yes  Future visit scheduled -yes  Last refill: 10/20/22 100g 1RF  Notes to clinic: off protococl- provider review   Requested Prescriptions  Pending Prescriptions Disp Refills   EUCRISA  2 % OINT [Pharmacy Med Name: EUCRISA  2% OINTMENT] 60 g 1    Sig: APPLY TO AFFECTED AREA TWICE A DAY     Off-Protocol Failed - 08/22/2023 12:29 PM      Failed - Medication not assigned to a protocol, review manually.      Passed - Valid encounter within last 12 months    Recent Outpatient Visits           10 months ago Essential hypertension   [facility] Dept Of [place]. [facility] Vicci Sober B, MD   11 months ago Class 2 severe obesity with serious comorbidity and body mass index (BMI) of 37.0 to 37.9 in adult, unspecified obesity type [facility])   [place] [facility] - [facility] Dept Of [place]. [facility] Vicci Sober NOVAK, MD   1 year ago Viral respiratory illness   [facility] [facility] Of [place]. [facility] Vicci Sober NOVAK, MD   1 year ago Essential hypertension   [place] [facility] Dept Of [place]. [facility] Vicci Sober NOVAK, MD   1 year ago Essential hypertension   [place] [facility] [place] - [facility] Dept Of [place]. [facility] Vicci Sober NOVAK, MD       Future Appointments             In 3 weeks Vicci Sober NOVAK, MD [facility] [place] - A Dept Of [place]. [facility]               Requested Prescriptions  Pending Prescriptions Disp Refills   EUCRISA  2 % OINT [Pharmacy Med Name: EUCRISA  2% OINTMENT] 60 g 1    Sig: APPLY TO AFFECTED AREA TWICE A DAY     Off-Protocol Failed - 08/22/2023 12:29 PM      Failed - Medication not assigned to a protocol, review manually.      Passed - Valid encounter within  last 12 months    Recent Outpatient Visits           10 months ago Essential hypertension   [place] [facility] [place] - [facility] Dept Of [place]. [facility] Vicci Sober B, MD   11 months ago Class 2 severe obesity with serious comorbidity and body mass index (BMI) of 37.0 to 37.9 in adult, unspecified obesity type [facility])   [place] [facility] - [facility] Dept Of [place]. [facility] Vicci Sober NOVAK, MD   1 year ago Viral respiratory illness   [facility] - A Dept Of [place]. [facility] Vicci Sober NOVAK, MD   1 year ago Essential hypertension   [place] [facility] - [facility] Dept Of [place]. [facility] Vicci Sober NOVAK, MD   1 year ago Essential hypertension   [place] [facility] - [facility] Dept Of [place]. [facility] Vicci Sober NOVAK, MD       Future Appointments  In 3 weeks Vicci Barnie NOVAK, MD [facility] [place] - A Dept Of [place]. [facility]

## 2023-09-13 ENCOUNTER — Ambulatory Visit: Payer: Managed Care, Other (non HMO) | Attending: Internal Medicine | Admitting: Internal Medicine

## 2023-09-13 ENCOUNTER — Encounter: Payer: Self-pay | Admitting: Internal Medicine

## 2023-09-13 VITALS — BP 118/83 | HR 62 | Wt 245.7 lb

## 2023-09-13 DIAGNOSIS — E66813 Obesity, class 3: Secondary | ICD-10-CM

## 2023-09-13 DIAGNOSIS — Z0001 Encounter for general adult medical examination with abnormal findings: Secondary | ICD-10-CM | POA: Diagnosis not present

## 2023-09-13 DIAGNOSIS — I10 Essential (primary) hypertension: Secondary | ICD-10-CM | POA: Diagnosis not present

## 2023-09-13 DIAGNOSIS — Z7985 Long-term (current) use of injectable non-insulin antidiabetic drugs: Secondary | ICD-10-CM

## 2023-09-13 DIAGNOSIS — Z0000 Encounter for general adult medical examination without abnormal findings: Secondary | ICD-10-CM

## 2023-09-13 DIAGNOSIS — F330 Major depressive disorder, recurrent, mild: Secondary | ICD-10-CM | POA: Diagnosis not present

## 2023-09-13 DIAGNOSIS — Z6841 Body Mass Index (BMI) 40.0 and over, adult: Secondary | ICD-10-CM

## 2023-09-13 MED ORDER — TIRZEPATIDE 2.5 MG/0.5ML ~~LOC~~ SOAJ
2.5000 mg | SUBCUTANEOUS | 0 refills | Status: DC
Start: 2023-09-13 — End: 2024-03-12
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
## 2023-09-13 NOTE — Patient Instructions (Signed)
We will start you on Mounjaro 2.5 mg once a week.  Please stop the medicine if you develop any vomiting, abdominal pain, severe diarrhea/constipation.  Tirzepatide Injection Greggory Keen) What is this medication? TIRZEPATIDE (tir ZEP a tide) treats type 2 diabetes. It works by increasing insulin levels in your body, which decreases your blood sugar (glucose). It also reduces the amount of sugar released into your blood and slows down your digestion. Changes to diet and exercise are often combined with this medication. This medicine may be used for other purposes; ask your health care provider or pharmacist if you have questions. COMMON BRAND NAME(S): MOUNJARO What should I tell my care team before I take this medication? They need to know if you have any of these conditions: Eye disease caused by diabetes Gallbladder disease Have or have had pancreatitis Having surgery Kidney disease Personal or family history of MEN 2, a condition that causes endocrine gland tumors Personal or family history of thyroid cancer Stomach or intestine problems, such as problems digesting food An unusual or allergic reaction to tirzepatide, other medications, foods, dyes, or preservatives Pregnant or trying to get pregnant Breastfeeding How should I use this medication? This medication is injected under the skin. You will be taught how to prepare and give it. It is given once every week (every 7 days). Keep taking it unless your health care provider tells you to stop. If you use this medication with insulin, you should inject this medication and the insulin separately. Do not mix them together. Do not give the injections right next to each other. Change (rotate) injection sites with each injection. This medication comes with INSTRUCTIONS FOR USE. Ask your pharmacist for directions on how to use this medication. Read the information carefully. Talk to your pharmacist or care team if you have questions. It is important  that you put your used needles and syringes in a special sharps container. Do not put them in a trash can. If you do not have a sharps container, call your pharmacist or care team to get one. A special MedGuide will be given to you by the pharmacist with each prescription and refill. Be sure to read this information carefully each time. Talk to your care team about the use of this medication in children. Special care may be needed. Overdosage: If you think you have taken too much of this medicine contact a poison control center or emergency room at once. NOTE: This medicine is only for you. Do not share this medicine with others. What if I miss a dose? If you miss a dose, take it as soon as you can unless it is more than 4 days (96 hours) late. If it is more than 4 days late, skip the missed dose. Take the next dose at the normal time. Do not take 2 doses within 3 days of each other. What may interact with this medication? Alcohol Antiviral medications for HIV or AIDS Aspirin and aspirin-like medications Beta blockers, such as atenolol, metoprolol, propranolol Certain medications for blood pressure, heart disease, irregular heart beat Chromium Clonidine Diuretics Estrogen or progestin hormones Fenofibrate Gemfibrozil Guanethidine Isoniazid Lanreotide Female hormones or anabolic steroids MAOIs, such as Marplan, Nardil, and Parnate Medications for weight loss Medications for allergies, asthma, cold, or cough Medications for depression, anxiety, or mental health conditions Niacin Nicotine NSAIDs, medications for pain and inflammation, such as ibuprofen or naproxen Octreotide Other medications for diabetes, such as glyburide, glipizide, or glimepiride Pasireotide Pentamidine Phenytoin Probenecid Quinolone antibiotics, such as  ciprofloxacin, levofloxacin, ofloxacin Reserpine Some herbal dietary supplements Steroid medications, such as prednisone or cortisone Sulfamethoxazole;  trimethoprim Thyroid hormones Warfarin This list may not describe all possible interactions. Give your health care provider a list of all the medicines, herbs, non-prescription drugs, or dietary supplements you use. Also tell them if you smoke, drink alcohol, or use illegal drugs. Some items may interact with your medicine. What should I watch for while using this medication? Visit your care team for regular checks on your progress. Tell your care team if your symptoms do not start to get better or if they get worse. You may need blood work done while you are taking this medication. Your care team will monitor your HbA1C (A1C). This test shows what your average blood sugar (glucose) level was over the past 2 to 3 months. Know the symptoms of low blood sugar and know how to treat it. Always carry a source of quick sugar with you. Examples include hard sugar candy or glucose tablets. Make sure others know that you can choke if you eat or drink if your blood sugar is too low and you are unable to care for yourself. Get medical help at once. Tell your care team if you have high blood sugar. Your medication dose may change if your body is under stress. Some types of stress that may affect your blood sugar include fever, infection, and surgery. Do not share pens or cartridges with anyone, even if the needle is changed. Each pen should only be used by one person. Sharing could cause an infection. Wear a medical ID bracelet or chain. Carry a card that describes your condition. List the medications and doses you take on the card. Talk to your care team about your risk of cancer. You may be more at risk for certain types of cancer if you take this medication. Talk to your care team right away if you have a lump or swelling in your neck, hoarseness that does not go away, trouble swallowing, shortness of breath, or trouble breathing. Make sure you stay hydrated while taking this medication. Drink water often. Eat  fruits and veggies that have a high water content. Drink more water when it is hot or you are active. Talk to your care team right away if you have fever, infection, vomiting, diarrhea, or if you sweat a lot while taking this medication. The loss of too much body fluid may make it dangerous for you to take this medication. If you are going to need surgery or a procedure, tell your care team that you are taking this medication. Estrogen and progestin hormones that you take by mouth may not work as well while you are taking this medication. Switch to a non-oral contraceptive or add a barrier contraceptive for 4 weeks after starting this medication and after each dose increase. Talk to your care team about contraceptive options. They can help you find the option that works for you. What side effects may I notice from receiving this medication? Side effects that you should report to your care team as soon as possible: Allergic reactions or angioedema--skin rash, itching or hives, swelling of the face, eyes, lips, tongue, arms, or legs, trouble swallowing or breathing Change in vision Dehydration--increased thirst, dry mouth, feeling faint or lightheaded, headache, dark yellow or brown urine Gallbladder problems--severe stomach pain, nausea, vomiting, fever Kidney injury--decrease in the amount of urine, swelling of the ankles, hands, or feet Pancreatitis--severe stomach pain that spreads to your back or gets  worse after eating or when touched, fever, nausea, vomiting Thoughts of suicide or self-harm, worsening mood, feelings of depression Thyroid cancer--new mass or lump in the neck, pain or trouble swallowing, trouble breathing, hoarseness Side effects that usually do not require medical attention (report these to your care team if they continue or are bothersome): Diarrhea Loss of appetite Nausea Upset stomach This list may not describe all possible side effects. Call your doctor for medical advice  about side effects. You may report side effects to FDA at 1-800-FDA-1088. Where should I keep my medication? Keep out of the reach of children and pets. Refrigeration (preferred): Store in the refrigerator. Keep this medication in the original carton until you are ready to take it. Do not freeze. Protect from light. Get rid of opened vials after use, even if there is medication left. Get rid of any unopened vials or pens after the expiration date. Room Temperature: This medication may be stored at room temperature below 30 degrees C (86 degrees F) for up to 21 days. Keep this medication in the original carton until you are ready to take it. Protect from light. Avoid exposure to extreme heat. Get rid of opened vials after use, even if there is medication left. Get rid of any unopened vials or pens after 21 days, or after they expire, whichever is first. To get rid of medications that are no longer needed or have expired: Take the medication to a medication take-back program. Check with your pharmacy or law enforcement to find a location. If you cannot return the medication, ask your pharmacist or care team how to get rid of this medication safely. NOTE: This sheet is a summary. It may not cover all possible information. If you have questions about this medicine, talk to your doctor, pharmacist, or health care provider.  2024 Elsevier/Gold Standard (2023-07-15 00:00:00)Preventive Care 59-62 Years Old, Female Preventive care refers to lifestyle choices and visits with your health care provider that can promote health and wellness. Preventive care visits are also called wellness exams. What can I expect for my preventive care visit? Counseling Your health care provider may ask you questions about your: Medical history, including: Past medical problems. Family medical history. Pregnancy history. Current health, including: Menstrual cycle. Method of birth control. Emotional well-being. Home life and  relationship well-being. Sexual activity and sexual health. Lifestyle, including: Alcohol, nicotine or tobacco, and drug use. Access to firearms. Diet, exercise, and sleep habits. Work and work Astronomer. Sunscreen use. Safety issues such as seatbelt and bike helmet use. Physical exam Your health care provider will check your: Height and weight. These may be used to calculate your BMI (body mass index). BMI is a measurement that tells if you are at a healthy weight. Waist circumference. This measures the distance around your waistline. This measurement also tells if you are at a healthy weight and may help predict your risk of certain diseases, such as type 2 diabetes and high blood pressure. Heart rate and blood pressure. Body temperature. Skin for abnormal spots. What immunizations do I need?  Vaccines are usually given at various ages, according to a schedule. Your health care provider will recommend vaccines for you based on your age, medical history, and lifestyle or other factors, such as travel or where you work. What tests do I need? Screening Your health care provider may recommend screening tests for certain conditions. This may include: Lipid and cholesterol levels. Diabetes screening. This is done by checking your blood sugar (glucose) after  you have not eaten for a while (fasting). Pelvic exam and Pap test. Hepatitis B test. Hepatitis C test. HIV (human immunodeficiency virus) test. STI (sexually transmitted infection) testing, if you are at risk. Lung cancer screening. Colorectal cancer screening. Mammogram. Talk with your health care provider about when you should start having regular mammograms. This may depend on whether you have a family history of breast cancer. BRCA-related cancer screening. This may be done if you have a family history of breast, ovarian, tubal, or peritoneal cancers. Bone density scan. This is done to screen for osteoporosis. Talk with your  health care provider about your test results, treatment options, and if necessary, the need for more tests. Follow these instructions at home: Eating and drinking  Eat a diet that includes fresh fruits and vegetables, whole grains, lean protein, and low-fat dairy products. Take vitamin and mineral supplements as recommended by your health care provider. Do not drink alcohol if: Your health care provider tells you not to drink. You are pregnant, may be pregnant, or are planning to become pregnant. If you drink alcohol: Limit how much you have to 0-1 drink a day. Know how much alcohol is in your drink. In the [place] one drink equals one 12 oz bottle of beer (355 mL), one 5 oz glass of wine (148 mL), or one 1 oz glass of hard liquor (44 mL). Lifestyle Brush your teeth every morning and night with fluoride toothpaste. Floss one time each day. Exercise for at least 30 minutes 5 or more days each week. Do not use any products that contain nicotine or tobacco. These products include cigarettes, chewing tobacco, and vaping devices, such as e-cigarettes. If you need help quitting, ask your health care provider. Do not use drugs. If you are sexually active, practice safe sex. Use a condom or other form of protection to prevent STIs. If you do not wish to become pregnant, use a form of birth control. If you plan to become pregnant, see your health care provider for a prepregnancy visit. Take aspirin only as told by your health care provider. Make sure that you understand how much to take and what form to take. Work with your health care provider to find out whether it is safe and beneficial for you to take aspirin daily. Find healthy ways to manage stress, such as: Meditation, yoga, or listening to music. Journaling. Talking to a trusted person. Spending time with friends and family. Minimize exposure to UV radiation to reduce your risk of skin cancer. Safety Always wear your seat belt while driving  or riding in a vehicle. Do not drive: If you have been drinking alcohol. Do not ride with someone who has been drinking. When you are tired or distracted. While texting. If you have been using any mind-altering substances or drugs. Wear a helmet and other protective equipment during sports activities. If you have firearms in your house, make sure you follow all gun safety procedures. Seek help if you have been physically or sexually abused. What's next? Visit your health care provider once a year for an annual wellness visit. Ask your health care provider how often you should have your eyes and teeth checked. Stay up to date on all vaccines. This information is not intended to replace advice given to you by your health care provider. Make sure you discuss any questions you have with your health care provider. Document Revised: 01/28/2021 Document Reviewed: 01/28/2021 Elsevier Patient Education  2024 [employer].

## 2023-09-14 ENCOUNTER — Other Ambulatory Visit: Payer: Self-pay | Admitting: Internal Medicine

## 2023-09-14 ENCOUNTER — Encounter: Payer: Self-pay | Admitting: Internal Medicine

## 2023-09-14 DIAGNOSIS — L853 Xerosis cutis: Secondary | ICD-10-CM

## 2023-09-14 DIAGNOSIS — I10 Essential (primary) hypertension: Secondary | ICD-10-CM

## 2023-09-14 LAB — LIPID PANEL
Chol/HDL Ratio: 2.2 {ratio} (ref 0.0–4.4)
Cholesterol, Total: 166 mg/dL (ref 100–199)
HDL: 77 mg/dL (ref >39)
LDL Chol Calc (NIH): 75 mg/dL (ref 0–99)
Triglycerides: 74 mg/dL (ref 0–149)
VLDL Cholesterol Cal: 14 mg/dL (ref 5–40)

## 2023-09-14 LAB — CBC
Hematocrit: 37.9 % (ref 34.0–46.6)
Hemoglobin: 12.5 g/dL (ref 11.1–15.9)
MCH: 30.2 pg (ref 26.6–33.0)
MCHC: 33 g/dL (ref 31.5–35.7)
MCV: 92 fL (ref 79–97)
Platelets: 311 10*3/uL (ref 150–450)
RBC: 4.14 x10E6/uL (ref 3.77–5.28)
RDW: 12.7 % (ref 11.7–15.4)
WBC: 5.4 10*3/uL (ref 3.4–10.8)

## 2023-09-14 LAB — COMPREHENSIVE METABOLIC PANEL
ALT: 14 [IU]/L (ref 0–32)
AST: 22 [IU]/L (ref 0–40)
Albumin: 4.2 g/dL (ref 3.9–4.9)
Alkaline Phosphatase: 81 [IU]/L (ref 44–121)
BUN/Creatinine Ratio: 18 (ref 9–23)
BUN: 14 mg/dL (ref 6–24)
Bilirubin Total: <0.2 mg/dL (ref 0.0–1.2)
CO2: 28 mmol/L (ref 20–29)
Calcium: 8.9 mg/dL (ref 8.7–10.2)
Chloride: 101 mmol/L (ref 96–106)
Creatinine, Ser: 0.79 mg/dL (ref 0.57–1.00)
Globulin, Total: 2.4 g/dL (ref 1.5–4.5)
Glucose: 107 mg/dL — ABNORMAL HIGH (ref 70–99)
Potassium: 4 mmol/L (ref 3.5–5.2)
Sodium: 142 mmol/L (ref 134–144)
Total Protein: 6.6 g/dL (ref 6.0–8.5)
eGFR: 96 mL/min/{1.73_m2} (ref >59)

## 2023-09-16 ENCOUNTER — Telehealth: Payer: Self-pay

## 2023-09-16 ENCOUNTER — Other Ambulatory Visit: Payer: Self-pay

## 2023-09-16 NOTE — Telephone Encounter (Signed)
Pharmacy Patient Advocate Encounter   Received notification from CoverMyMeds that prior authorization for [facility] is required/requested.   Insurance verification completed.   The patient is insured through [employer] .   Per test claim: PA required; PA submitted to above mentioned insurance via CoverMyMeds Key/confirmation #/[id number] Status is pending

## 2023-09-20 ENCOUNTER — Other Ambulatory Visit: Payer: Self-pay

## 2023-09-21 ENCOUNTER — Other Ambulatory Visit: Payer: Self-pay

## 2023-09-22 ENCOUNTER — Other Ambulatory Visit: Payer: Self-pay | Admitting: Neurology

## 2023-09-27 ENCOUNTER — Other Ambulatory Visit: Payer: Self-pay | Admitting: Neurology

## 2023-09-30 ENCOUNTER — Encounter: Payer: Self-pay | Admitting: Internal Medicine

## 2023-10-01 ENCOUNTER — Other Ambulatory Visit: Payer: Self-pay | Admitting: Internal Medicine

## 2023-10-01 MED ORDER — VERAPAMIL HCL 80 MG PO TABS: 80.0000 mg | ORAL_TABLET | Freq: Three times a day (TID) | ORAL | 1 refills | Status: DC

## 2023-11-01 ENCOUNTER — Other Ambulatory Visit: Payer: Self-pay | Admitting: Internal Medicine

## 2023-11-01 ENCOUNTER — Encounter: Payer: Self-pay | Admitting: Internal Medicine

## 2023-11-01 MED ORDER — ALBUTEROL SULFATE HFA 108 (90 BASE) MCG/ACT IN AERS: INHALATION_SPRAY | RESPIRATORY_TRACT | 3 refills | Status: AC

## 2023-11-09 ENCOUNTER — Other Ambulatory Visit: Payer: Self-pay | Admitting: Internal Medicine

## 2023-11-09 DIAGNOSIS — L509 Urticaria, unspecified: Secondary | ICD-10-CM

## 2023-11-09 DIAGNOSIS — J302 Other seasonal allergic rhinitis: Secondary | ICD-10-CM

## 2023-11-09 MED ORDER — OLOPATADINE HCL 0.2 % OP SOLN: OPHTHALMIC | 1 refills | Status: DC

## 2023-11-21 ENCOUNTER — Other Ambulatory Visit: Payer: Self-pay | Admitting: Internal Medicine

## 2023-11-22 NOTE — Telephone Encounter (Signed)
 Requested Prescriptions  Refused Prescriptions Disp Refills   propranolol (INDERAL) 10 MG tablet [Pharmacy Med Name: PROPRANOLOL 10 MG TABLET] 180 tablet 1    Sig: TAKE 1 TABLET BY MOUTH TWICE A DAY AS NEEDED     Cardiovascular:  Beta Blockers Passed - 11/22/2023 11:38 AM      Passed - Last BP in normal range    BP Readings from Last 1 Encounters:  09/13/23 118/83         Passed - Last Heart Rate in normal range    Pulse Readings from Last 1 Encounters:  09/13/23 62         Passed - Valid encounter within last 6 months    Recent Outpatient Visits           2 months ago Annual physical exam   [facility] [place] [facility] [place]. [facility] Marcine Matar, MD   1 year ago Essential hypertension   [facility] [place] [facility] [place]. [facility] Jonah Blue B, MD   1 year ago Class 2 severe obesity with serious comorbidity and body mass index (BMI) of 37.0 to 37.9 in adult, unspecified obesity type [facility])   [facility] [place] [facility] [place]. [facility] Marcine Matar, MD   1 year ago Viral respiratory illness   [facility] - A Dept Of [place]. [facility] Marcine Matar, MD   1 year ago Essential hypertension   [facility] [place] - [facility] Dept Of [place]. [facility] Marcine Matar, MD       Future Appointments             In 3 months Laural Benes Binnie Rail, MD [facility] [place] - A Dept Of Eligha Bridegroom. [facility]

## 2023-11-25 ENCOUNTER — Encounter: Payer: Self-pay | Admitting: Internal Medicine

## 2023-12-09 ENCOUNTER — Other Ambulatory Visit: Payer: Self-pay | Admitting: Internal Medicine

## 2023-12-21 ENCOUNTER — Other Ambulatory Visit: Payer: Self-pay | Admitting: Internal Medicine

## 2024-01-02 ENCOUNTER — Other Ambulatory Visit: Payer: Self-pay | Admitting: Internal Medicine

## 2024-01-20 ENCOUNTER — Ambulatory Visit

## 2024-03-04 ENCOUNTER — Other Ambulatory Visit: Payer: Self-pay | Admitting: Internal Medicine

## 2024-03-04 DIAGNOSIS — I10 Essential (primary) hypertension: Secondary | ICD-10-CM

## 2024-03-09 ENCOUNTER — Telehealth: Payer: Self-pay | Admitting: Internal Medicine

## 2024-03-09 NOTE — Telephone Encounter (Signed)
 Confirmed appt for 7/28

## 2024-03-12 ENCOUNTER — Encounter: Payer: Self-pay | Admitting: Internal Medicine

## 2024-03-12 ENCOUNTER — Ambulatory Visit: Payer: Managed Care, Other (non HMO) | Attending: Internal Medicine | Admitting: Internal Medicine

## 2024-03-12 VITALS — BP 109/75 | HR 70 | Ht 65.0 in | Wt 252.0 lb

## 2024-03-12 DIAGNOSIS — I10 Essential (primary) hypertension: Secondary | ICD-10-CM

## 2024-03-12 DIAGNOSIS — Z6841 Body Mass Index (BMI) 40.0 and over, adult: Secondary | ICD-10-CM

## 2024-03-12 DIAGNOSIS — F32A Depression, unspecified: Secondary | ICD-10-CM

## 2024-03-12 DIAGNOSIS — F418 Other specified anxiety disorders: Secondary | ICD-10-CM

## 2024-03-12 DIAGNOSIS — G43909 Migraine, unspecified, not intractable, without status migrainosus: Secondary | ICD-10-CM | POA: Diagnosis not present

## 2024-03-12 DIAGNOSIS — Z6379 Other stressful life events affecting family and household: Secondary | ICD-10-CM

## 2024-03-12 MED ORDER — PHENTERMINE HCL 30 MG PO CAPS
30.0000 mg | ORAL_CAPSULE | ORAL | 1 refills | Status: DC
Start: 2024-03-12 — End: 2024-05-09

## 2024-03-12 MED ORDER — RIZATRIPTAN BENZOATE 10 MG PO TBDP: ORAL_TABLET | ORAL | 1 refills | Status: DC

## 2024-03-12 MED ORDER — TOPIRAMATE 25 MG PO TABS
25.0000 mg | ORAL_TABLET | Freq: Every day | ORAL | 1 refills | Status: DC
Start: 2024-03-12 — End: 2024-05-07

## 2024-03-12 MED ORDER — EPINEPHRINE 0.3 MG/0.3ML IJ SOAJ: 0.3000 mg | INTRAMUSCULAR | 0 refills | Status: DC | PRN
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
## 2024-03-12 NOTE — Patient Instructions (Signed)
 VISIT SUMMARY:  During your visit, we discussed your recent weight gain and stress-related issues. We reviewed your current medications and made some adjustments to help manage your weight, migraines, and overall stress levels. We also talked about your ongoing management of hypertension, depression, and anxiety.  YOUR PLAN:  -WEIGHT GAIN: Your weight gain is likely due to stress and lack of exercise. We have prescribed phentermine  30 mg to take in the morning to help with weight management. Additionally, we have prescribed Topamax  25 mg to take at bedtime to enhance the efficacy of the weight management plan. Please monitor your blood pressure three times a week for the next two to three weeks and watch for any palpitations.  -MIGRAINE: Migraines are often triggered by stress. We have prescribed Topamax  25 mg at bedtime to help reduce the frequency of your migraines. Continue taking verapamil  as before. For acute relief, you can use Maxalt  and repeat the dose in two hours if needed.  -HYPERTENSION: Your high blood pressure is well-controlled with your current medications. Continue taking valsartan  40 mg and hydrochlorothiazide  25 mg as prescribed.  -DEPRESSION AND ANXIETY: Your depression and anxiety are being managed with therapy and medication. Continue taking Zoloft and Buspar as prescribed, and keep up with your monthly therapy and psychiatric appointments.  -GENERAL HEALTH MAINTENANCE: You are due for a mammogram next month. Please ensure that it is scheduled through [facility].  INSTRUCTIONS:  Please monitor your blood pressure three times a week for the next two to three weeks and watch for any palpitations. Ensure that your mammogram is scheduled through [facility]. Continue with your current medications and therapy appointments as discussed.

## 2024-03-14 ENCOUNTER — Encounter: Payer: Self-pay | Admitting: Internal Medicine

## 2024-03-14 DIAGNOSIS — Z9109 Other allergy status, other than to drugs and biological substances: Secondary | ICD-10-CM

## 2024-03-14 MED ORDER — MONTELUKAST SODIUM 10 MG PO TABS: 10.0000 mg | ORAL_TABLET | Freq: Every day | ORAL | 1 refills | Status: DC

## 2024-03-30 LAB — HM MAMMOGRAPHY

## 2024-05-06 ENCOUNTER — Other Ambulatory Visit: Payer: Self-pay | Admitting: Internal Medicine

## 2024-05-06 DIAGNOSIS — G43909 Migraine, unspecified, not intractable, without status migrainosus: Secondary | ICD-10-CM

## 2024-05-08 ENCOUNTER — Ambulatory Visit: Admitting: Orthopaedic Surgery

## 2024-05-08 ENCOUNTER — Other Ambulatory Visit: Payer: Self-pay | Admitting: Internal Medicine

## 2024-05-08 DIAGNOSIS — Z6841 Body Mass Index (BMI) 40.0 and over, adult: Secondary | ICD-10-CM

## 2024-05-16 NOTE — Progress Notes (Signed)
 [facility] Psychotherapy Note  05/16/2024  Carla Buck, a 43 y.o. female, for office visit.   Referral Source: No care team member to display  No diagnosis found.             History:  Past Psychiatric History   Tobacco Use History[1] Social History   Substance and Sexual Activity  Alcohol Use Not Currently   Social History   Substance and Sexual Activity  Drug Use Never   Comment: Drug use: Denies   E-cigarette/Vaping Substances   E-cigarette/Vaping                               Current Medications: Scheduled Meds:  Continuous Infusions: No current facility-administered medications for this visit.  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 She remains NED. She shares that her husband and now her son have both been sick, however she states she has remained healthy. Carla Buck notes that since school has started she has been going to the gym 5 days a week. She has been focusing on healthy habits with regard to exercise and nutrition. She and her family have started intensive home therapy twice a week to work with issue with her son. She is happy to report that it has been working and has provided some helpful tools. Carla Buck describes her husband as supportive. She has been enjoying her work describing it both as meaningful and energizing as she sees opportunities for continued progress and met goals.   Assessment/Plan: Carla Buck is easy to engage. She presents with good insight she has been exercising regularly, starting day with positive affirmation. She remains goal directed and has been mindful of communication, balance and continued self care in coping with anxiety/stress triggers. Plan today is reinforcement around growth and goals along with continued emphasis on self care, communication with self and loved ones and ongoing strategies for  implementing healthy balance and boundaries. She responds well to in session therapy   Psychosocial Intervention:  Supportive therapy. Self care      No orders of the defined types were placed in this encounter.   Ronal FORBES Bihari, LCSW                  [1] Social History Tobacco Use  Smoking Status Never  Smokeless Tobacco Never  [2] Patient Active Problem List Diagnosis  . Generalized anxiety disorder  . Panic disorder  . Trauma and stressor-related disorder  . Insomnia  . Allergic asthma (CMD)  . Dysmenorrhea  . Fibroids, intramural  . Menorrhagia  . Obesity, unspecified  . Abnormal liver enzymes  . Benign neoplasm  . Discharge from nipple  . Intestinal malabsorption  . Intraductal papilloma of breast  . At high risk for breast cancer  . Sensorineural hearing loss, bilateral

## 2024-05-17 ENCOUNTER — Encounter: Payer: Self-pay | Admitting: Internal Medicine

## 2024-05-17 ENCOUNTER — Other Ambulatory Visit: Payer: Self-pay | Admitting: Family Medicine

## 2024-05-17 DIAGNOSIS — L308 Other specified dermatitis: Secondary | ICD-10-CM

## 2024-05-22 ENCOUNTER — Ambulatory Visit: Attending: Internal Medicine | Admitting: Internal Medicine

## 2024-05-22 VITALS — BP 128/84 | HR 65 | Temp 98.2°F | Ht 65.0 in | Wt 241.1 lb

## 2024-05-22 DIAGNOSIS — Z6379 Other stressful life events affecting family and household: Secondary | ICD-10-CM | POA: Diagnosis not present

## 2024-05-22 DIAGNOSIS — G43909 Migraine, unspecified, not intractable, without status migrainosus: Secondary | ICD-10-CM | POA: Diagnosis not present

## 2024-05-22 DIAGNOSIS — Z6841 Body Mass Index (BMI) 40.0 and over, adult: Secondary | ICD-10-CM

## 2024-05-22 DIAGNOSIS — D241 Benign neoplasm of right breast: Secondary | ICD-10-CM

## 2024-05-22 DIAGNOSIS — I10 Essential (primary) hypertension: Secondary | ICD-10-CM

## 2024-05-22 DIAGNOSIS — D242 Benign neoplasm of left breast: Secondary | ICD-10-CM

## 2024-05-22 MED ORDER — TOPIRAMATE 50 MG PO TABS: 50.0000 mg | ORAL_TABLET | Freq: Every evening | ORAL | 2 refills | Status: DC

## 2024-05-22 MED ORDER — PHENTERMINE HCL 37.5 MG PO CAPS: 37.5000 mg | ORAL_CAPSULE | Freq: Every day | ORAL | 1 refills | Status: DC

## 2024-05-22 NOTE — Patient Instructions (Addendum)
 Your dermatology referral was sent to [facility]  Ph# [phone]  Address [address]    VISIT SUMMARY: During today's visit, we discussed your progress with weight management, migraine treatment, blood pressure control, and stress management. You have made significant strides in your health, including weight loss and better stress management, and we have adjusted your medications to further support your progress.  YOUR PLAN: -MORBID OBESITY DUE TO EXCESS CALORIES: Morbid obesity is a condition where excess body fat negatively affects your health. You have lost 11 pounds since July, which is excellent progress. We will increase your phentermine  dose to 37.5 mg after you finish your current supply. Continue your current exercise and dietary regimen, monitor for palpitations, avoid taking phentermine  with coffee, and check your blood pressure weekly, reporting the readings via MyChart.  -ESSENTIAL HYPERTENSION: Essential hypertension is high blood pressure with no identifiable cause. Your blood pressure is generally well-controlled, but stress can cause fluctuations. Continue taking Diovan  (valsartan ) 40 mg and hydrochlorothiazide  25 mg. Check your blood pressure weekly and report the readings via MyChart. We will also order blood tests to check your potassium levels and kidney function.  -MIGRAINE: Migraines are severe headaches often accompanied by other symptoms like nausea and sensitivity to light. Maxalt  has been effective for your acute episodes. We will increase your Topamax  dose to 50 mg at bedtime. Monitor for any tingling or numbness in your extremities and report if it occurs. Continue using Maxalt  for acute migraine management.  -DEPRESSION: Depression is a mood disorder characterized by persistent feelings of sadness and loss of interest. Your mood and stress levels have improved since July due to exercise and therapy. Continue taking your current medications: Buspar,  sertraline, and propranolol as needed. Also, continue with your therapy sessions.  INSTRUCTIONS: Please follow up with weekly blood pressure readings and report them via MyChart. We will also need to conduct blood tests to check your potassium levels and kidney function. Continue with your current exercise and dietary regimen, and monitor for any new symptoms or side effects from your medications.                      Contains text generated by [employer]bridge.                                 Contains text generated by Abridge.
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
## 2024-05-23 ENCOUNTER — Ambulatory Visit: Payer: Self-pay | Admitting: Internal Medicine

## 2024-05-23 LAB — BASIC METABOLIC PANEL WITH GFR
BUN/Creatinine Ratio: 15 (ref 9–23)
BUN: 13 mg/dL (ref 6–24)
CO2: 25 mmol/L (ref 20–29)
Calcium: 9.2 mg/dL (ref 8.7–10.2)
Chloride: 97 mmol/L (ref 96–106)
Creatinine, Ser: 0.86 mg/dL (ref 0.57–1.00)
Glucose: 98 mg/dL (ref 70–99)
Potassium: 4 mmol/L (ref 3.5–5.2)
Sodium: 136 mmol/L (ref 134–144)
eGFR: 87 mL/min/1.73 (ref >59)

## 2024-06-06 ENCOUNTER — Encounter: Payer: Self-pay | Admitting: Internal Medicine

## 2024-06-15 ENCOUNTER — Encounter: Payer: Self-pay | Admitting: Internal Medicine

## 2024-06-16 ENCOUNTER — Other Ambulatory Visit: Payer: Self-pay | Admitting: Internal Medicine

## 2024-06-18 ENCOUNTER — Encounter: Payer: Self-pay | Admitting: Radiology

## 2024-06-18 NOTE — Telephone Encounter (Signed)
 Requested Prescriptions  Pending Prescriptions Disp Refills   acyclovir  (ZOVIRAX ) 400 MG tablet [Pharmacy Med Name: ACYCLOVIR  400 MG TABLET] 180 tablet 0    Sig: TAKE 1 TABLET BY MOUTH TWICE A DAY     Antimicrobials:  Antiviral Agents - Anti-Herpetic Passed - 06/18/2024  4:29 PM      Passed - Valid encounter within last 12 months    Recent Outpatient Visits           3 weeks ago Morbid obesity with body mass index (BMI) of 40.0 to 44.9 in adult [facility])   [facility] [place]. [facility] Vicci Sober B, MD   3 months ago Morbid obesity with body mass index (BMI) of 40.0 to 44.9 in adult [facility])   [facility] Dept Of [place]. [facility] Vicci Sober NOVAK, MD   9 months ago Annual physical exam   [facility] Dept Of [place]. [facility] Vicci Sober NOVAK, MD   1 year ago Essential hypertension   [place] [facility] Of [place]. [facility] Vicci Sober B, MD   1 year ago Class 2 severe obesity with serious comorbidity and body mass index (BMI) of 37.0 to 37.9 in adult, unspecified obesity type    [facility] [place]. [facility] Vicci Sober NOVAK, MD       Future Appointments             In 2 weeks Raymund, Lauraine BROCKS, MD [facility]

## 2024-06-21 ENCOUNTER — Other Ambulatory Visit: Payer: Self-pay | Admitting: Internal Medicine

## 2024-06-30 ENCOUNTER — Encounter: Payer: Self-pay | Admitting: Internal Medicine

## 2024-06-30 ENCOUNTER — Other Ambulatory Visit: Payer: Self-pay | Admitting: Internal Medicine

## 2024-06-30 DIAGNOSIS — G43909 Migraine, unspecified, not intractable, without status migrainosus: Secondary | ICD-10-CM

## 2024-07-05 ENCOUNTER — Ambulatory Visit

## 2024-07-05 ENCOUNTER — Other Ambulatory Visit: Payer: Self-pay

## 2024-07-05 DIAGNOSIS — L308 Other specified dermatitis: Secondary | ICD-10-CM

## 2024-07-05 DIAGNOSIS — L2089 Other atopic dermatitis: Secondary | ICD-10-CM | POA: Diagnosis not present

## 2024-07-05 MED ORDER — OPZELURA 1.5 % EX CREA: TOPICAL_CREAM | CUTANEOUS | 0 refills | Status: DC

## 2024-07-05 MED ORDER — TRIAMCINOLONE ACETONIDE 0.1 % EX OINT: TOPICAL_OINTMENT | CUTANEOUS | 2 refills | Status: DC

## 2024-07-05 MED ORDER — CLOBETASOL PROPIONATE 0.05 % EX OINT
TOPICAL_OINTMENT | CUTANEOUS | 5 refills | Status: AC
Start: 2024-07-05 — End: ?

## 2024-07-05 MED ORDER — TACROLIMUS 0.1 % EX OINT: TOPICAL_OINTMENT | CUTANEOUS | 5 refills | Status: AC

## 2024-07-05 NOTE — Progress Notes (Signed)
    Subjective   Carla Buck is a 43 y.o. female who presents for the following: Rash. Patient is new patient  Today patient reports: Rash on the face and plantar feet. Patient has a hx of Eczema that has been treated with Eucrisa  and Triamcinolone  in the past; she states these are not working well in these areas.   Review of Systems:    No other skin or systemic complaints except as noted in HPI or Assessment and Plan.  The following portions of the chart were reviewed this encounter and updated as appropriate: medications, allergies, medical history  Relevant Medical History:  n/a   Objective  (SKPE) Well appearing patient in no apparent distress; mood and affect are within normal limits. Examination was performed of the: Focused Exam of: Face and hands    Examination notable for: Atopic dermatitis: Diffuse xerosis with erythematous plaques on the on the trunk and extremities, most prominently in the flexural areas with moderate lichenification, erythema, and pigment alteration focally  Examination limited by: Clothing and Patient deferred removal       Assessment & Plan  (SKAP)   Atopic dermatitis - mild, BSA 2-5%, flaring  Hand dermatitis  Chronic and persistent condition with duration or expected duration over one year. Condition is symptomatic and bothersome to patient. Patient is flaring and not currently at treatment goal.   - Patient has previously tried and failed Eucrisa   - Diagnosis, treatment options, prognosis, risk/ benefit, and side effects of treatment were discussed with the patient.  - Reviewed benign but chronic nature of disease. - Discussed dry skin care at length, recommended avoidance of fragrances, short showers with luke- warm water, no scrubbing, an unscented moisturizing soap (e.g. Dove sensitive skin) limited to the groin and axillae, and frequent emollient use (Eucerin, Aquaphor, Cerave, Vanicream, Vaseline). - Discussed treatment with  topical steroids, non steroidal topicals, systemics (dupixent, tralokinumab, nemolizumab, rinvoq)   - Cyclosporine, mycophenolate, azathioprine, methotrexate are older medications but can be considered  - Reviewed proper use of topical steroids to minimize the risk of steroid-induced skin changes.  - Also discussed appropriate dry skin care including daily warm baths with gentle soap, followed by liberal bland moisturizer application.  - A patient education handout reiterating this information was provided. - For mild areas: continue triamcinolone  0.1% ointment twice daily as needed  - For severe areas: start clobetasol  0.5% ointment twice daily as needed - For areas on face: start tacrolimus  0.1 ointment twice daily as needed - Start Opzelura  1.5% cream apply topically twice daily to affected areas of the face   Has had anaphylactic reaction to xolair in the past  Level of service outlined above   Patient instructions (SKPI)   Procedures, orders, diagnosis for this visit:    There are no diagnoses linked to this encounter.  Return to clinic: Return in about 3 months (around 10/05/2024) for Atopic derm.  I, Emerick Ege, CMA am acting as scribe for Lauraine JAYSON Kanaris, MD.   Documentation: I have reviewed the above documentation for accuracy and completeness, and I agree with the above.  Lauraine JAYSON Kanaris, MD

## 2024-07-05 NOTE — Patient Instructions (Addendum)
 Atopic Dermatitis (Eczema)  PLAN: - Apply medicated ointment/cream to active areas (red/itchy/raised) 2x/day until CLEAR, then stop, restart as needed.  - For mild areas: start triamcinolone  ointment twice daily - For severe areas: start clobetasol ointment twice daily - For areas on face: start tacrolimus ointment twice daily on the face *discontinue once Opzelura is approved by insurance* - Start Opzelura 1.5% cream apply topically twice daily to affected areas of the face  - Moisturize several times a day with an ointment (plain petroleum jelly/Vaseline, Aquaphor, coconut oil) or heavy cream (Vanicream, CereVe, Cetaphil, Eucerin) - Keep baths short (5-57min), limit soap as much as possible (Cetaphil gentle cleanser, vanicream bar, Dove Tip to Toe unscented, Trade Joe oatmeal and honey bar soap), apply moisturizers and medication immediately after bath/shower  Atopic dermatitis, also called eczema, is a common and chronic skin condition in which the skin appears inflamed, red, itchy and dry. It most commonly affects children.  Atopic dermatitis is most likely caused by a combination of genetic and environmental factors. Genetic causes include differences in the proteins that form the skin barrier. When this barrier is broken down, the skin loses moisture more easily, becoming more dry, easily irritated, and hypersensitive. The skin is also more prone to infection (with bacteria, viruses, or fungi). The immune system in the skin may be different and overreact to environmental triggers such as pet dander and dust mites.  Allergies and asthma may be present more frequently in individuals with atopic dermatitis, but they are not the cause of eczema. Infrequently, when a specific food allergy is identified, eating that food may make atopic dermatitis worse, but it usually is not the cause of the eczema.  In infants, atopic dermatitis often starts as a dry red rash on the cheeks and around the  mouth, often made worse by drooling. As children grow older, the rash may be on the arms, legs, or in other areas where they are able to scratch. In teenagers, eczema is often on the inside of the elbows and knees, on the hands and feet, and around the eyes.  There is no cure, but there are recommendations to help manage this skin problem.  TREATMENT  Treatments are aimed at preventing dry skin, treating the rash, improving the itch, and minimizing exposure to triggers.  1. GENTLE SKIN CARE TO PREVENT DRYNESS Bathe daily or every-other-day in order to wash off dirt and other potential irritants (the optimal frequency of bathing is not yet clear). Water should be warm (not hot), and bath time should be limited to 5-10 minutes. Pat-dry the skin and immediately apply moisturizer while the skin is still slightly damp. The moisturizer provides a seal to hold the water in the skin. Finding a cream or ointment that the child likes or can tolerate is important, as resistance from the child may make the daily regimen difficult to keep up. The thicker the moisturizer, the better the barrier it generally provides. Ointments are more effective than creams, and creams more so than lotions. Creams are a reasonable option during the summer when thick greasy ointments are uncomfortable.   2. TREATING THE RASH The most commonly used medications are topical corticosteroids ("steroids"). There are many different types of topical corticosteroids that come in different strengths and formulations (for example, ointments, creams, lotions, solutions, gels, oils). Therefore, finding the right combination for the individual is important to treat and to minimize the risk of unwanted side effects from the corticosteroid, such as skin thinning. In general, these  topical corticosteroids should be applied as a thin layer and no more than twice daily. It is very unusual to see any side effects when a topical corticosteroid is  used as prescribed by your doctor. A relatively newer form of topical medication - in tacrolimus  ointment and pimecrolimus cream - is also helpful, particularly in thin-skinned areas such as the eyelids, armpits, and groin.* For severe and treatment-resistant cases of atopic dermatitis, systemic medications may be necessary. They may be associated with serious side effects and therefore require closer monitoring.  *The FDA placed a black-box warning on both tacrolimus  ointment and pimecrolimus cream in 2006 based on animal studies using the medications. Some animals developed skin cancer and lymphoma. Subsequently, the FDA released a statement that there is no causal relationship between the two medications and cancer. Because of this concern, there are ongoing studies to evaluate this relationship in humans. So far, studies support the safety of these medications. One showed that the rates of cancer in patients using these medications topically were less than the rates of the general population; several studies have shown that the medicines are undetectable in the blood, even in children using the medication over a large area of the body.  3. TREATING THE [facility] Tell your physician if your child is very itchy or if the itch is affecting the ability to sleep. Oral anti-itch medicines (antihistamines) can be helpful for inducing sleep, but usually do not reduce the itch and scratching.  4. AVOIDING TRIGGERS Some children have specific things that trigger episodes of itchiness and rashes, while others may have none that can be identified. Triggers may even change over time. Common triggers include: excessive bathing without moisturization, low humidity, cigarette or wood smoke exposure, emotional stress, sweat, friction and overheating of skin, and exposure to certain products such as wool, harsh soaps, fragrance, bubble baths, and laundry detergents. Many parents and physicians consider allergy testing to  identify possible triggers that could be avoided. There is limited utility for specific Immunoglobulin E (IgE) levels; if food allergy is being considered as a trigger for the dermatitis (which is unusual), specific IgE levels are, at best, a guideline of potential allergic triggers and require food challenge testing to further consider the possibility.   5. RECOGNIZING INFECTIONS AS A TRIGGER Because the skin barrier is compromised, individuals with atopic dermatitis can also develop infections on the skin from bacteria, viruses, or fungi. The most common infection is from Staphylococcus aureus bacteria, which should be suspected when the skin develops honey-colored crusts, or appears raw and weepy. Infected skin may result in a worsening of the atopic dermatitis and may not respond to standard therapy. Diluted bleach baths can be helpful to reduce infection by S. aureus and thereby help better control atopic dermatitis. Some patients require oral and/or topical antibiotics or antiviral medications for these types of flares. Patients with atopic dermatitis may also be at risk for the spread on the skin of herpes virus; therefore, family and friends with a known or suspected history of herpes virus (cold sores, fever blisters, etc.) should avoid contacting patients with atopic dermatitis when they are having an active outbreak.   Contributing SPD Members: Alan Edin, MD, Deatrice Susette Bathe, MD, Margaretann Hasten, MD, Lauraine Favorite, MD, Scarlett Ness, MD, Cydney Robins, MD  Committee Reviewers: Daphne Sickles, MD, Prentice Grate, MD  Expert Reviewer: Greig Galley, MD  The Society for Pediatric Dermatology and [employer] cannot be held responsible for any errors or for any consequences arising  from the use of the information contained in this handout. Handout originally published in Pediatric Dermatology: Vol. 33, No. 1 (2016).   2016 The Society for Pediatric Dermatology   Moisturizer:  Apply a moisturizer throughout the day and after bathing.  When you moisturize after bathing, this locks in the moisture.  This can lead to softer and smoother skin.  Body moisturizers come in ointments, creams, and lotions.  If you have dry skin, we recommend the use of ointments or creams rather than lotions.  In other words, something you scoop out of a jar rather than squirted out.  Ointments and creams are thicker and thus provide better moisturization.

## 2024-07-14 ENCOUNTER — Emergency Department (HOSPITAL_COMMUNITY)
Admission: EM | Admit: 2024-07-14 | Discharge: 2024-07-15 | Disposition: A | Discharge status: Home / Self Care | Attending: Emergency Medicine | Admitting: Emergency Medicine

## 2024-07-14 ENCOUNTER — Encounter (HOSPITAL_COMMUNITY): Payer: Self-pay | Admitting: Emergency Medicine

## 2024-07-14 ENCOUNTER — Other Ambulatory Visit: Payer: Self-pay

## 2024-07-14 ENCOUNTER — Emergency Department (HOSPITAL_COMMUNITY)

## 2024-07-14 DIAGNOSIS — T7840XA Allergy, unspecified, initial encounter: Secondary | ICD-10-CM | POA: Diagnosis present

## 2024-07-14 DIAGNOSIS — Z79899 Other long term (current) drug therapy: Secondary | ICD-10-CM | POA: Diagnosis not present

## 2024-07-14 DIAGNOSIS — T82898A Other specified complication of vascular prosthetic devices, implants and grafts, initial encounter: Secondary | ICD-10-CM

## 2024-07-14 DIAGNOSIS — T80818A Extravasation of other vesicant agent, initial encounter: Secondary | ICD-10-CM | POA: Diagnosis not present

## 2024-07-14 DIAGNOSIS — T782XXA Anaphylactic shock, unspecified, initial encounter: Secondary | ICD-10-CM | POA: Diagnosis not present

## 2024-07-14 DIAGNOSIS — J45909 Unspecified asthma, uncomplicated: Secondary | ICD-10-CM | POA: Insufficient documentation

## 2024-07-14 DIAGNOSIS — M7989 Other specified soft tissue disorders: Secondary | ICD-10-CM | POA: Diagnosis not present

## 2024-07-14 LAB — CBC WITH DIFFERENTIAL/PLATELET
Abs Immature Granulocytes: 0.02 K/uL (ref 0.00–0.07)
Basophils Absolute: 0 K/uL (ref 0.0–0.1)
Basophils Relative: 1 %
Eosinophils Absolute: 0.3 K/uL (ref 0.0–0.5)
Eosinophils Relative: 4 %
HCT: 33.7 % — ABNORMAL LOW (ref 36.0–46.0)
Hemoglobin: 10.7 g/dL — ABNORMAL LOW (ref 12.0–15.0)
Immature Granulocytes: 0 %
Lymphocytes Relative: 47 %
Lymphs Abs: 3.5 K/uL (ref 0.7–4.0)
MCH: 29.1 pg (ref 26.0–34.0)
MCHC: 31.8 g/dL (ref 30.0–36.0)
MCV: 91.6 fL (ref 80.0–100.0)
Monocytes Absolute: 0.5 K/uL (ref 0.1–1.0)
Monocytes Relative: 7 %
Neutro Abs: 3.1 K/uL (ref 1.7–7.7)
Neutrophils Relative %: 41 %
Platelets: 344 K/uL (ref 150–400)
RBC: 3.68 MIL/uL — ABNORMAL LOW (ref 3.87–5.11)
RDW: 15.4 % (ref 11.5–15.5)
WBC: 7.4 K/uL (ref 4.0–10.5)
nRBC: 0 % (ref 0.0–0.2)

## 2024-07-14 LAB — COMPREHENSIVE METABOLIC PANEL WITH GFR
ALT: 11 U/L (ref 0–44)
AST: 26 U/L (ref 15–41)
Albumin: 3.7 g/dL (ref 3.5–5.0)
Alkaline Phosphatase: 79 U/L (ref 38–126)
Anion gap: 13 (ref 5–15)
BUN: 12 mg/dL (ref 6–20)
CO2: 22 mmol/L (ref 22–32)
Calcium: 8.1 mg/dL — ABNORMAL LOW (ref 8.9–10.3)
Chloride: 101 mmol/L (ref 98–111)
Creatinine, Ser: 0.88 mg/dL (ref 0.44–1.00)
GFR, Estimated: >60 mL/min (ref >60)
Glucose, Bld: 184 mg/dL — ABNORMAL HIGH (ref 70–99)
Potassium: 3 mmol/L — ABNORMAL LOW (ref 3.5–5.1)
Sodium: 136 mmol/L (ref 135–145)
Total Bilirubin: <0.2 mg/dL (ref 0.0–1.2)
Total Protein: 6.3 g/dL — ABNORMAL LOW (ref 6.5–8.1)

## 2024-07-14 LAB — TROPONIN T, HIGH SENSITIVITY
Troponin T High Sensitivity: <15 ng/L (ref 0–19)
Troponin T High Sensitivity: <15 ng/L (ref 0–19)

## 2024-07-14 MED ORDER — FAMOTIDINE IN NACL 20-0.9 MG/50ML-% IV SOLN
20.0000 mg | Freq: Once | INTRAVENOUS | Status: AC
  Administered 2024-07-14: 20 mg via INTRAVENOUS
  Filled 2024-07-14: qty 50

## 2024-07-14 MED ORDER — ACETAMINOPHEN 500 MG PO TABS
1000.0000 mg | ORAL_TABLET | Freq: Once | ORAL | Status: AC
  Administered 2024-07-14: 1000 mg via ORAL
  Filled 2024-07-14: qty 2

## 2024-07-14 MED ORDER — PHENTOLAMINE MESYLATE 5 MG IJ SOLR
5.0000 mg | Freq: Once | INTRAMUSCULAR | Status: AC
  Administered 2024-07-14: 5 mg via SUBCUTANEOUS
  Filled 2024-07-14: qty 5

## 2024-07-14 MED ORDER — SODIUM CHLORIDE 0.9 % IV BOLUS
1000.0000 mL | Freq: Once | INTRAVENOUS | Status: AC
  Administered 2024-07-14: 1000 mL via INTRAVENOUS

## 2024-07-14 MED ORDER — NITROGLYCERIN 2 % TD OINT
1.0000 [in_us] | TOPICAL_OINTMENT | Freq: Three times a day (TID) | TRANSDERMAL | Status: DC
  Administered 2024-07-14: 1 [in_us] via TOPICAL
  Filled 2024-07-14 (×2): qty 1

## 2024-07-14 MED ORDER — CETIRIZINE HCL 5 MG/5ML PO SOLN
10.0000 mg | Freq: Once | ORAL | Status: AC
  Administered 2024-07-14: 10 mg via ORAL
  Filled 2024-07-14: qty 10

## 2024-07-14 MED ORDER — POTASSIUM CHLORIDE CRYS ER 20 MEQ PO TBCR
40.0000 meq | EXTENDED_RELEASE_TABLET | Freq: Once | ORAL | Status: AC
  Administered 2024-07-14: 40 meq via ORAL
  Filled 2024-07-14: qty 2

## 2024-07-14 NOTE — ED Provider Notes (Signed)
 [place] EMERGENCY DEPARTMENT AT [facility] Provider Note   CSN: [id number] Arrival date & time: 07/14/24  1641     Patient presents with: Allergic Reaction   Carla Buck is a 43 y.o. female.   HPI 43 year old female presents with an allergic reaction.  History is from EMS and patient.  Shortly before arrival, patient felt a sudden onset of lip swelling and lip itching.  Felt like there was a tightness in her throat and the back of her tongue was swelling.  Feels similar to prior anaphylaxis episodes.  The patient has had this multiple times before and she has multiple allergies so she is not sure what she was exposed to.  There was an initial note about a family member having a Snickers near her but she states she was not really near that person.  The patient took her own EpiPen  after taking the 100 mg of Benadryl  and her albuterol  inhaler, neither which helped.  EMS was called as this did not seem to help and the fire department also gave her an epinephrine  injection.  She feels like she never fully got better and started to get worse when in the EMS truck so EMS gave her IM epi and started her on an epi drip running at 2 mcg a minute.  She now feels like her symptoms are a lot better.  No further tongue or lip swelling or throat sensation.  She is having some sharp chest pain since the multiple epinephrines but states she has had this before with epinephrine  and/or asthma.  Feels a little short of breath but it is better.  No current rash.  She states in the past she has had to have multiple epinephrines different times and only once has had to be admitted but has had rebound symptoms before.  Prior to Admission medications   Medication Sig Start Date End Date Taking? Authorizing Provider  acyclovir  (ZOVIRAX ) 400 MG tablet TAKE 1 TABLET BY MOUTH TWICE A DAY 06/18/24   Vicci Barnie NOVAK, MD  albuterol  (VENTOLIN  HFA) 108 (90 Base) MCG/ACT inhaler Inhale 2 puffs every  4-6 hours as needed for cough, wheeze, tightness in chest, shortness of breath 11/01/23   Vicci Barnie NOVAK, MD  busPIRone (BUSPAR) 5 MG tablet Take 5 mg by mouth in the morning and at bedtime. 02/11/22   [provider]  clobetasol  ointment (TEMOVATE ) 0.05 % Apply 1 gram topically to affected area of skin twice daily. Stop once resolved and restart as needed for flares. Avoid use on face, armpits, groin unless otherwise indicated. 07/05/24   Raymund Lauraine BROCKS, MD  diphenhydrAMINE  (BENADRYL ) 25 mg capsule Take 25 mg by mouth every 6 (six) hours as needed.    [provider]  EPINEPHrine  (AUVI-Q ) 0.3 mg/0.3 mL IJ SOAJ injection Inject 0.3 mg into the muscle as needed for anaphylaxis. 12/29/20   Cheryl Reusing, FNP  EPINEPHrine  0.3 mg/0.3 mL IJ SOAJ injection Inject 0.3 mg into the muscle as needed for anaphylaxis. 03/12/24   Vicci Barnie NOVAK, MD  ergocalciferol  (VITAMIN D2) 1.25 MG (50000 UT) capsule Take 50,000 Units by mouth once a week.    [provider]  EUCRISA  2 % OINT APPLY TO AFFECTED AREA TWICE A DAY 08/22/23   Vicci Barnie NOVAK, MD  ferrous sulfate 325 (65 FE) MG tablet Take 325 mg by mouth daily with breakfast.    [provider]  hydrochlorothiazide  (HYDRODIURIL ) 25 MG tablet TAKE 1 TABLET (25 MG TOTAL) BY  MOUTH DAILY. 03/07/24   Vicci Barnie NOVAK, MD  ipratropium-albuterol  (DUONEB) 0.5-2.5 (3) MG/3ML SOLN Take 3 mLs by nebulization every 6 (six) hours as needed.    [provider]  levocetirizine (XYZAL ) 5 MG tablet TAKE 1 TABLET BY MOUTH TWICE A DAY 03/15/22   Luke Orlan HERO, DO  montelukast  (SINGULAIR ) 10 MG tablet Take 1 tablet (10 mg total) by mouth at bedtime. 03/14/24   Vicci Barnie NOVAK, MD  Multiple Vitamins-Minerals (BARIATRIC MULTIVITAMINS/IRON PO) Take 1 tablet by mouth.    [provider]  Olopatadine  HCl 0.2 % SOLN 1 drop each eye daily PRN 11/09/23   Vicci Barnie NOVAK, MD  ondansetron  (ZOFRAN -ODT) 4 MG disintegrating tablet TAKE  1-2 TABLETS (4-8 MG TOTAL) BY MOUTH EVERY 8 (EIGHT) HOURS AS NEEDED FOR NAUSEA. APPOINTMENT NEEDED FOR FURTHER REFILLS. *INS LIMIT 07/28/21   Ines Onetha NOVAK, MD  phentermine  37.5 MG capsule Take 1 capsule (37.5 mg total) by mouth daily. 05/22/24   Vicci Barnie NOVAK, MD  Plecanatide  (TRULANCE ) 3 MG TABS Take 1 tablet (3 mg total) by mouth daily. 11/30/22   Vivienne Delon HERO, PA-C  propranolol (INDERAL) 10 MG tablet TAKE 1 TABLET BY MOUTH TWICE A DAY AS NEEDED 07/20/23   Vicci Barnie NOVAK, MD  rizatriptan  (MAXALT -MLT) 10 MG disintegrating tablet TAKE 1 TABLET BY MOUTH AS NEEDED FOR MIGRAINE. MAY REPEAT IN 2 HOURS IF NEEDEDSTRENGTH: 10 MG 07/01/24   Vicci Barnie NOVAK, MD  Ruxolitinib Phosphate  (OPZELURA ) 1.5 % CREA Apply topically twice daily to affected areas. 07/05/24   Raymund Lauraine BROCKS, MD  sertraline (ZOLOFT) 50 MG tablet Take by mouth. 04/29/23   [provider]  tacrolimus  (PROTOPIC ) 0.1 % ointment Apply 2 grams twice daily to affected areas of skin 07/05/24   Raymund Lauraine BROCKS, MD  topiramate  (TOPAMAX ) 50 MG tablet Take 1 tablet (50 mg total) by mouth at bedtime. 05/22/24   Vicci Barnie NOVAK, MD  triamcinolone  ointment (KENALOG ) 0.1 % Apply 1 application topically 2 (two) times daily. 07/30/19   Bobbitt, Elgin Pepper, MD  triamcinolone  ointment (KENALOG ) 0.1 % Apply 7 grams twice daily to affected areas of skin. Stop once resolved and restart as needed for flares. Avoid use on face, armpits, groin unless otherwise indicated. 07/05/24   Raymund Lauraine BROCKS, MD  valsartan  (DIOVAN ) 40 MG tablet TAKE 1 TABLET BY MOUTH EVERY DAY 12/09/23   Vicci Barnie NOVAK, MD  verapamil  (CALAN ) 80 MG tablet TAKE 1 TABLET BY MOUTH 3 (THREE) TIMES DAILY. 06/21/24   Vicci Barnie NOVAK, MD    Allergies: Bee venom, Peanut-containing drug products, Pollen extract, Shellfish allergy, Wasp venom, Xolair [omalizumab], Amoxicillin, Bug itch releaf [misc natural products], Contrave [naltrexone-bupropion hcl er],  Corn-containing products, Dust mite extract, Gluten meal, Iodine, Lidocaine, Orange fruit [citrus], Penicillins, Sesame oil, Soy allergy (obsolete), Tetracaine, and Tomato    Review of Systems  HENT:  Positive for facial swelling.   Respiratory:  Positive for shortness of breath.   Cardiovascular:  Positive for chest pain.  Skin:  Positive for rash.    Updated Vital Signs BP 132/69   Pulse 76   Temp 98.6 F (37 C)   Resp 14   Ht 5' 5 (1.651 m)   Wt 106.6 kg   SpO2 100%   BMI 39.11 kg/m   Physical Exam Vitals and nursing note reviewed.  Constitutional:      General: She is not in acute distress.    Appearance: She is well-developed. She is not ill-appearing or  diaphoretic.  HENT:     Head: Normocephalic and atraumatic.     Mouth/Throat:     Comments: Unremarkable lips in appearance. No tongue/oropharyngeal swelling. No stridor or abnormal voice. Cardiovascular:     Rate and Rhythm: Regular rhythm. Tachycardia present.     Heart sounds: Normal heart sounds.  Pulmonary:     Effort: Pulmonary effort is normal.     Breath sounds: Normal breath sounds. No stridor. No wheezing.  Abdominal:     Palpations: Abdomen is soft.     Tenderness: There is no abdominal tenderness.  Skin:    General: Skin is warm and dry.     Findings: No rash.  Neurological:     Mental Status: She is alert.     (all labs ordered are listed, but only abnormal results are displayed) Labs Reviewed  CBC WITH DIFFERENTIAL/PLATELET - Abnormal; Notable for the following components:      Result Value   RBC 3.68 (*)    Hemoglobin 10.7 (*)    HCT 33.7 (*)    All other components within normal limits  COMPREHENSIVE METABOLIC PANEL WITH GFR - Abnormal; Notable for the following components:   Potassium 3.0 (*)    Glucose, Bld 184 (*)    Calcium 8.1 (*)    Total Protein 6.3 (*)    All other components within normal limits  TROPONIN T, HIGH SENSITIVITY  TROPONIN T, HIGH SENSITIVITY    EKG: EKG  Interpretation Date/Time:  Saturday July 14 2024 19:23:34 EST Ventricular Rate:  87 PR Interval:  150 QRS Duration:  91 QT Interval:  405 QTC Calculation: 488 R Axis:   22  Text Interpretation: Sinus rhythm Low voltage, precordial leads Borderline T abnormalities, anterior leads Borderline prolonged QT interval nonspecific ST changes in similar distribution to earlier in the day but mildly improved Confirmed by Freddi Hamilton [phone]) on 07/14/2024 7:26:54 PM  Radiology: [facility] Chest Portable 1 View Result Date: 07/14/2024 CLINICAL DATA:  Allergic reaction. Short of breath. Mouth swelling. EXAM: PORTABLE CHEST 1 VIEW COMPARISON:  12/02/2022 and older exams. FINDINGS: Cardiac silhouette is normal in size and configuration. Normal mediastinal and hilar contours. Clear lungs.  No pleural effusion or pneumothorax. Skeletal structures are grossly intact. IMPRESSION: No active disease. Electronically Signed   By: Alm Parkins M.D.   On: 07/14/2024 17:56     .Critical Care  Performed by: Freddi Hamilton, MD Authorized by: Freddi Hamilton, MD   Critical care provider statement:    Critical care time (minutes):  30   Critical care time was exclusive of:  Separately billable procedures and treating other patients   Critical care was necessary to treat or prevent imminent or life-threatening deterioration of the following conditions:  Circulatory failure   Critical care was time spent personally by me on the following activities:  Development of treatment plan with patient or surrogate, discussions with consultants, evaluation of patient's response to treatment, examination of patient, ordering and review of laboratory studies, ordering and review of radiographic studies, ordering and performing treatments and interventions, pulse oximetry, re-evaluation of patient's condition and review of old charts    Medications Ordered in the ED  phentolamine (REGITINE) injection 5 mg (has no administration  in time range)  nitroGLYCERIN (NITROGLYN) 2 % ointment 1 inch (has no administration in time range)  sodium chloride  0.9 % bolus 1,000 mL (0 mLs Intravenous Stopped 07/14/24 1837)  famotidine  (PEPCID ) IVPB 20 mg premix (0 mg Intravenous Stopped 07/14/24 1756)  cetirizine HCl (Zyrtec)  5 MG/5ML solution 10 mg (10 mg Oral Given 07/14/24 1758)  potassium chloride  SA (KLOR-CON  M) CR tablet 40 mEq (40 mEq Oral Given 07/14/24 2026)                                    Medical Decision Making Amount and/or Complexity of Data Reviewed Independent Historian: EMS Labs: ordered.    Details: Normal troponin x 2 Radiology: ordered and independent interpretation performed.    Details: No CHF ECG/medicine tests: ordered and independent interpretation performed.    Details: Diffuse ST depressions, improving on repeat ECG  Risk Prescription drug management. Decision regarding hospitalization.   Patient has remained without symptoms of anaphylaxis since arrival.  I did stop her peripheral epinephrine  drip a few minutes after talking to her.  She has done well besides some itching and was given some cetirizine and Pepcid .  That has gotten better.  Heart rate and blood pressure are improving.  I discussed with cardiology, Dr. Otelia.  Her initial ECG does show some nonspecific ST depressions diffusely.  However her troponins have been negative, he states this is probably more secondary to the anaphylaxis and epinephrine  dosing and if troponins are okay and chest pain has resolved (which it has) then her improving ECG would warrant no further workup.  As I was about to discharge her on my final reassessment, she notes that she still has no further anaphylaxis symptoms but is now complaining of swelling in her right upper arm where she had a peripheral IV in her antecubital fossa.  She has not had any infusions going for the last hour or more as her fluid bolus and Pepcid  were stopped a while ago.  However she  just let the nurse know of this and upon talking to her she states she has had burning whenever the meds and fluids were being given by EMS.  Did not appear like there was any swelling until recently.  She has intact cap refill, normal sensation, and a strong radial pulse on my exam.  There is no obvious significant discoloration to her right upper arm but she does have some mild tenderness and swelling to the distal upper arm above the antecubital fossa.  I had a talk with patient, given that she was having the symptoms of burning and pain with the IV site while she was getting epinephrine  peripherally, we should treat this as if this was an epinephrine  extravasation and I have ordered phentolamine and subcutaneous nitroglycerin as per protocol.  Will elevate her arm and do compresses.  She will need to be admitted to be watched and have serial exams.  Discussed with Dr. Tobie for observation and serial exams.     Final diagnoses:  Anaphylaxis, initial encounter    ED Discharge Orders     None          Freddi Hamilton, MD 07/14/24 2311

## 2024-07-14 NOTE — ED Notes (Signed)
 Lab called to notify that light green is hemolyzed and needs a redraw

## 2024-07-14 NOTE — ED Triage Notes (Signed)
 Pt via EMS from home c/o allergic reaction., Pt is allergic to peanuts and was near her daughter who was eating a Snickers bar. Pt did not come directly into contact with peanuts but began feeling SOB with swelling to mouth and lips. Received 3 doses of IM epi PTA. 18ga in right AC with epinephrine  drip running at 2mcg/min at time of arrival. Pt also received 125mg  solu medrol  and 7.5mg  albuterol . Pt took 100mg  benadryl  PO PTA. Pt feels better then earlier but notes her chest still feels tight.   HR 104 BP 150/60 O2 97% on neb

## 2024-07-14 NOTE — ED Notes (Signed)
 Called lab to f/u on CMP and troponin results; per lab tech, they are working on it and results should post in 5-7 minutes.

## 2024-07-15 ENCOUNTER — Other Ambulatory Visit: Payer: Self-pay | Admitting: Internal Medicine

## 2024-07-15 ENCOUNTER — Encounter: Payer: Self-pay | Admitting: Internal Medicine

## 2024-07-15 DIAGNOSIS — T80818A Extravasation of other vesicant agent, initial encounter: Secondary | ICD-10-CM

## 2024-07-15 MED ORDER — OXYCODONE HCL 5 MG PO TABS: 2.5000 mg | ORAL_TABLET | Freq: Four times a day (QID) | ORAL | 0 refills | Status: AC | PRN

## 2024-07-15 MED ORDER — SODIUM CHLORIDE 0.9 % IV SOLN: Freq: Once | INTRAVENOUS | Status: AC

## 2024-07-15 MED ORDER — OXYCODONE HCL 5 MG PO TABS
5.0000 mg | ORAL_TABLET | Freq: Once | ORAL | Status: AC
  Administered 2024-07-15: 5 mg via ORAL
  Filled 2024-07-15: qty 1

## 2024-07-15 MED ORDER — SODIUM CHLORIDE 0.9 % IV SOLN: Freq: Once | INTRAVENOUS | Status: DC

## 2024-07-15 MED ORDER — FAMOTIDINE 20 MG PO TABS: 20.0000 mg | ORAL_TABLET | Freq: Every day | ORAL | Status: DC

## 2024-07-15 MED ORDER — HYDROMORPHONE HCL 1 MG/ML IJ SOLN
1.0000 mg | INTRAMUSCULAR | Status: DC | PRN
  Administered 2024-07-15: 1 mg via INTRAVENOUS
  Filled 2024-07-15: qty 1

## 2024-07-15 MED ORDER — HYDROMORPHONE HCL 1 MG/ML IJ SOLN
0.5000 mg | INTRAMUSCULAR | Status: DC | PRN
  Administered 2024-07-15: 0.5 mg via INTRAVENOUS
  Filled 2024-07-15: qty 1

## 2024-07-15 MED ORDER — PREDNISONE 20 MG PO TABS: 40.0000 mg | ORAL_TABLET | Freq: Every day | ORAL | 0 refills | Status: DC

## 2024-07-15 NOTE — ED Notes (Signed)
 Patient given pillow to further more elevate her arm.

## 2024-07-15 NOTE — ED Provider Notes (Signed)
 I assumed care of this patient from previous provider.  Please see their note for further details of history, exam, and MDM.   Briefly patient is a 43 y.o. female who presented anaphylaxis and received epi and route.  Epi drip from EMS extravasated in the right upper extremity and patient is receiving treatment.  Currently awaiting evaluation by internal medicine for admission for monitoring.  Incident occurred approximately 7 hours ago.  Dr. Tobie from the hospitalist service evaluated the patient and believes she can continue monitoring here in the emergency department.  If symptoms worsen she can be admitted and vascular surgery contacted for surgical intervention.    I continue to reevaluate the patient and provide her with IV fluids and pain medicine. Reevaluation images uploaded in the media time with timestamps (1st and last of 5 images shown below) which revealed no signs concerning for tissue necrosis.  Pain control achieved.  Feel patient is stable for discharge with continued supportive management.   Document Information  Photos  Arm  07/15/2024 00:58    Document Information  Photos  Arm  07/15/2024 05:45    The patient appears reasonably screened and/or stabilized for discharge and I doubt any other medical condition or other [facility] requiring further screening, evaluation, or treatment in the ED at this time. I have discussed the findings, Dx and Tx plan with the patient/family who expressed understanding and agree(s) with the plan. Discharge instructions discussed at length. The patient/family was given strict return precautions who verbalized understanding of the instructions. No further questions at time of discharge.  Disposition: Discharge  Condition: Good  ED Discharge Orders          Ordered    oxyCODONE  (ROXICODONE ) 5 MG immediate release tablet  Every 6 hours PRN        07/15/24 0607    predniSONE  (DELTASONE ) 20 MG tablet  Daily with breakfast        07/15/24  0609    famotidine  (PEPCID ) 20 MG tablet  Daily        07/15/24 9390            [place]  narcotic database reviewed and no active prescriptions noted.   Follow Up: Vicci Barnie NOVAK, MD [address] [place] [address] [place] [address] [phone]  Call  to schedule an appointment for close follow up  Pearline Norman RAMAN, MD [address] [place] [address] [phone]  Call  as needed      Trine Raynell Moder, MD 07/15/24 0730

## 2024-07-15 NOTE — Discharge Instructions (Addendum)
 You were treated for leakage of epinephrine  from your IV into the tissue of your arm. You received medication (phentolamine ) to help prevent tissue damage, and you have been observed for several hours with improvement.  Please follow these instructions after leaving the hospital:  Monitor your arm closely: Check the area for any new or worsening pain, swelling, redness, blistering, skin color changes (such as pale, blue, or black areas), or loss of feeling or movement. Mild soreness or bruising may persist for a few days.  Keep the arm elevated as much as possible for the next 24 hours to reduce swelling.  Apply warm compresses to the area for 15-20 minutes, 3-4 times a day, if comfortable. This can help with circulation and pain.  Take pain medication as directed if needed. Avoid aspirin or NSAIDs unless specifically recommended. For pain control you may take 1000 mg of Tylenol  every 8 hours scheduled.  In addition you can take 0.5 to 1 tablet of Oxycodone  every 6 hours as needed for pain not controlled with the scheduled Tylenol .  Do not use the affected arm for heavy lifting or strenuous activity until pain and swelling have resolved.  Watch for warning signs: Return to the emergency department or contact your doctor immediately if you notice:  Increasing pain, swelling, or redness  Blistering, skin breakdown, or black discoloration  Numbness, tingling, or weakness in the arm or hand  Loss of pulse or coldness in the fingers  Fever or signs of infection  Most patients recover fully with prompt treatment and observation. If you have any questions or concerns, or if symptoms worsen, please seek medical attention in the ER right away.  You may resume normal activities as tolerated once pain and swelling have resolved and there are no concerning changes to the skin or function of your arm

## 2024-07-15 NOTE — ED Notes (Signed)
 Put heat packs on patient's arm, with washcloth in between.

## 2024-07-15 NOTE — Consult Note (Signed)
 Initial Consultation Note   Patient: Carla Buck FMW:[id number] DOB: 1981/05/18 PCP: Vicci Barnie NOVAK, MD DOA: 07/14/2024 DOS: the patient was seen and examined on 07/15/2024 Primary service: No att. providers found  Referring physician: Dr. Freddi Reason for consult: Suspected epinephrine  extravasation  Assessment and Plan: Suspected epinephrine  extravasation from right antecubital fossa peripheral IV site: Patient arrived to ED at 1641 with IV epinephrine  at 2 mcg/min via right AC PIV.  Unclear when IV epinephrine  was discontinued.  She reported pain/burning at the time EMS were administering meds.  Then reported swelling to right [facility] area just prior to when she was going to be discharged (sometime between 2200-2300).  Currently patient is having exquisite pain to the right AC site, worse with light touch.  There is no obvious overlying erythema although skin tone limits assessment.  At time of my evaluation she has good radial pulse, intact cap refill, and normal sensation.  She has been given phentolamine.  Agree with elevation, warm compresses, pain control.  Currently no recommendation for inpatient admission from medical perspective.  Can be observed in ED for few more hours, but if she worsens or is not improving then would recommend vascular surgery consult.  Anaphylactic reaction, unspecified trigger: Resolved after receiving IM epi x 3, IV epinephrine  via peripheral IV, and IV Pepcid .  TRH will sign off at present, please call us  again when needed.  HPI: VERBENA Buck is a 43 y.o. female with medical history significant for asthma, HTN, multiple allergies with history of anaphylaxis reaction who presented to the ED for an anaphylactic reaction without clear trigger.  Patient states that earlier today she began to feel short of breath with swelling to her mouth and lips and feeling of her throat closing.  She developed hives.  She has no idea what might have  triggered this event, she reports that she has multiple severe allergies.  She took oral Benadryl  100 mg and used her home IM epinephrine  pen.  She called 911.  She was given additional IM epi with fire and then another IM epi with EMS.  A peripheral IV was placed to her right antecubital fossa and she was started on IV epinephrine  at 2 mcg/min.  This was running on arrival to the ED at 1641, unclear when it was discontinued.  Patient was given IV Pepcid  20 mg and 1 L normal saline, also presumably through the right AC PIV.  Patient's anaphylactic reaction completely resolved.  She was going to be discharged from the ED, however when the ED provider went to discuss this patient reported that she is now having swelling in the right upper arm where she had her peripheral IV with pain and burning sensation.  She reports that she did first noticed this burning and pain sensation at the time she was receiving medications and fluids with EMS.   Review of Systems: As mentioned in the history of present illness. All other systems reviewed and are negative. Past Medical History:  Diagnosis Date   Allergy    Anxiety    Asthma    Depression    Eczema    Hearing impairment    Wears hearing aids   Hypertension    Migraine    Urticaria    Past Surgical History:  Procedure Laterality Date   ABDOMINAL HYSTERECTOMY  11/2015   fibroids   BREAST LUMPECTOMY     BREAST LUMPECTOMY Right 11/14/2019   GASTRIC BYPASS     OOPHORECTOMY Left 09/2018  torsion   Social History:  reports that she has never smoked. She has never used smokeless tobacco. She reports current alcohol use. She reports that she does not use drugs.  Allergies  Allergen Reactions   Bee Venom Anaphylaxis   Peanut-Containing Drug Products Anaphylaxis   Pollen Extract Shortness Of Breath    Tree and grass    Shellfish Allergy Anaphylaxis   Wasp Venom Anaphylaxis   Xolair [Omalizumab] Anaphylaxis   Amoxicillin Hives   Bug Itch  Releaf [Misc Natural Products] Hives    Roaches, ants and dustmites   Contrave [Naltrexone-Bupropion Hcl Er] Hives   Corn-Containing Products     GI unset   Dust Mite Extract     Asthma trigger   Gluten Meal     GI upset, HIVeS   Iodine Hives   Lidocaine Hives   Orange Fruit [Citrus] Hives   Penicillins Hives   Sesame Oil Diarrhea    GI upset   Soy Allergy (Obsolete) Hives    GI upset   Tetracaine Hives   Tomato Hives    Family History  Problem Relation Age of Onset   Hypertension Mother    Allergic rhinitis Mother    Asthma Mother    Eczema Mother    Depression Father    Asthma Father    Bipolar disorder Sister    Asthma Sister    Hypertension Maternal Grandmother    Hypertension Paternal Grandmother    Diabetes Paternal Grandmother    Hypertension Paternal Grandfather    Migraines Neg Hx     Prior to Admission medications   Medication Sig Start Date End Date Taking? Authorizing Provider  acyclovir  (ZOVIRAX ) 400 MG tablet TAKE 1 TABLET BY MOUTH TWICE A DAY 06/18/24   Vicci Barnie NOVAK, MD  albuterol  (VENTOLIN  HFA) 108 [phone] Base) MCG/ACT inhaler Inhale 2 puffs every 4-6 hours as needed for cough, wheeze, tightness in chest, shortness of breath 11/01/23   Vicci Barnie NOVAK, MD  busPIRone (BUSPAR) 5 MG tablet Take 5 mg by mouth in the morning and at bedtime. 02/11/22   [provider]  clobetasol  ointment (TEMOVATE ) 0.05 % Apply 1 gram topically to affected area of skin twice daily. Stop once resolved and restart as needed for flares. Avoid use on face, armpits, groin unless otherwise indicated. 07/05/24   Raymund Lauraine BROCKS, MD  diphenhydrAMINE  (BENADRYL ) 25 mg capsule Take 25 mg by mouth every 6 (six) hours as needed.    [provider]  EPINEPHrine  (AUVI-Q ) 0.3 mg/0.3 mL IJ SOAJ injection Inject 0.3 mg into the muscle as needed for anaphylaxis. 12/29/20   Cheryl Reusing, FNP  EPINEPHrine  0.3 mg/0.3 mL IJ SOAJ injection Inject 0.3 mg into the muscle as needed  for anaphylaxis. 03/12/24   Vicci Barnie NOVAK, MD  ergocalciferol  (VITAMIN D2) 1.25 MG (50000 UT) capsule Take 50,000 Units by mouth once a week.    [provider]  EUCRISA  2 % OINT APPLY TO AFFECTED AREA TWICE A DAY 08/22/23   Vicci Barnie NOVAK, MD  ferrous sulfate 325 (65 FE) MG tablet Take 325 mg by mouth daily with breakfast.    [provider]  hydrochlorothiazide  (HYDRODIURIL ) 25 MG tablet TAKE 1 TABLET (25 MG TOTAL) BY MOUTH DAILY. 03/07/24   Vicci Barnie NOVAK, MD  ipratropium-albuterol  (DUONEB) 0.5-2.5 (3) MG/3ML SOLN Take 3 mLs by nebulization every 6 (six) hours as needed.    [provider]  levocetirizine (XYZAL ) 5 MG tablet TAKE 1 TABLET BY MOUTH TWICE A  DAY 03/15/22   Luke Orlan HERO, DO  montelukast  (SINGULAIR ) 10 MG tablet Take 1 tablet (10 mg total) by mouth at bedtime. 03/14/24   Vicci Barnie NOVAK, MD  Multiple Vitamins-Minerals (BARIATRIC MULTIVITAMINS/IRON PO) Take 1 tablet by mouth.    [provider]  nitrofurantoin (MACRODANTIN) 50 MG capsule Take 50 mg by mouth daily as needed.    [provider]  Olopatadine  HCl 0.2 % SOLN 1 drop each eye daily PRN 11/09/23   Vicci Barnie NOVAK, MD  ondansetron  (ZOFRAN -ODT) 4 MG disintegrating tablet TAKE 1-2 TABLETS (4-8 MG TOTAL) BY MOUTH EVERY 8 (EIGHT) HOURS AS NEEDED FOR NAUSEA. APPOINTMENT NEEDED FOR FURTHER REFILLS. *INS LIMIT 07/28/21   Ines Onetha NOVAK, MD  phentermine  37.5 MG capsule Take 1 capsule (37.5 mg total) by mouth daily. 05/22/24   Vicci Barnie NOVAK, MD  Plecanatide  (TRULANCE ) 3 MG TABS Take 1 tablet (3 mg total) by mouth daily. 11/30/22   Vivienne Delon HERO, PA-C  propranolol (INDERAL) 10 MG tablet TAKE 1 TABLET BY MOUTH TWICE A DAY AS NEEDED 07/20/23   Vicci Barnie NOVAK, MD  rizatriptan  (MAXALT -MLT) 10 MG disintegrating tablet TAKE 1 TABLET BY MOUTH AS NEEDED FOR MIGRAINE. MAY REPEAT IN 2 HOURS IF NEEDEDSTRENGTH: 10 MG 07/01/24   Vicci Barnie NOVAK, MD  Ruxolitinib Phosphate   (OPZELURA ) 1.5 % CREA Apply topically twice daily to affected areas. 07/05/24   Raymund Lauraine BROCKS, MD  sertraline (ZOLOFT) 50 MG tablet Take by mouth. 04/29/23   [provider]  tacrolimus  (PROTOPIC ) 0.1 % ointment Apply 2 grams twice daily to affected areas of skin 07/05/24   Raymund Lauraine BROCKS, MD  topiramate  (TOPAMAX ) 50 MG tablet Take 1 tablet (50 mg total) by mouth at bedtime. 05/22/24   Vicci Barnie NOVAK, MD  triamcinolone  ointment (KENALOG ) 0.1 % Apply 1 application topically 2 (two) times daily. 07/30/19   Bobbitt, Elgin Pepper, MD  triamcinolone  ointment (KENALOG ) 0.1 % Apply 7 grams twice daily to affected areas of skin. Stop once resolved and restart as needed for flares. Avoid use on face, armpits, groin unless otherwise indicated. 07/05/24   Raymund Lauraine BROCKS, MD  valsartan  (DIOVAN ) 40 MG tablet TAKE 1 TABLET BY MOUTH EVERY DAY 12/09/23   Vicci Barnie NOVAK, MD  verapamil  (CALAN ) 80 MG tablet TAKE 1 TABLET BY MOUTH 3 (THREE) TIMES DAILY. 06/21/24   Vicci Barnie NOVAK, MD    Physical Exam: Vitals:   07/14/24 1830 07/14/24 2055 07/14/24 2145 07/14/24 2330  BP: 125/73  132/69 (!) 147/83  Pulse: 89  76 76  Resp: 20  14 18   Temp:  98.6 F (37 C)    TempSrc:      SpO2: 98%  100% 99%  Weight:      Height:       General: Resting in bed with head elevated, anxious, uncomfortable HEENT: EOMI, no scleral icterus Cardiac: RRR Pulm:  moving normal volumes of air Ext: Slight area of swelling at right antecubital fossa site, exquisite tenderness to light palpation.  No obvious erythema although skin tone limits evaluation.  Capillary refill intact, radial pulse intact, sensation intact. Neuro: alert and oriented X3  Data Reviewed:   There are no new results to review at this time.    Family Communication: Husband at bedside Primary team communication: Dr. Freddi, Dr. Trine Thank you very much for involving us  in the care of your patient.  Author: Jorie Blanch, MD 07/15/2024  12:26 AM  For on call review [email or web address].

## 2024-07-16 ENCOUNTER — Other Ambulatory Visit: Payer: Self-pay

## 2024-07-16 ENCOUNTER — Emergency Department (HOSPITAL_COMMUNITY)
Admit: 2024-07-16 | Discharge: 2024-07-16 | Disposition: A | Attending: Emergency Medicine | Admitting: Emergency Medicine

## 2024-07-16 ENCOUNTER — Other Ambulatory Visit: Payer: Self-pay | Admitting: Internal Medicine

## 2024-07-16 ENCOUNTER — Emergency Department (HOSPITAL_COMMUNITY)
Admission: EM | Admit: 2024-07-16 | Discharge: 2024-07-16 | Disposition: A | Discharge status: Home / Self Care | Attending: Emergency Medicine | Admitting: Emergency Medicine

## 2024-07-16 ENCOUNTER — Ambulatory Visit: Attending: Internal Medicine | Admitting: Internal Medicine

## 2024-07-16 VITALS — BP 138/85 | HR 66 | Temp 98.2°F

## 2024-07-16 DIAGNOSIS — G629 Polyneuropathy, unspecified: Secondary | ICD-10-CM | POA: Diagnosis not present

## 2024-07-16 DIAGNOSIS — T782XXA Anaphylactic shock, unspecified, initial encounter: Secondary | ICD-10-CM

## 2024-07-16 DIAGNOSIS — M7989 Other specified soft tissue disorders: Secondary | ICD-10-CM | POA: Diagnosis not present

## 2024-07-16 DIAGNOSIS — T82898A Other specified complication of vascular prosthetic devices, implants and grafts, initial encounter: Secondary | ICD-10-CM

## 2024-07-16 DIAGNOSIS — I82890 Acute embolism and thrombosis of other specified veins: Secondary | ICD-10-CM | POA: Diagnosis not present

## 2024-07-16 DIAGNOSIS — M79641 Pain in right hand: Secondary | ICD-10-CM | POA: Diagnosis present

## 2024-07-16 DIAGNOSIS — I10 Essential (primary) hypertension: Secondary | ICD-10-CM | POA: Diagnosis not present

## 2024-07-16 DIAGNOSIS — T782XXD Anaphylactic shock, unspecified, subsequent encounter: Secondary | ICD-10-CM

## 2024-07-16 DIAGNOSIS — M79601 Pain in right arm: Secondary | ICD-10-CM

## 2024-07-16 DIAGNOSIS — T82898D Other specified complication of vascular prosthetic devices, implants and grafts, subsequent encounter: Secondary | ICD-10-CM

## 2024-07-16 LAB — BASIC METABOLIC PANEL WITH GFR
Anion gap: 9 (ref 5–15)
BUN: 13 mg/dL (ref 6–20)
CO2: 29 mmol/L (ref 22–32)
Calcium: 9.5 mg/dL (ref 8.9–10.3)
Chloride: 97 mmol/L — ABNORMAL LOW (ref 98–111)
Creatinine, Ser: 0.77 mg/dL (ref 0.44–1.00)
GFR, Estimated: >60 mL/min (ref >60)
Glucose, Bld: 119 mg/dL — ABNORMAL HIGH (ref 70–99)
Potassium: 3.7 mmol/L (ref 3.5–5.1)
Sodium: 136 mmol/L (ref 135–145)

## 2024-07-16 LAB — CBC
HCT: 36.9 % (ref 36.0–46.0)
Hemoglobin: 11.8 g/dL — ABNORMAL LOW (ref 12.0–15.0)
MCH: 29.4 pg (ref 26.0–34.0)
MCHC: 32 g/dL (ref 30.0–36.0)
MCV: 91.8 fL (ref 80.0–100.0)
Platelets: 353 K/uL (ref 150–400)
RBC: 4.02 MIL/uL (ref 3.87–5.11)
RDW: 15.5 % (ref 11.5–15.5)
WBC: 7 K/uL (ref 4.0–10.5)
nRBC: 0 % (ref 0.0–0.2)

## 2024-07-16 LAB — CK: Total CK: 120 U/L (ref 38–234)

## 2024-07-16 NOTE — ED Provider Notes (Signed)
 Cataract EMERGENCY DEPARTMENT AT [facility] Provider Note   CSN: [id number] Arrival date & time: 07/16/24  1459     Patient presents with: Arm Injury   Carla Buck is a 43 y.o. female.    Arm Injury    Patient has a history of asthma allergy and migraines depression urticaria hypertension.  Patient states she was seen in the emergency room on November 29 for an anaphylactic reaction.  Patient was started on epi drip by EMS.  Patient was noted to have extravasation of her epinephrine  infusion into her right antecubital fossa.  Patient was ultimately discharged from the emergency room.  Patient was seen by her primary care doctor today.  They could not palpate a brachial pulse and she continued to have pain in that area she was sent to the ED for further evaluation.  Patient states she continues to have some intermittent numbness in that area.  She continues to have pain with movement in that area.    Prior to Admission medications   Medication Sig Start Date End Date Taking? Authorizing Provider  acyclovir  (ZOVIRAX ) 400 MG tablet TAKE 1 TABLET BY MOUTH TWICE A DAY 06/18/24   Vicci Barnie NOVAK, MD  albuterol  (VENTOLIN  HFA) [phone] Base) MCG/ACT inhaler Inhale 2 puffs every 4-6 hours as needed for cough, wheeze, tightness in chest, shortness of breath 11/01/23   Vicci Barnie NOVAK, MD  busPIRone (BUSPAR) 5 MG tablet Take 5 mg by mouth in the morning and at bedtime. 02/11/22   [provider]  clobetasol  ointment (TEMOVATE ) 0.05 % Apply 1 gram topically to affected area of skin twice daily. Stop once resolved and restart as needed for flares. Avoid use on face, armpits, groin unless otherwise indicated. Patient not taking: Reported on 07/16/2024 07/05/24   Raymund Lauraine BROCKS, MD  diphenhydrAMINE  (BENADRYL ) 25 mg capsule Take 25 mg by mouth every 6 (six) hours as needed.    [provider]  EPINEPHrine  0.3 mg/0.3 mL IJ SOAJ injection INJECT 0.3 MG INTO THE  MUSCLE AS NEEDED FOR ANAPHYLAXIS. 07/15/24   Vicci Barnie NOVAK, MD  ergocalciferol  (VITAMIN D2) 1.25 MG (50000 UT) capsule Take 50,000 Units by mouth once a week.    [provider]  EUCRISA  2 % OINT APPLY TO AFFECTED AREA TWICE A DAY 08/22/23   Vicci Barnie NOVAK, MD  famotidine  (PEPCID ) 20 MG tablet Take 1 tablet (20 mg total) by mouth daily for 5 days. Patient not taking: Reported on 07/16/2024 07/15/24 07/20/24  Trine Raynell Moder, MD  ferrous sulfate 325 (65 FE) MG tablet Take 325 mg by mouth daily with breakfast. Patient not taking: Reported on 07/16/2024    [provider]  hydrochlorothiazide  (HYDRODIURIL ) 25 MG tablet TAKE 1 TABLET (25 MG TOTAL) BY MOUTH DAILY. 03/07/24   Vicci Barnie NOVAK, MD  ipratropium-albuterol  (DUONEB) 0.5-2.5 (3) MG/3ML SOLN Take 3 mLs by nebulization every 6 (six) hours as needed. Patient not taking: Reported on 07/16/2024    [provider]  levocetirizine (XYZAL ) 5 MG tablet TAKE 1 TABLET BY MOUTH TWICE A DAY 03/15/22   Luke Orlan HERO, DO  montelukast  (SINGULAIR ) 10 MG tablet Take 1 tablet (10 mg total) by mouth at bedtime. 03/14/24   Vicci Barnie NOVAK, MD  Multiple Vitamins-Minerals (BARIATRIC MULTIVITAMINS/IRON PO) Take 1 tablet by mouth.    [provider]  nitrofurantoin (MACRODANTIN) 50 MG capsule Take 50 mg by mouth daily as needed.    [provider]  Olopatadine  HCl  0.2 % SOLN 1 drop each eye daily PRN Patient not taking: Reported on 07/16/2024 11/09/23   Vicci Barnie NOVAK, MD  ondansetron  (ZOFRAN -ODT) 4 MG disintegrating tablet TAKE 1-2 TABLETS (4-8 MG TOTAL) BY MOUTH EVERY 8 (EIGHT) HOURS AS NEEDED FOR NAUSEA. APPOINTMENT NEEDED FOR FURTHER REFILLS. *INS LIMIT 07/28/21   Ines Onetha NOVAK, MD  oxyCODONE  (ROXICODONE ) 5 MG immediate release tablet Take 0.5-1 tablets (2.5-5 mg total) by mouth every 6 (six) hours as needed for up to 5 days for severe pain (pain score 7-10). 07/15/24 07/20/24  Trine Raynell Moder, MD   phentermine  37.5 MG capsule Take 1 capsule (37.5 mg total) by mouth daily. 05/22/24   Vicci Barnie NOVAK, MD  Plecanatide  (TRULANCE ) 3 MG TABS Take 1 tablet (3 mg total) by mouth daily. 11/30/22   Vivienne Delon HERO, PA-C  predniSONE  (DELTASONE ) 20 MG tablet Take 2 tablets (40 mg total) by mouth daily with breakfast for 4 days. 07/15/24 07/19/24  Trine Raynell Moder, MD  propranolol (INDERAL) 10 MG tablet TAKE 1 TABLET BY MOUTH TWICE A DAY AS NEEDED 07/20/23   Vicci Barnie NOVAK, MD  rizatriptan  (MAXALT -MLT) 10 MG disintegrating tablet TAKE 1 TABLET BY MOUTH AS NEEDED FOR MIGRAINE. MAY REPEAT IN 2 HOURS IF NEEDEDSTRENGTH: 10 MG 07/01/24   Vicci Barnie NOVAK, MD  Ruxolitinib Phosphate  (OPZELURA ) 1.5 % CREA Apply topically twice daily to affected areas. 07/05/24   Raymund Lauraine BROCKS, MD  sertraline (ZOLOFT) 50 MG tablet Take by mouth. 04/29/23   [provider]  tacrolimus  (PROTOPIC ) 0.1 % ointment Apply 2 grams twice daily to affected areas of skin 07/05/24   Raymund Lauraine BROCKS, MD  topiramate  (TOPAMAX ) 50 MG tablet Take 1 tablet (50 mg total) by mouth at bedtime. 05/22/24   Vicci Barnie NOVAK, MD  triamcinolone  ointment (KENALOG ) 0.1 % Apply 1 application topically 2 (two) times daily. 07/30/19   Bobbitt, Elgin Pepper, MD  valsartan  (DIOVAN ) 40 MG tablet TAKE 1 TABLET BY MOUTH EVERY DAY 12/09/23   Vicci Barnie NOVAK, MD  verapamil  (CALAN ) 80 MG tablet TAKE 1 TABLET BY MOUTH 3 (THREE) TIMES DAILY. 06/21/24   Vicci Barnie NOVAK, MD    Allergies: Bee venom, Peanut-containing drug products, Pollen extract, Shellfish allergy, Wasp venom, Xolair [omalizumab], Amoxicillin, Bug itch releaf [misc natural products], Contrave [naltrexone-bupropion hcl er], Corn-containing products, Dust mite extract, Gluten meal, Iodine, Lidocaine, Orange fruit [citrus], Penicillins, Sesame oil, Soy allergy (obsolete), Tetracaine, and Tomato    Review of Systems  Updated Vital Signs BP (!) 139/95   Pulse 90   Temp (!) 97.5  F (36.4 C) (Oral)   Resp 18   SpO2 100%   Physical Exam  (all labs ordered are listed, but only abnormal results are displayed) Labs Reviewed  CBC - Abnormal; Notable for the following components:      Result Value   Hemoglobin 11.8 (*)    All other components within normal limits  BASIC METABOLIC PANEL WITH GFR - Abnormal; Notable for the following components:   Chloride 97 (*)    Glucose, Bld 119 (*)    All other components within normal limits  CK    EKG: None  Radiology: UE VENOUS DUPLEX (7am - 7pm) Result Date: 07/16/2024 UPPER VENOUS STUDY  Patient Name:  Carla Buck  Date of Exam:   07/16/2024 Medical Rec #: [id number]                Accession #:    [id number] Date of Birth: Dec 17, 1980  Patient Gender: F Patient Age:   58 years Exam Location:  [facility] Procedure:      VAS US  UPPER EXTREMITY VENOUS DUPLEX Referring Phys: Giavonni Fonder --------------------------------------------------------------------------------  Indications: Swelling Risk Factors: Trauma. Comparison Study: No prior studies. Performing Technologist: Cordella Collet RVT  Examination Guidelines: A complete evaluation includes B-mode imaging, spectral Doppler, color Doppler, and power Doppler as needed of all accessible portions of each vessel. Bilateral testing is considered an integral part of a complete examination. Limited examinations for reoccurring indications may be performed as noted.  Right Findings: +----------+------------+---------+-----------+----------+-------+ RIGHT     CompressiblePhasicitySpontaneousPropertiesSummary +----------+------------+---------+-----------+----------+-------+ IJV           Full       Yes       Yes                      +----------+------------+---------+-----------+----------+-------+ Subclavian               Yes       Yes                      +----------+------------+---------+-----------+----------+-------+ Axillary       Full       Yes       Yes                      +----------+------------+---------+-----------+----------+-------+ Brachial      Full                                          +----------+------------+---------+-----------+----------+-------+ Radial        Full                                          +----------+------------+---------+-----------+----------+-------+ Ulnar         Full                                          +----------+------------+---------+-----------+----------+-------+ Cephalic    Partial                                  Acute  +----------+------------+---------+-----------+----------+-------+ Basilic       Full                                          +----------+------------+---------+-----------+----------+-------+ Thrombus detected in the cephalic vein is noted only in the [facility].  Left Findings: +----------+------------+---------+-----------+----------+-------+ LEFT      CompressiblePhasicitySpontaneousPropertiesSummary +----------+------------+---------+-----------+----------+-------+ Subclavian               Yes       Yes                      +----------+------------+---------+-----------+----------+-------+  Summary:  Right: No evidence of deep vein thrombosis in the upper extremity. Findings consistent with acute superficial vein thrombosis involving the right cephalic vein.  Left: No evidence of thrombosis in the subclavian.  *See table(s) above for measurements and observations.  Preliminary      Procedures   Medications Ordered in the ED - No data to display  Clinical Course as of 07/16/24 1901  Mon Jul 16, 2024  1703 Doppler study negative for DVT.  Cephalic vein thrombus noted [JK]  1826 CK CK normal.  CBC normal.  Metabolic panel normal. [JK]    Clinical Course User Index [JK] Randol Simmonds, MD                                 Medical Decision Making Problems Addressed: Neuropathy: acute illness or  injury Superficial vein thrombosis: acute illness or injury  Amount and/or Complexity of Data Reviewed Labs: ordered. Decision-making details documented in ED Course. Radiology: ordered.  Notes reviewed from the patient's doctor's visit today.  She was sent to possibly see a vascular surgeon with concerns of vascular compromise.  However on my exam patient has a strong radial pulse.  I suspect her brachial pulses difficult to palpate because of the edema in that area.  She does not have any evidence of distal cyanosis or acute vascular occlusion  Patient presented to the ER with complaints of persistent pain in her arm after extravasation of epinephrine  from an IV placed in the field.  On exam patient has a strong radial pulse.  There is no signs of acute arterial occlusion of her right upper extremity.  Her brachial artery is difficult to palpate but I think this is partly related to the edema and swelling that she has in that area.  Patient's ultrasound does not show any signs of deep venous thrombosis.  There is a superficial clot in the cephalic vein.  This certainly could be contributing to her pain and discomfort.  Patient also has been having intermittent numbness in that area.  I suspect this could be related to the nerve injury associated with the misplaced IV.  Outpatient referral placed to neurology.  Discussed use of aspirin to help treat her superficial vein thrombosis.     Final diagnoses:  Superficial vein thrombosis  Neuropathy    ED Discharge Orders          Ordered    Ambulatory referral to Neurology       Comments: An appointment is requested in approximately: 2 weeks   07/16/24 1859               Randol Simmonds, MD 07/16/24 1901

## 2024-07-16 NOTE — ED Triage Notes (Signed)
 PT ambulatory to triage with complaints of pain and weakness in the RIGHT arm. PT reports suffering IV epinephrine  extravasation in the RIGHT AC while being treated for anaphylaxis.   Pt has palpable equal radial pulses.

## 2024-07-16 NOTE — Progress Notes (Signed)
 Right upper extremity venous duplex has been completed. Preliminary results can be found in CV Proc through chart review.  Results were given to Dr. Randol.  07/16/24 5:01 PM Cathlyn Collet RVT

## 2024-07-16 NOTE — Discharge Instructions (Addendum)
 Take an aspirin daily to help with the superficial vein clot in your arm.  Consider following up with your primary care doctor or neurologist regarding the nerve injury in the arm.  I have placed a referral to a neurologist.

## 2024-07-16 NOTE — Patient Instructions (Signed)
  VISIT SUMMARY: You had a follow-up appointment today after experiencing an anaphylactic episode on July 14, 2024. During the episode, you had symptoms like lip swelling, itching, and throat tightness. You used an EpiPen , Benadryl , and an albuterol  inhaler, but these did not help, so you called EMS. In the ER, you were treated with various medications and discharged with prescriptions. Since then, you have had pain, weakness, and numbness in your right arm. You are unsure what triggered the allergic reaction.  YOUR PLAN: -RIGHT UPPER EXTREMITY PAIN, WEAKNESS, AND NUMBNESS AFTER EPINEPHRINE  EXTRAVASATION: You have ongoing pain, weakness, and numbness in your right arm after the epinephrine  treatment. This could be due to the medication affecting your nerves or blood vessels. You should go back to the emergency room to see a vascular surgeon. Continue using warm compresses and keep your arm elevated.  -ANAPHYLAXIS (RESOLVED): Anaphylaxis is a severe allergic reaction that can be life-threatening. Your recent episode has resolved, but the cause is still unknown. You will be referred to an allergist for further evaluation and management.  -MULTIPLE ALLERGIES: You have multiple allergies and have had bad reactions to allergy shots in the past. Given your recent anaphylactic episode, you will need to follow up with an allergist for further evaluation and management. Continue with your current allergy management plan.  INSTRUCTIONS: Please return to the emergency room for a vascular surgeon evaluation for your right arm. Follow up with an allergist for further evaluation and management of your allergies and to determine the cause of your recent anaphylactic episode.                      Contains text generated by [employer]bridge.                                 Contains text generated by [employer]bridge.

## 2024-07-16 NOTE — Telephone Encounter (Signed)
 Called & spoke to the patient. Verified name & DOB. Patient confirmed that she would like to come in today 07/16/24 for an appointment. Patient confirmed today's visit. All questions and concerns will be addressed at that time. No further assistance needed at this time.
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
## 2024-07-17 ENCOUNTER — Encounter: Payer: Self-pay | Admitting: Internal Medicine

## 2024-07-19 ENCOUNTER — Encounter: Payer: Self-pay | Admitting: Internal Medicine

## 2024-07-19 ENCOUNTER — Other Ambulatory Visit: Payer: Self-pay

## 2024-07-19 ENCOUNTER — Ambulatory Visit: Payer: Self-pay | Admitting: Allergy

## 2024-07-19 ENCOUNTER — Encounter: Payer: Self-pay | Admitting: Allergy

## 2024-07-19 ENCOUNTER — Encounter: Payer: Self-pay | Admitting: Neurology

## 2024-07-19 VITALS — BP 122/92 | HR 81 | Temp 98.3°F | Resp 16 | Ht 64.57 in | Wt 240.2 lb

## 2024-07-19 DIAGNOSIS — T7800XD Anaphylactic reaction due to unspecified food, subsequent encounter: Secondary | ICD-10-CM | POA: Diagnosis not present

## 2024-07-19 DIAGNOSIS — J3089 Other allergic rhinitis: Secondary | ICD-10-CM

## 2024-07-19 DIAGNOSIS — J4520 Mild intermittent asthma, uncomplicated: Secondary | ICD-10-CM | POA: Diagnosis not present

## 2024-07-19 DIAGNOSIS — T7840XD Allergy, unspecified, subsequent encounter: Secondary | ICD-10-CM | POA: Diagnosis not present

## 2024-07-19 DIAGNOSIS — T7840XA Allergy, unspecified, initial encounter: Secondary | ICD-10-CM

## 2024-07-19 DIAGNOSIS — T7800XA Anaphylactic reaction due to unspecified food, initial encounter: Secondary | ICD-10-CM

## 2024-07-19 DIAGNOSIS — Z889 Allergy status to unspecified drugs, medicaments and biological substances status: Secondary | ICD-10-CM

## 2024-07-19 DIAGNOSIS — L509 Urticaria, unspecified: Secondary | ICD-10-CM

## 2024-07-19 DIAGNOSIS — L2089 Other atopic dermatitis: Secondary | ICD-10-CM

## 2024-07-19 MED ORDER — FAMOTIDINE 20 MG PO TABS: 20.0000 mg | ORAL_TABLET | Freq: Two times a day (BID) | ORAL | 2 refills | Status: AC
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
## 2024-07-19 NOTE — Patient Instructions (Addendum)
 Allergic reaction Unclear cause.  Start to avoid mammalian meat - no beef, pork, lamb.  Continue to avoid peanuts, tree nuts, seafood, tomatoes, oranges as before.  For mild symptoms you can take over the counter antihistamines (zyrtec 10mg  to 20mg ) and monitor symptoms closely.  If symptoms worsen or if you have severe symptoms including breathing issues, throat closure, significant swelling, whole body hives, severe diarrhea and vomiting, lightheadedness then use epinephrine  and seek immediate medical care afterwards. Emergency action plan given.  Get bloodwork We are ordering labs, so please allow 1-2 weeks for the results to come back. With the newly implemented Cures Act, the labs might be visible to you at the same time that they become visible to me. However, I will not address the results until all of the results are back, so please be patient.  In the meantime, continue recommendations in your patient instructions, including avoidance measures (if applicable), until you hear from me.  Start allegra (fexofenadine) 180mg  in the morning. Continue Xyzal  5mg  in the evening.  If symptoms are not controlled or causes drowsiness let us  know. Start Pepcid  (famotidine ) 20mg  twice a day.  Avoid the following potential triggers: alcohol, tight clothing, NSAIDs, hot showers and getting overheated. See below for proper skin care.   Asthma Normal breathing test.  May use albuterol  rescue inhaler 2 puffs very 4 to 6 hours as needed for shortness of breath, chest tightness, coughing, and wheezing.  Monitor frequency of use - if you need to use it more than twice per week on a consistent basis let us  know.   Environmental allergies Take antihistamines as above. Get labs. Use Flonase (fluticasone) nasal spray 1-2 sprays per nostril once a day as needed for nasal congestion.  Nasal saline spray (i.e., Simply Saline) or nasal saline lavage (i.e., NeilMed) is recommended as needed and prior to  medicated nasal sprays.  Skin  Keep track of rashes and take pictures. Write down what you had done during flares.  See below for proper skin care. Use fragrance free and dye free products. No dryer sheets or fabric softener.   Keep follow up with dermatology.   Hold phentermine .   Follow up in 3 weeks or sooner if needed.

## 2024-07-23 ENCOUNTER — Ambulatory Visit: Admitting: Internal Medicine

## 2024-07-23 ENCOUNTER — Other Ambulatory Visit: Payer: Self-pay | Admitting: Allergy

## 2024-07-25 NOTE — Telephone Encounter (Signed)
 Opzelura  sent in at last appt. aw

## 2024-07-27 ENCOUNTER — Encounter: Payer: Self-pay | Admitting: Allergy

## 2024-07-29 ENCOUNTER — Encounter: Payer: Self-pay | Admitting: Allergy

## 2024-07-29 LAB — ALLERGEN PROFILE, SHELLFISH
Clam IgE: <0.1 kU/L
F023-IgE Crab: 0.26 kU/L — AB
F080-IgE Lobster: <0.1 kU/L
F290-IgE Oyster: <0.1 kU/L

## 2024-07-29 LAB — HYMENOPTERA VENOM ALLERGY II
Bumblebee: <0.1 kU/L
Hornet, White Face, IgE: 0.55 kU/L — AB
Hornet, Yellow, IgE: 0.55 kU/L — AB
I001-IgE Honeybee: <0.1 kU/L
I003-IgE Yellow Jacket: 0.35 kU/L — AB
I004-IgE Paper Wasp: 0.16 kU/L — AB
I208-IgE Api m 1: <0.1 kU/L
I209-IgE Ves v 5: <0.1 kU/L
I210-IgE Pol d 5: <0.1 kU/L
I211-IgE Ves v 1: <0.1 kU/L
I214-IgE Api m 2: <0.1 kU/L
I215-IgE Api m 3: <0.1 kU/L
I216-IgE Api m 5: <0.1 kU/L
I217-IgE Api m 10: <0.1 kU/L
Tryptase: 5.1 ug/L (ref 2.2–13.2)

## 2024-07-29 LAB — CHRONIC URTICARIA (CU) EVAL
ALT: 23 IU/L (ref 0–32)
Basophils Absolute: 0 x10E3/uL (ref 0.0–0.2)
Basos: 1 %
CRP: 7 mg/L (ref 0–10)
EOS (ABSOLUTE): 0.2 x10E3/uL (ref 0.0–0.4)
Eos: 3 %
Hematocrit: 35.8 % (ref 34.0–46.6)
Hemoglobin: 11.2 g/dL (ref 11.1–15.9)
Immature Grans (Abs): 0 x10E3/uL (ref 0.0–0.1)
Immature Granulocytes: 0 %
Lymphocytes Absolute: 2.8 x10E3/uL (ref 0.7–3.1)
Lymphs: 44 %
MCH: 28.9 pg (ref 26.6–33.0)
MCHC: 31.3 g/dL — ABNORMAL LOW (ref 31.5–35.7)
MCV: 92 fL (ref 79–97)
Monocytes Absolute: 0.5 x10E3/uL (ref 0.1–0.9)
Monocytes: 8 %
Neutrophils Absolute: 2.8 x10E3/uL (ref 1.4–7.0)
Neutrophils: 44 %
Platelets: 411 x10E3/uL (ref 150–450)
Pooled Donor- BAT CU: 3.88 % (ref 0.00–10.60)
RBC: 3.88 x10E6/uL (ref 3.77–5.28)
RDW: 13.4 % (ref 11.7–15.4)
Sed Rate: 13 mm/h (ref 0–32)
TSH: 2.36 u[IU]/mL (ref 0.450–4.500)
Thyroperoxidase Ab SerPl-aCnc: <9 [IU]/mL (ref 0–34)
WBC: 6.2 x10E3/uL (ref 3.4–10.8)

## 2024-07-29 LAB — PANEL 604726
Cor A 1 IgE: <0.1 kU/L
Cor A 14 IgE: <0.1 kU/L
Cor A 8 IgE: <0.1 kU/L
Cor A 9 IgE: <0.1 kU/L

## 2024-07-29 LAB — COMPREHENSIVE METABOLIC PANEL WITH GFR
AST: 27 IU/L (ref 0–40)
Albumin: 3.9 g/dL (ref 3.9–4.9)
Alkaline Phosphatase: 83 IU/L (ref 41–116)
BUN/Creatinine Ratio: 17 (ref 9–23)
BUN: 14 mg/dL (ref 6–24)
Bilirubin Total: <0.2 mg/dL (ref 0.0–1.2)
CO2: 24 mmol/L (ref 20–29)
Calcium: 8.9 mg/dL (ref 8.7–10.2)
Chloride: 102 mmol/L (ref 96–106)
Creatinine, Ser: 0.84 mg/dL (ref 0.57–1.00)
Globulin, Total: 2.5 g/dL (ref 1.5–4.5)
Glucose: 56 mg/dL — ABNORMAL LOW (ref 70–99)
Potassium: 3.7 mmol/L (ref 3.5–5.2)
Sodium: 138 mmol/L (ref 134–144)
Total Protein: 6.4 g/dL (ref 6.0–8.5)
eGFR: 89 mL/min/1.73 (ref >59)

## 2024-07-29 LAB — ALLERGY PROFILE, FOOD
Allergen Salmon IgE: <0.1 kU/L
Codfish IgE: <0.1 kU/L
F001-IgE Egg White: <0.1 kU/L
F002-IgE Milk: <0.1 kU/L
F004-IgE Wheat: 0.42 kU/L — AB
F010-IgE Sesame Seed: 0.17 kU/L — AB
F017-IgE Hazelnut (Filbert): 0.18 kU/L — AB
F018-IgE Brazil Nut: <0.1 kU/L
F020-IgE Almond: 0.2 kU/L — AB
F202-IgE Cashew Nut: <0.1 kU/L
F256-IgE Walnut: 0.21 kU/L — AB
F416-IgE Tri a 19(w-5 gliadin): <0.1 kU/L
Peanut, IgE: 0.39 kU/L — AB
Scallop IgE: 0.19 kU/L — AB
Shrimp IgE: 0.26 kU/L — AB
Soybean IgE: 0.14 kU/L — AB
Tuna: <0.1 kU/L

## 2024-07-29 LAB — ALLERGEN PROFILE WITH TOTAL IGE, RESPIRATORY-AREA 2
Alternaria Alternata IgE: <0.1 kU/L
Aspergillus Fumigatus IgE: <0.1 kU/L
Bermuda Grass IgE: 1.11 kU/L — AB
Cat Dander IgE: 0.34 kU/L — AB
Cedar, Mountain IgE: 7.73 kU/L — AB
Cladosporium Herbarum IgE: <0.1 kU/L
Cockroach, German IgE: 0.93 kU/L — AB
Common Silver Birch IgE: 0.88 kU/L — AB
Cottonwood IgE: 0.74 kU/L — AB
D Farinae IgE: 18.1 kU/L — AB
D Pteronyssinus IgE: 15.5 kU/L — AB
Dog Dander IgE: 0.22 kU/L — AB
Elm, American IgE: 1.1 kU/L — AB
Johnson Grass IgE: 0.31 kU/L — AB
Maple/Box Elder IgE: 1.44 kU/L — AB
Mouse Urine IgE: <0.1 kU/L
Oak, White IgE: 1.23 kU/L — AB
Pecan, Hickory IgE: 0.83 kU/L — AB
Penicillium Chrysogen IgE: <0.1 kU/L
Pigweed, Rough IgE: 0.75 kU/L — AB
Ragweed, Short IgE: 1.35 kU/L — AB
Sheep Sorrel IgE Qn: 0.95 kU/L — AB
Timothy Grass IgE: 0.5 kU/L — AB
White Mulberry IgE: 0.12 kU/L — AB

## 2024-07-29 LAB — ALLERGEN COMPONENT COMMENTS

## 2024-07-29 LAB — F433-IGE TRI A 14 (NSLTP): F433-IgE Tri a 14 (nsLTP): <0.1 kU/L

## 2024-07-29 LAB — F098-IGE GLIADIN, WHEAT 607953: F098-IgE Gliadin, Wheat: <0.1 kU/L

## 2024-07-29 LAB — PEANUT COMPONENTS
F352-IgE Ara h 8: <0.1 kU/L
F422-IgE Ara h 1: <0.1 kU/L
F423-IgE Ara h 2: <0.1 kU/L
F424-IgE Ara h 3: <0.1 kU/L
F427-IgE Ara h 9: <0.1 kU/L
F447-IgE Ara h 6: <0.1 kU/L

## 2024-07-29 LAB — ALLERGEN PROFILE, FOOD-FISH
Allergen Mackerel IgE: <0.1 kU/L
Allergen Trout IgE: <0.1 kU/L
Allergen Walley Pike IgE: <0.1 kU/L
Halibut IgE: <0.1 kU/L

## 2024-07-29 LAB — IGE NUT PROF. W/COMPONENT RFLX
F203-IgE Pistachio Nut: 0.36 kU/L — AB
Macadamia Nut, IgE: 0.12 kU/L — AB
Pecan Nut IgE: <0.1 kU/L

## 2024-07-29 LAB — ALLERGEN COCONUT IGE: Allergen Coconut IgE: 0.22 kU/L — AB

## 2024-07-29 LAB — C3 AND C4
Complement C3, Serum: 139 mg/dL (ref 82–167)
Complement C4, Serum: 37 mg/dL (ref 12–38)

## 2024-07-29 LAB — PANEL 604721
Jug R 1 IgE: <0.1 kU/L
Jug R 3 IgE: <0.1 kU/L

## 2024-07-29 LAB — F449-IGE SES I 1 607742: F449-IgE Ses i 1: <0.1 kU/L

## 2024-07-29 LAB — ALPHA-GAL PANEL
Allergen Lamb IgE: <0.1 kU/L
Beef IgE: 0.77 kU/L — AB
IgE (Immunoglobulin E), Serum: 78 [IU]/mL (ref 6–495)
O215-IgE Alpha-Gal: <0.1 kU/L
Pork IgE: 0.18 kU/L — AB

## 2024-07-29 LAB — O214-IGE CCD DETERMINANTS: O214-IgE CCD Determinants: <0.1 kU/L

## 2024-07-29 LAB — ALLERGEN, ORANGE F33: Orange: 0.25 kU/L — AB

## 2024-07-29 LAB — ALLERGEN FIRE ANT: I070-IgE Fire Ant (Invicta): 0.39 kU/L — AB

## 2024-07-29 LAB — ANTINUCLEAR ANTIBODIES, IFA: ANA Titer 1: POSITIVE — AB

## 2024-07-29 LAB — G212-IGE PHL P 12 (PROFILIN): G212-IgE Phl p 12 (Profilin): 0.99 kU/L — AB

## 2024-07-29 LAB — FANA STAINING PATTERNS: Speckled Pattern: 1:640 {titer} — ABNORMAL HIGH

## 2024-07-29 LAB — ALLERGEN, TOMATO F25: Allergen Tomato, IgE: 0.5 kU/L — AB

## 2024-07-31 ENCOUNTER — Encounter: Payer: Self-pay | Admitting: Neurology

## 2024-07-31 ENCOUNTER — Ambulatory Visit: Admitting: Neurology

## 2024-07-31 VITALS — BP 112/78 | HR 91 | Ht 64.57 in | Wt 242.0 lb

## 2024-07-31 DIAGNOSIS — R202 Paresthesia of skin: Secondary | ICD-10-CM

## 2024-07-31 DIAGNOSIS — M79601 Pain in right arm: Secondary | ICD-10-CM

## 2024-07-31 MED ORDER — GABAPENTIN 300 MG PO CAPS: 300.0000 mg | ORAL_CAPSULE | Freq: Every day | ORAL | 3 refills | Status: AC

## 2024-07-31 NOTE — Patient Instructions (Addendum)
 Start gabapentin  300mg  at bedtime  Referral for physical therapy  Nerve testing of the right arm  ELECTROMYOGRAM AND NERVE CONDUCTION STUDIES (EMG/NCS) INSTRUCTIONS  How to Prepare The neurologist conducting the EMG will need to know if you have certain medical conditions. Tell the neurologist and other EMG lab personnel if you: Have a pacemaker or any other electrical medical device Take blood-thinning medications Have hemophilia, a blood-clotting disorder that causes prolonged bleeding Bathing Take a shower or bath shortly before your exam in order to remove oils from your skin. Dont apply lotions or creams before the exam.  What to Expect Youll likely be asked to change into a hospital gown for the procedure and lie down on an examination table. The following explanations can help you understand what will happen during the exam.  Electrodes. The neurologist or a technician places surface electrodes at various locations on your skin depending on where youre experiencing symptoms. Or the neurologist may insert needle electrodes at different sites depending on your symptoms.  Sensations. The electrodes will at times transmit a tiny electrical current that you may feel as a twinge or spasm. The needle electrode may cause discomfort or pain that usually ends shortly after the needle is removed. If you are concerned about discomfort or pain, you may want to talk to the neurologist about taking a short break during the exam.  Instructions. During the needle EMG, the neurologist will assess whether there is any spontaneous electrical activity when the muscle is at rest - activity that isnt present in healthy muscle tissue - and the degree of activity when you slightly contract the muscle.  He or she will give you instructions on resting and contracting a muscle at appropriate times. Depending on what muscles and nerves the neurologist is examining, he or she may ask you to change positions during  the exam.  After your EMG You may experience some temporary, minor bruising where the needle electrode was inserted into your muscle. This bruising should fade within several days. If it persists, contact your primary care doctor.

## 2024-07-31 NOTE — Progress Notes (Signed)
 [facility] Clinic Note - Initial Visit   Date: 07/31/2024   Carla Buck MRN: [id number] DOB: 05/05/1981   Dear Dr. Randol:  Thank you for your kind referral of Carla Buck for consultation of left arm pain and numbness. Although her history is well known to you, please allow us  to reiterate it for the purpose of our medical record. The patient was accompanied to the clinic by self.   Carla Buck is a 43 y.o. right-handed female with asthma, allergy, hypertension, depression/anxiety, and migraines presenting for evaluation of left arm pain and numbness.   IMPRESSION/PLAN: Assessment & Plan Right upper limb pain and paresthesias due to drug extravasation.  Intermittent numbness, tingling, and pain in the right arm post-epinephrine  extravasation.   Neurological exam shows sensory loss to temperature and pin prick over the right palm, with intact sensation in the forearm and fingers.  Motor strength and reflexes are intact. Good prognosis with expected improvement. Nerve testing recommended to localize symptoms. - NCS/EMG of the right arm - Start gabapentin  300mg  at bedtime for pain - Referred to physical therapy.  Further recommendations pending results.   ------------------------------------------------------------- History of present illness: Discussed the use of AI scribe software for clinical note transcription with the patient, who gave verbal consent to proceed.  History of Present Illness Carla Buck is a 43 year old female who presents with numbness and pain in the right arm following epinephrine  extravasation.  She experiences numbness and tingling in her right arm, primarily affecting her fingers, following an epinephrine  extravasation which was administered to treat an allergic reaction on 11/29.  The numbness and tingling are accompanied by pain that radiates from her medial elbow up her neck and  down into the hand.  The pain is described as achy and sharp, particularly over the neck and shoulder area, with numbness and tingling in the fingers. 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  Occasionally, she takes half a dose of oxycodone  when the pain is severe, but she is concerned about its use due to her active lifestyle and family responsibilities, including caring for her three children, one of whom has autism.  The initial weakness in her arm has improved since the incident, but she still experiences intermittent pain and weakness, impacting her daily activities, such as draining spaghetti noodles.   Out-side paper records, electronic medical record, and images have been reviewed where available and summarized as:  Lab Results  Component Value Date   CKTOTAL 120 07/16/2024    No results found for: HGBA1C Lab Results  Component Value Date   VITAMINB12 658 06/18/2019   Lab Results  Component Value Date   TSH 2.360 07/23/2024   Lab Results  Component Value Date   ESRSEDRATE 13 07/23/2024    Past Medical History:  Diagnosis Date   Allergy    Anxiety    Asthma    Depression    Eczema    Hearing impairment    Wears hearing aids   Hypertension    Migraine    Urticaria     Past Surgical History:  Procedure Laterality Date   ABDOMINAL HYSTERECTOMY  11/2015   fibroids   BREAST LUMPECTOMY     BREAST LUMPECTOMY Right 11/14/2019   GASTRIC BYPASS     left wrist surgegy   11/2022   OOPHORECTOMY Left 09/2018   torsion     Medications:  Outpatient  Encounter Medications as of 07/31/2024  Medication Sig   acyclovir  (ZOVIRAX ) 400 MG tablet TAKE 1 TABLET BY MOUTH TWICE A DAY   albuterol  (VENTOLIN  HFA) 108 (90 Base) MCG/ACT inhaler Inhale 2 puffs every 4-6 hours as needed for cough, wheeze, tightness in chest, shortness of breath   busPIRone (BUSPAR) 5 MG tablet Take 5 mg by mouth in the morning  and at bedtime.   clobetasol  ointment (TEMOVATE ) 0.05 % Apply 1 gram topically to affected area of skin twice daily. Stop once resolved and restart as needed for flares. Avoid use on face, armpits, groin unless otherwise indicated.   diphenhydrAMINE  (BENADRYL ) 25 mg capsule Take 25 mg by mouth every 6 (six) hours as needed.   EPINEPHrine  0.3 mg/0.3 mL IJ SOAJ injection INJECT 0.3 MG INTO THE MUSCLE AS NEEDED FOR ANAPHYLAXIS.   famotidine  (PEPCID ) 20 MG tablet Take 1 tablet (20 mg total) by mouth 2 (two) times daily.   ferrous sulfate 325 (65 FE) MG tablet Take 325 mg by mouth daily with breakfast.   hydrochlorothiazide  (HYDRODIURIL ) 25 MG tablet TAKE 1 TABLET (25 MG TOTAL) BY MOUTH DAILY.   levocetirizine (XYZAL ) 5 MG tablet TAKE 1 TABLET BY MOUTH TWICE A DAY   montelukast  (SINGULAIR ) 10 MG tablet Take 1 tablet (10 mg total) by mouth at bedtime.   Multiple Vitamins-Minerals (BARIATRIC MULTIVITAMINS/IRON PO) Take 1 tablet by mouth.   nitrofurantoin (MACRODANTIN) 50 MG capsule Take 50 mg by mouth daily as needed.   ondansetron  (ZOFRAN -ODT) 4 MG disintegrating tablet TAKE 1-2 TABLETS (4-8 MG TOTAL) BY MOUTH EVERY 8 (EIGHT) HOURS AS NEEDED FOR NAUSEA. APPOINTMENT NEEDED FOR FURTHER REFILLS. *INS LIMIT   phentermine  37.5 MG capsule Take 1 capsule (37.5 mg total) by mouth daily.   Plecanatide  (TRULANCE ) 3 MG TABS Take 1 tablet (3 mg total) by mouth daily.   propranolol (INDERAL) 10 MG tablet TAKE 1 TABLET BY MOUTH TWICE A DAY AS NEEDED   rizatriptan  (MAXALT -MLT) 10 MG disintegrating tablet TAKE 1 TABLET BY MOUTH AS NEEDED FOR MIGRAINE. MAY REPEAT IN 2 HOURS IF NEEDEDSTRENGTH: 10 MG   sertraline (ZOLOFT) 50 MG tablet Take by mouth.   tacrolimus  (PROTOPIC ) 0.1 % ointment Apply 2 grams twice daily to affected areas of skin   topiramate  (TOPAMAX ) 50 MG tablet Take 1 tablet (50 mg total) by mouth at bedtime.   triamcinolone  ointment (KENALOG ) 0.1 % Apply 1 application topically 2 (two) times daily.    valsartan  (DIOVAN ) 40 MG tablet TAKE 1 TABLET BY MOUTH EVERY DAY   verapamil  (CALAN ) 80 MG tablet TAKE 1 TABLET BY MOUTH 3 (THREE) TIMES DAILY.   Olopatadine  HCl 0.2 % SOLN 1 drop each eye daily PRN (Patient not taking: Reported on 07/31/2024)   No facility-administered encounter medications on file as of 07/31/2024.    Allergies: Allergies[1]  Family History: Family History  Problem Relation Age of Onset   Hypertension Mother    Allergic rhinitis Mother    Asthma Mother    Eczema Mother    Depression Father    Asthma Father    Bipolar disorder Sister    Asthma Sister    Hypertension Maternal Grandmother    Hypertension Paternal Grandmother    Diabetes Paternal Grandmother    Hypertension Paternal Grandfather    Migraines Neg Hx     Social History: Social History[2] Social History   Social History Narrative   Lives at home with her children    Right handed   Caffeine: 2-3 cups/day  Are you right handed or left handed? Right handed    Are you currently employed ? 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 No    Who lives with you? Husband    What type of home do you live in: 1 story or 2 story? Lives in a one story home        Vital Signs:  BP 112/78   Pulse 91   Ht 5' 4.57 (1.64 m)   Wt 242 lb (109.8 kg)   SpO2 100%   BMI 40.81 kg/m    Neurological Exam: MENTAL STATUS including orientation to time, place, person, recent and remote memory, attention span and concentration, language, and fund of knowledge is normal.  Speech is not dysarthric.  CRANIAL NERVES: II:  No visual field defects.     III-IV-VI: Pupils equal round and reactive to light.  Normal conjugate, extra-ocular eye movements in all directions of gaze.  No nystagmus.  No ptosis.   V:  Normal facial sensation.    VII:  Normal facial symmetry and movements.   VIII:  Normal hearing and vestibular function.   IX-X:  Normal palatal movement.   XI:   Normal shoulder shrug and head rotation.   XII:  Normal tongue strength and range of motion, no deviation or fasciculation.  MOTOR:  No atrophy, fasciculations or abnormal movements.  No pronator drift.   Upper Extremity:  Right  Left  Deltoid  5/5   5/5   Biceps  5/5   5/5   Triceps  5/5   5/5   Wrist extensors  5/5   5/5   Wrist flexors  5/5   5/5   Finger extensors  5/5   5/5   Finger flexors  5/5   5/5   Dorsal interossei  5/5   5/5   Abductor pollicis  5/5   5/5   Tone (Ashworth scale)  0  0   Lower Extremity:  Right  Left  Hip flexors  5/5   5/5   Knee flexors  5/5   5/5   Knee extensors  5/5   5/5   Dorsiflexors  5/5   5/5   Plantarflexors  5/5   5/5   Toe extensors  5/5   5/5   Toe flexors  5/5   5/5   Tone (Ashworth scale)  0  0   MSRs:                                           Right        Left brachioradialis 2+  2+  biceps 2+  2+  triceps 2+  2+  patellar 2+  2+  ankle jerk 2+  2+  Hoffman no  no  plantar response down  down   SENSORY:  Temperature and pin prick is reduced in the right palm, as compared to the left hand.  Sensation in the right forearm, upper arm, and fingers.  Vibration intact throughout  Romberg's sign absent.   COORDINATION/GAIT: Normal finger-to- nose-finger.  Intact rapid alternating movements bilaterally.  Able to rise from a chair without using arms.  Gait narrow based and stable. Tandem and stressed gait intact.    Thank you for allowing me to participate in patient's care.  If I can answer any  additional questions, I would be pleased to do so.    Sincerely,    Rossana Molchan K. Muslima Toppins, DO     [1]  Allergies Allergen Reactions   Bee Venom Anaphylaxis   Peanut-Containing Drug Products Anaphylaxis   Pollen Extract Shortness Of Breath    Tree and grass    Shellfish Allergy Anaphylaxis   Wasp Venom Anaphylaxis   Xolair [Omalizumab] Anaphylaxis   Amoxicillin Hives   Bug Itch Releaf [Misc Natural Products] Hives    Roaches, ants  and dustmites   Contrave [Naltrexone-Bupropion Hcl Er] Hives   Dust Mite Extract     Asthma trigger   Iodine Hives   Lidocaine Hives   Orange Fruit [Citrus] Hives   Penicillins Hives   Sesame Oil Diarrhea    GI upset   Tetracaine Hives   Tomato Hives  [2]  Social History Tobacco Use   Smoking status: Never   Smokeless tobacco: Never  Vaping Use   Vaping status: Never Used  Substance Use Topics   Alcohol use: Yes    Comment: occasional wine   Drug use: Never

## 2024-08-01 NOTE — Progress Notes (Unsigned)
 Follow Up Note  RE: Carla Buck MRN: [id number] DOB: 01/22/1981 Date of Office Visit: 08/02/2024  Referring provider: Vicci Barnie NOVAK, MD Primary care provider: Vicci Barnie NOVAK, MD  Chief Complaint: No chief complaint on file.  History of Present Illness: I had the pleasure of seeing Carla Buck for a follow up visit at the [facility] on 08/02/2024. She is a 43 y.o. female, who is being followed for allergic reactions, allergic rhinitis, food allergy, asthma, atopic dermatitis, multiple drug allergies. Her previous allergy office visit was on 07/19/2024 with Dr. Luke. Today is a new complaint visit of discuss lab results.  Discussed the use of AI scribe software for clinical note transcription with the patient, who gave verbal consent to proceed.  History of Present Illness            ***  Assessment and Plan: Carla Buck is a 43 y.o. female with: Allergic reaction, initial encounter Recent anaphylactic reaction required IM epi x 3 with facial hives, lip swelling, scratchy throat, and chest tightness. Previous reactions to peanuts, pecans, coconuts, and certain seafood. Etiology unclear but some concern about alpha-gal allergy.  Start to avoid mammalian meat - no beef, pork, lamb.  Continue to avoid peanuts, tree nuts, seafood, tomatoes, oranges as before. For mild symptoms you can take over the counter antihistamines (zyrtec  10mg  to 20mg ) and monitor symptoms closely.  If symptoms worsen or if you have severe symptoms including breathing issues, throat closure, significant swelling, whole body hives, severe diarrhea and vomiting, lightheadedness then use epinephrine  and seek immediate medical care afterwards. Emergency action plan given. Get bloodwork. Start allegra (fexofenadine) 180mg  in the morning. Continue Xyzal  5mg  in the evening.  If symptoms are not controlled or causes drowsiness let us  know. Start Pepcid  (famotidine ) 20mg   twice a day.  Avoid the following potential triggers: alcohol, tight clothing, NSAIDs, hot showers and getting overheated. See below for proper skin care.    Other allergic rhinitis Year-round symptoms due to dust mites, tree pollen, and grass pollens. Previously on allergy shots, now managed with antihistamines. Take antihistamines as above. Get labs. Use Flonase (fluticasone) nasal spray 1-2 sprays per nostril once a day as needed for nasal congestion.  Nasal saline spray (i.e., Simply Saline) or nasal saline lavage (i.e., NeilMed) is recommended as needed and prior to medicated nasal sprays.   Anaphylactic reaction due to food, initial encounter One episode of peanut exposure recently that required IM epi. No recent labs. Get labwork. Start to avoid mammalian meat - no beef, pork, lamb.  Continue to avoid peanuts, tree nuts, seafood, tomatoes, oranges as before.   Mild intermittent asthma without complication Well-controlled with infrequent use of rescue inhaler. Symptoms exacerbated by cold weather. Normal spirometry today. May use albuterol  rescue inhaler 2 puffs very 4 to 6 hours as needed for shortness of breath, chest tightness, coughing, and wheezing.  Monitor frequency of use - if you need to use it more than twice per week on a consistent basis let us  know.    Other atopic dermatitis Chronic eczema with recent outbreaks on face, hands, and feet. Managed with tacrolimus  for face and clobetasol  and triamcinolone  cream for hands and feet per dermatology.  Keep track of rashes and take pictures. Write down what you had done during flares.  See below for proper skin care. Use fragrance free and dye free products. No dryer sheets or fabric softener.   Keep follow up with dermatology.    Multiple  drug allergies Patient apparently had shortness of breath with first Xolair injection.  Continue to avoid medications on allergy list. Briefly discussed Xolair challenge if no other  cause for anaphylaxis is identified and they keep recurring.  Assessment and Plan              No follow-ups on file.  No orders of the defined types were placed in this encounter.  Lab Orders  No laboratory test(s) ordered today    Diagnostics: Spirometry:  Tracings reviewed. Her effort: {Blank single:19197::Good reproducible efforts.,It was hard to get consistent efforts and there is a question as to whether this reflects a maximal maneuver.,Poor effort, data can not be interpreted.} FVC: ***L FEV1: ***L, ***% predicted FEV1/FVC ratio: ***% Interpretation: {Blank single:19197::Spirometry consistent with mild obstructive disease,Spirometry consistent with moderate obstructive disease,Spirometry consistent with severe obstructive disease,Spirometry consistent with possible restrictive disease,Spirometry consistent with mixed obstructive and restrictive disease,Spirometry uninterpretable due to technique,Spirometry consistent with normal pattern,No overt abnormalities noted given today's efforts}.  Please see scanned spirometry results for details.  Skin Testing: {Blank single:19197::Select foods,Environmental allergy panel,Environmental allergy panel and select foods,Food allergy panel,None,Deferred due to recent antihistamines use}. *** Results discussed with patient/family.   Medication List:  Current Outpatient Medications  Medication Sig Dispense Refill   acyclovir  (ZOVIRAX ) 400 MG tablet TAKE 1 TABLET BY MOUTH TWICE A DAY 180 tablet 0   albuterol  (VENTOLIN  HFA) 108 (90 Base) MCG/ACT inhaler Inhale 2 puffs every 4-6 hours as needed for cough, wheeze, tightness in chest, shortness of breath 18 g 3   busPIRone (BUSPAR) 5 MG tablet Take 5 mg by mouth in the morning and at bedtime.     clobetasol  ointment (TEMOVATE ) 0.05 % Apply 1 gram topically to affected area of skin twice daily. Stop once resolved and restart as needed for flares. Avoid use  on face, armpits, groin unless otherwise indicated. 60 g 5   diphenhydrAMINE  (BENADRYL ) 25 mg capsule Take 25 mg by mouth every 6 (six) hours as needed.     EPINEPHrine  0.3 mg/0.3 mL IJ SOAJ injection INJECT 0.3 MG INTO THE MUSCLE AS NEEDED FOR ANAPHYLAXIS. 2 each 2   famotidine  (PEPCID ) 20 MG tablet Take 1 tablet (20 mg total) by mouth 2 (two) times daily. 60 tablet 2   ferrous sulfate 325 (65 FE) MG tablet Take 325 mg by mouth daily with breakfast.     gabapentin  (NEURONTIN ) 300 MG capsule Take 1 capsule (300 mg total) by mouth at bedtime. 30 capsule 3   hydrochlorothiazide  (HYDRODIURIL ) 25 MG tablet TAKE 1 TABLET (25 MG TOTAL) BY MOUTH DAILY. 90 tablet 1   levocetirizine (XYZAL ) 5 MG tablet TAKE 1 TABLET BY MOUTH TWICE A DAY 30 tablet 0   montelukast  (SINGULAIR ) 10 MG tablet Take 1 tablet (10 mg total) by mouth at bedtime. 90 tablet 1   Multiple Vitamins-Minerals (BARIATRIC MULTIVITAMINS/IRON PO) Take 1 tablet by mouth.     nitrofurantoin (MACRODANTIN) 50 MG capsule Take 50 mg by mouth daily as needed.     Olopatadine  HCl 0.2 % SOLN 1 drop each eye daily PRN (Patient not taking: Reported on 07/31/2024) 2.5 mL 1   ondansetron  (ZOFRAN -ODT) 4 MG disintegrating tablet TAKE 1-2 TABLETS (4-8 MG TOTAL) BY MOUTH EVERY 8 (EIGHT) HOURS AS NEEDED FOR NAUSEA. APPOINTMENT NEEDED FOR FURTHER REFILLS. *INS LIMIT 18 tablet 1   phentermine  37.5 MG capsule Take 1 capsule (37.5 mg total) by mouth daily. 30 capsule 1   Plecanatide  (TRULANCE ) 3 MG TABS Take 1 tablet (  3 mg total) by mouth daily. 30 tablet 0   propranolol (INDERAL) 10 MG tablet TAKE 1 TABLET BY MOUTH TWICE A DAY AS NEEDED 180 tablet 1   rizatriptan  (MAXALT -MLT) 10 MG disintegrating tablet TAKE 1 TABLET BY MOUTH AS NEEDED FOR MIGRAINE. MAY REPEAT IN 2 HOURS IF NEEDEDSTRENGTH: 10 MG 9 tablet 1   sertraline (ZOLOFT) 50 MG tablet Take by mouth.     tacrolimus  (PROTOPIC ) 0.1 % ointment Apply 2 grams twice daily to affected areas of skin 100 g 5    topiramate  (TOPAMAX ) 50 MG tablet Take 1 tablet (50 mg total) by mouth at bedtime. 30 tablet 2   triamcinolone  ointment (KENALOG ) 0.1 % Apply 1 application topically 2 (two) times daily. 454 g 5   valsartan  (DIOVAN ) 40 MG tablet TAKE 1 TABLET BY MOUTH EVERY DAY 90 tablet 3   verapamil  (CALAN ) 80 MG tablet TAKE 1 TABLET BY MOUTH 3 (THREE) TIMES DAILY. 270 tablet 1   No current facility-administered medications for this visit.   Allergies: Allergies[1] I reviewed her past medical history, social history, family history, and environmental history and no significant changes have been reported from her previous visit.  Review of Systems  Constitutional:  Negative for appetite change, chills, fever and unexpected weight change.  HENT:  Positive for congestion. Negative for rhinorrhea.   Eyes:  Negative for itching.  Respiratory:  Negative for cough, chest tightness, shortness of breath and wheezing.   Cardiovascular:  Negative for chest pain.  Gastrointestinal:  Negative for abdominal pain.  Genitourinary:  Negative for difficulty urinating.  Skin:  Positive for rash.  Allergic/Immunologic: Positive for environmental allergies and food allergies.  Neurological:  Negative for headaches.    Objective: There were no vitals taken for this visit. There is no height or weight on file to calculate BMI. Physical Exam Vitals and nursing note reviewed.  Constitutional:      Appearance: Normal appearance. She is well-developed.  HENT:     Head: Normocephalic and atraumatic.     Right Ear: Tympanic membrane and external ear normal.     Left Ear: Tympanic membrane and external ear normal.     Nose: Congestion present.     Mouth/Throat:     Mouth: Mucous membranes are moist.     Pharynx: Oropharynx is clear.  Eyes:     Conjunctiva/sclera: Conjunctivae normal.  Cardiovascular:     Rate and Rhythm: Normal rate and regular rhythm.     Heart sounds: Normal heart sounds. No murmur heard.    No  friction rub. No gallop.  Pulmonary:     Effort: Pulmonary effort is normal.     Breath sounds: Normal breath sounds. No wheezing, rhonchi or rales.  Musculoskeletal:     Cervical back: Neck supple.  Skin:    General: Skin is warm.     Findings: No rash.  Neurological:     Mental Status: She is alert and oriented to person, place, and time.  Psychiatric:        Behavior: Behavior normal.    Previous notes and tests were reviewed. The plan was reviewed with the patient/family, and all questions/concerned were addressed.  It was my pleasure to see Carla Buck today and participate in her care. Please feel free to contact me with any questions or concerns.  Sincerely,  Orlan Cramp, DO Allergy & Immunology  [facility]  [facility] office: [phone] [facility] office: [phone]    [1]  Allergies Allergen Reactions  Bee Venom Anaphylaxis   Peanut-Containing Drug Products Anaphylaxis   Pollen Extract Shortness Of Breath    Tree and grass    Shellfish Allergy Anaphylaxis   Wasp Venom Anaphylaxis   Xolair [Omalizumab] Anaphylaxis   Amoxicillin Hives   Bug Itch Releaf [Misc Natural Products] Hives    Roaches, ants and dustmites   Contrave [Naltrexone-Bupropion Hcl Er] Hives   Dust Mite Extract     Asthma trigger   Iodine Hives   Lidocaine Hives   Orange Fruit [Citrus] Hives   Penicillins Hives   Sesame Oil Diarrhea    GI upset   Tetracaine Hives   Tomato Hives

## 2024-08-02 ENCOUNTER — Other Ambulatory Visit: Payer: Self-pay

## 2024-08-02 ENCOUNTER — Ambulatory Visit: Admitting: Allergy

## 2024-08-02 ENCOUNTER — Ambulatory Visit: Payer: Self-pay | Admitting: Allergy

## 2024-08-02 ENCOUNTER — Telehealth: Payer: Self-pay | Admitting: Allergy

## 2024-08-02 ENCOUNTER — Encounter: Payer: Self-pay | Admitting: Allergy

## 2024-08-02 VITALS — BP 126/74 | HR 82 | Temp 98.0°F | Resp 20 | Wt 241.4 lb

## 2024-08-02 DIAGNOSIS — Z91038 Other insect allergy status: Secondary | ICD-10-CM | POA: Diagnosis not present

## 2024-08-02 DIAGNOSIS — J3089 Other allergic rhinitis: Secondary | ICD-10-CM | POA: Diagnosis not present

## 2024-08-02 DIAGNOSIS — T7840XD Allergy, unspecified, subsequent encounter: Secondary | ICD-10-CM | POA: Diagnosis not present

## 2024-08-02 DIAGNOSIS — J4520 Mild intermittent asthma, uncomplicated: Secondary | ICD-10-CM

## 2024-08-02 DIAGNOSIS — Z889 Allergy status to unspecified drugs, medicaments and biological substances status: Secondary | ICD-10-CM

## 2024-08-02 DIAGNOSIS — J302 Other seasonal allergic rhinitis: Secondary | ICD-10-CM

## 2024-08-02 DIAGNOSIS — L2089 Other atopic dermatitis: Secondary | ICD-10-CM

## 2024-08-02 DIAGNOSIS — T7800XD Anaphylactic reaction due to unspecified food, subsequent encounter: Secondary | ICD-10-CM

## 2024-08-02 NOTE — Telephone Encounter (Signed)
 Please refer to rheumatology for ANA positive 1:640 speckled pattern. Significant family history for lupus.   Thank you.

## 2024-08-02 NOTE — Telephone Encounter (Signed)
 Patient made an appointment for her New Start Venom Injections on September 04, 2024 at 2pm. She decided to do the injections and also called her insurance and they stated it is covered thru her insurance.

## 2024-08-02 NOTE — Patient Instructions (Addendum)
 Allergic reaction Unclear cause.  Continue to avoid beef only. Okay to reintroduce pork. If you notice any issues let me know.  Continue to avoid peanuts, tree nuts, shellfish, tomatoes, orange.  Okay to eat finned fish, coconut, soy, sesame, wheat..   Discussed that her food triggered oral and throat symptoms are likely caused by oral food allergy syndrome (OFAS). This is caused by cross reactivity of pollen with fresh fruits and vegetables, and nuts. Symptoms are usually localized in the form of itching and burning in mouth and throat. Very rarely it can progress to more severe symptoms. Eating foods in cooked or processed forms usually minimizes symptoms. I recommended avoidance of eating the problem foods, especially during the peak season(s). Sometimes, OFAS can induce severe throat swelling or even a systemic reaction; with such instance, I advised them to report to a local ER. A list of common pollens and food cross-reactivities was provided to the patient.   For mild symptoms you can take over the counter antihistamines (zyrtec  10mg  to 20mg ) and monitor symptoms closely.  If symptoms worsen or if you have severe symptoms including breathing issues, throat closure, significant swelling, whole body hives, severe diarrhea and vomiting, lightheadedness then use epinephrine  and seek immediate medical care afterwards. Emergency action plan in place.   Continue allegra (fexofenadine) 180mg  in the morning. Continue Xyzal  5mg  in the evening.  If symptoms are not controlled or causes drowsiness let us  know. Continue Pepcid  (famotidine ) 20mg  twice a day.  Avoid the following potential triggers: alcohol, tight clothing, NSAIDs, hot showers and getting overheated.   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 Borderline to yellow jacket and wasp, fire ant. Continue to avoid.  Make sure you carry your epinephrine  device when outdoors. Start venom immunotherapy - 3  injections.  (Mixed vespid, wasp and fire ant) There are two phases. During the build up phase you will come in once a week for 29 weeks, then every 2 weeks for one time then every 3 weeks for one time. Then you will reach maintenance phase which is every 4 weeks for 2 years. Then every 6 weeks for 2 years and then every 8 weeks until we decide to stop. I usually see patients on hymenoptera immunotherapy at least once a year as a follow up. Consent was signed.  Asthma May use albuterol  rescue inhaler 2 puffs very 4 to 6 hours as needed for shortness of breath, chest tightness, coughing, and wheezing.  Monitor frequency of use - if you need to use it more than twice per week on a consistent basis let us  know.   Environmental allergies 2025 labs positive to dust mites, grass, trees, weed, ragweed, cockroaches. Borderline to cat, dog.  See below for environmental control measures.  Take antihistamines as above. Use Flonase (fluticasone) nasal spray 1-2 sprays per nostril once a day as needed for nasal congestion.  Nasal saline spray (i.e., Simply Saline) or nasal saline lavage (i.e., NeilMed) is recommended as needed and prior to medicated nasal sprays. Will consider starting environmental allergy shots once you are on maintenance dose for the stinging insect injections.   Skin  Keep track of rashes and take pictures. Continue proper skin care Keep follow up with dermatology.   Rheumatology  Refer to rheumatology for elevated ANA.   Hold phentermine .   Follow up  in 3 months or sooner if needed.  Make first venom shot at your earliest convenience.   Reducing Pollen Exposure Pollen seasons: trees (  spring), grass (summer) and ragweed/weeds (fall). Keep windows closed in your home and car to lower pollen exposure.  Install air conditioning in the bedroom and throughout the house if possible.  Avoid going out in dry windy days - especially early morning. Pollen counts are highest between  5 - 10 AM and on dry, hot and windy days.  Save outside activities for late afternoon or after a heavy rain, when pollen levels are lower.  Avoid mowing of grass if you have grass pollen allergy. Be aware that pollen can also be transported indoors on people and pets.  Dry your clothes in an automatic dryer rather than hanging them outside where they might collect pollen.  Rinse hair and eyes before bedtime. Control of House Dust Mite Allergen Dust mite allergens are a common trigger of allergy and asthma symptoms. While they can be found throughout the house, these microscopic creatures thrive in warm, humid environments such as bedding, upholstered furniture and carpeting. Because so much time is spent in the bedroom, it is essential to reduce mite levels there.  Encase pillows, mattresses, and box springs in special allergen-proof fabric covers or airtight, zippered plastic covers.  Bedding should be washed weekly in hot water (130 F) and dried in a hot dryer. Allergen-proof covers are available for comforters and pillows that cant be regularly washed.  Wash the allergy-proof covers every few months. Minimize clutter in the bedroom. Keep pets out of the bedroom.  Keep humidity less than 50% by using a dehumidifier or air conditioning. You can buy a humidity measuring device called a hygrometer to monitor this.  If possible, replace carpets with hardwood, linoleum, or washable area rugs. If that's not possible, vacuum frequently with a vacuum that has a HEPA filter. Remove all upholstered furniture and non-washable window drapes from the bedroom. Remove all non-washable stuffed toys from the bedroom.  Wash stuffed toys weekly. Pet Allergen Avoidance: Contrary to popular opinion, there are no hypoallergenic breeds of dogs or cats. That is because people are not allergic to an animals hair, but to an allergen found in the animal's saliva, dander (dead skin flakes) or urine. Pet allergy symptoms  typically occur within minutes. For some people, symptoms can build up and become most severe 8 to 12 hours after contact with the animal. People with severe allergies can experience reactions in public places if dander has been transported on the pet owners clothing. Keeping an animal outdoors is only a partial solution, since homes with pets in the yard still have higher concentrations of animal allergens. Before getting a pet, ask your allergist to determine if you are allergic to animals. If your pet is already considered part of your family, try to minimize contact and keep the pet out of the bedroom and other rooms where you spend a great deal of time. As with dust mites, vacuum carpets often or replace carpet with a hardwood floor, tile or linoleum. High-efficiency particulate air (HEPA) cleaners can reduce allergen levels over time. While dander and saliva are the source of cat and dog allergens, urine is the source of allergens from rabbits, hamsters, mice and guinea pigs; so ask a non-allergic family member to clean the animals cage. If you have a pet allergy, talk to your allergist about the potential for allergy immunotherapy (allergy shots). This strategy can often provide long-term relief. Cockroach Allergen Avoidance Cockroaches are often found in the homes of densely populated urban areas, schools or commercial buildings, but these creatures can lurk  almost anywhere. This does not mean that you have a dirty house or living area. Block all areas where roaches can enter the home. This includes crevices, wall cracks and windows.  Cockroaches need water to survive, so fix and seal all leaky faucets and pipes. Have an exterminator go through the house when your family and pets are gone to eliminate any remaining roaches. Keep food in lidded containers and put pet food dishes away after your pets are done eating. Vacuum and sweep the floor after meals, and take out garbage and recyclables. Use  lidded garbage containers in the kitchen. Wash dishes immediately after use and clean under stoves, refrigerators or toasters where crumbs can accumulate. Wipe off the stove and other kitchen surfaces and cupboards regularly.
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
## 2024-08-08 ENCOUNTER — Encounter: Payer: Self-pay | Admitting: Physical Therapy

## 2024-08-08 ENCOUNTER — Other Ambulatory Visit: Payer: Self-pay

## 2024-08-08 ENCOUNTER — Ambulatory Visit: Admitting: Physical Therapy

## 2024-08-08 DIAGNOSIS — M79601 Pain in right arm: Secondary | ICD-10-CM | POA: Insufficient documentation

## 2024-08-08 DIAGNOSIS — M6281 Muscle weakness (generalized): Secondary | ICD-10-CM | POA: Diagnosis present

## 2024-08-08 DIAGNOSIS — R202 Paresthesia of skin: Secondary | ICD-10-CM | POA: Insufficient documentation

## 2024-08-13 ENCOUNTER — Ambulatory Visit: Admitting: Allergy

## 2024-08-13 NOTE — Telephone Encounter (Signed)
 Carla Buck is scheduled with Rheumatology for 08/31/24 at 8:00 am with Dr. Mauricio

## 2024-08-16 ENCOUNTER — Other Ambulatory Visit: Payer: Self-pay | Admitting: Internal Medicine

## 2024-08-16 DIAGNOSIS — G43909 Migraine, unspecified, not intractable, without status migrainosus: Secondary | ICD-10-CM

## 2024-08-17 ENCOUNTER — Encounter: Payer: Self-pay | Admitting: Internal Medicine

## 2024-08-17 DIAGNOSIS — R232 Flushing: Secondary | ICD-10-CM

## 2024-08-19 ENCOUNTER — Other Ambulatory Visit: Payer: Self-pay | Admitting: Internal Medicine

## 2024-08-20 DIAGNOSIS — T63421D Toxic effect of venom of ants, accidental (unintentional), subsequent encounter: Secondary | ICD-10-CM | POA: Diagnosis not present

## 2024-08-20 NOTE — Progress Notes (Signed)
 VIAL MADE ON 08/20/24

## 2024-08-20 NOTE — Progress Notes (Signed)
 Aeroallergen Immunotherapy  Ordering Provider: Orlan Cramp, DO  Patient Details Name: ALASHIA BROWNFIELD MRN: [id number] Date of Birth: [birth year]  Order 1 of 1  Vial Label: Fire Ant  0.5 ml (Volume)  1:20 Concentration -- Fire Ant   0.5  ml Extract Subtotal 4.5  ml Diluent  5.0  ml Maintenance Total  Schedule:  B Green Vial (1:1,000): Schedule B (6 doses) Blue Vial (1:100): Schedule B (6 doses) Yellow Vial (1:10): Schedule B (6 doses) Red Vial (1:1): Schedule A (14 doses)  Special Instructions: May come in 1-2 times a week during build up as tolerated. Once a week on red vial. Once on red vial #1 0.5cc go every 2 weeks, on red vial #2 0.5cc go every 4 weeks. May build up red vials faster (0.1, 0.3, 0.5). PATIENT WILL ALSO START MIXED VESPID AND WASP INJECTIONS WITH THE FIRE ANT.

## 2024-08-24 ENCOUNTER — Ambulatory Visit: Attending: Neurology

## 2024-08-24 ENCOUNTER — Encounter: Payer: Self-pay | Admitting: Allergy

## 2024-08-24 DIAGNOSIS — M79601 Pain in right arm: Secondary | ICD-10-CM | POA: Diagnosis present

## 2024-08-24 DIAGNOSIS — M6281 Muscle weakness (generalized): Secondary | ICD-10-CM | POA: Insufficient documentation

## 2024-08-24 DIAGNOSIS — M25532 Pain in left wrist: Secondary | ICD-10-CM | POA: Diagnosis present

## 2024-08-24 MED ORDER — LEVOCETIRIZINE DIHYDROCHLORIDE 5 MG PO TABS: 5.0000 mg | ORAL_TABLET | Freq: Two times a day (BID) | ORAL | 5 refills | Status: AC
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
## 2024-08-24 NOTE — Telephone Encounter (Signed)
 Please see the MyChart message reply(ies) for my assessment and plan.    This patient gave consent for this Medical Advice Message and is aware that it may result in a bill to [employer], as well as the possibility of receiving a bill for a co-payment or deductible. They are an established patient, but are not seeking medical advice exclusively about a problem treated during an in person or video visit in the last seven days. I did not recommend an in person or video visit within seven days of my reply.    I spent a total of 10 minutes cumulative time within 7 days through [employer].  Jonah Blue, MD

## 2024-08-28 ENCOUNTER — Ambulatory Visit: Attending: Family Medicine

## 2024-08-28 DIAGNOSIS — R232 Flushing: Secondary | ICD-10-CM

## 2024-08-29 ENCOUNTER — Other Ambulatory Visit: Payer: Self-pay | Admitting: Internal Medicine

## 2024-08-29 ENCOUNTER — Ambulatory Visit: Payer: Self-pay | Admitting: Internal Medicine

## 2024-08-29 DIAGNOSIS — Z9109 Other allergy status, other than to drugs and biological substances: Secondary | ICD-10-CM

## 2024-08-29 DIAGNOSIS — I10 Essential (primary) hypertension: Secondary | ICD-10-CM

## 2024-08-29 LAB — FSH/LH
FSH: 7.4 m[IU]/mL
LH: 15.8 m[IU]/mL

## 2024-08-29 LAB — ESTRADIOL: Estradiol: 91.5 pg/mL

## 2024-08-30 ENCOUNTER — Other Ambulatory Visit: Payer: Self-pay | Admitting: Internal Medicine

## 2024-08-30 DIAGNOSIS — G43909 Migraine, unspecified, not intractable, without status migrainosus: Secondary | ICD-10-CM

## 2024-08-30 NOTE — Progress Notes (Signed)
 "   Consult Note  Patient: Carla Buck             Date of Birth: Nov 26, 1980           MRN: [id number]             Visit Date: 08/31/2024  Referring Provider: Vicci Barnie NOVAK, MD  Thank you for entrusting me in the care of this patient! Below are my findings:  Subjective:   Chief Complaint: New Patient (Initial Visit) (Patient states she has pain in her fingers and she has a cyst that comes and goes on her left pointer finger. Patient states she has some knee pain. Patient states she has rashes that come and go and some discoloration on her face. )  History of Present Illness: Carla Buck is a 44 y.o. female with PMH of severe food allergies, asthma, and atopic dermatitis who presents to the clinic today for evaluation of abnormal labs, including ANA of 1:640 speckled, which was checked by her allergist as she was being evaluated after an episode of anaphylaxis. She endorses a positive family history of lupus.   She endorses significant fatigue. This has been going on for years. She notes that she feels extremely tired around noon each day and feels like she could take a nap. 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 She has a history of atopic dermatitis since she was a child. Recently, she started to have thick cracked skin and redness on her cheeks. She saw dermatology and was prescribed tacrolimus , which has been helping. She still has significant cracking on her feet.   She has had photosensitivity since she was a child. She uses sunscreen regularly. She occasionally gets inflamed papules on the tip of her tongues but denies any ulcerations in her mouth or nose. She has dry mouth but attributes this to some of her medications. She occasionally has dry eyes. This used to be more present years ago but is not currently an issue. She endorses joint pain that comes and goes and is better some days than others. Denies morning stiffness. She does have  a personal history of blood clots. She had an superficial clot in her right cephalic vein after extravasation of an epinephrine  IV placed during an anyphylactic reaction and was treated with 4 weeks of aspirin. She is at high risk for breast cancer and has had two lumpectomies.   She denies fevers, hair loss or thinning, Raynaud's phenomenon, pleuritic chest pain, shortness of breath, muscle weakness, or dysphagia.   Labs From Referral: ANA positive 1:640 speckled pattern, C3/C4 WNL, CBC/CMP WNL  Review of Systems: Review of Systems  Constitutional:  Positive for fatigue.  HENT:  Positive for mouth sores and mouth dryness.   Eyes:  Negative for dryness.  Respiratory:  Negative for shortness of breath.   Cardiovascular:  Negative for chest pain and palpitations.  Gastrointestinal:  Negative for blood in stool, constipation and diarrhea.  Endocrine: Positive for increased urination.  Genitourinary:  Negative for involuntary urination.  Musculoskeletal:  Positive for joint pain, joint pain and joint swelling. Negative for gait problem, myalgias, muscle weakness, morning stiffness, muscle tenderness and myalgias.  Skin:  Positive for color change, rash and sensitivity to sunlight. Negative for hair loss.  Allergic/Immunologic: Negative for susceptible to infections.  Neurological:  Positive for headaches. Negative for dizziness.  Hematological:  Negative for swollen glands.  Psychiatric/Behavioral:  Positive for depressed mood and sleep disturbance. The patient  is nervous/anxious.    Medication List: Current Medications[1]   Allergies:  Bee venom, Iodine, Peanut-containing drug products, Penicillins, Pollen extract, Shellfish allergy, Wasp venom, Xolair [omalizumab], Lidocaine, Naltrexone-bupropion hcl er, Tetracaine, Amoxicillin, Bovine (beef) protein-containing drug products, Bug itch releaf [misc natural products], Dust mite extract, Orange fruit [citrus], Sesame oil, Tomato, and Gluten  meal  Immunization status:  Immunization History  Administered Date(s) Administered   DTaP 09/07/1982, 02/15/1983, 05/27/1983, 07/19/1984, 07/09/1987   Hepatitis A, Ped/Adol-2 Dose 01/15/1999   Hepatitis B, PED/ADOLESCENT 05/19/1994, 06/23/1994, 12/01/1994   Influenza,inj,Quad PF,6+ Mos 05/02/2019, 05/27/2022   Influenza-Unspecified 05/17/2023, 04/29/2024   MMR 10/18/1983, 04/13/2000   Meningococcal Conjugate 05/16/2001   Moderna SARS-COV2 Booster Vaccination 05/28/2022   OPV 09/07/1982, 02/15/1983, 05/27/1983, 07/09/1987   PFIZER(Purple Top)SARS-COV-2 Vaccination 09/09/2019, 10/07/2019   PNEUMOCOCCAL CONJUGATE-20 04/03/2021   Pneumococcal Polysaccharide-23 07/19/1984, 11/09/2016   Td 10/02/1998   Tdap 03/07/2019   Unspecified SARS-COV-2 Vaccination 05/17/2023    Problem List:  Patient Active Problem List   Diagnosis Date Noted   At high risk for breast cancer 10/20/2022   Urticaria 03/16/2021   Bilateral sensorineural hearing loss 08/06/2020   History of recurrent UTIs 10/19/2019   Food allergy 07/30/2019   Chronic migraine without aura without status migrainosus, not intractable 07/02/2019   Chronic pain of right knee 06/21/2019   Moderate persistent asthma 06/19/2019   Perennial and seasonal allergic rhinitis 06/19/2019   Ganglion cyst of finger 06/19/2019   Other atopic dermatitis 06/19/2019   Obesity 06/19/2019   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   History of lumpectomy of both breasts 06/19/2019   Generalized anxiety disorder 04/25/2018   Atypical ductal hyperplasia of breast 02/28/2017   History: Past Medical History:  Diagnosis Date   Allergy    Anxiety    Asthma    Depression    Eczema    Ganglion cyst of dorsum of left wrist 10/27/2022   Hearing impairment    Wears hearing aids   Hypertension    Migraine    Urticaria     Family History  Problem Relation Age of Onset   Hypertension Mother    Allergic rhinitis Mother    Asthma Mother     Eczema Mother    Depression Father    Asthma Father    Bipolar disorder Sister    Asthma Sister    Hypertension Maternal Grandmother    Hypertension Paternal Grandmother    Diabetes Paternal Grandmother    Hypertension Paternal Grandfather    Migraines Neg Hx    Past Surgical History:  Procedure Laterality Date   ABDOMINAL HYSTERECTOMY  11/2015   fibroids   BREAST LUMPECTOMY     BREAST LUMPECTOMY Right 11/14/2019   GASTRIC BYPASS     left wrist surgegy   11/2022   OOPHORECTOMY Left 09/2018   torsion   Social History   Social History Narrative   Lives at home with her children    Right handed   Caffeine: 2-3 cups/day      Are you right handed or left handed? Right handed    Are you currently employed ? 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 No    Who lives with you? Husband    What type of home do you live in: 1 story or 2 story? Lives in a one story home       Objective: Vital Signs: BP (!) 132/90 (BP Location: Left Arm,  Patient Position: Sitting, Cuff Size: Large)   Pulse 69   Temp 98.3 F (36.8 C)   Resp 14   Ht 5' 5.25 (1.657 m)   Wt 246 lb 12.8 oz (111.9 kg)   BMI 40.76 kg/m   Physical Exam Vitals reviewed.  Constitutional:      General: She is not in acute distress.    Appearance: Normal appearance. She is well-developed and normal weight.  HENT:     Head: Normocephalic and atraumatic. No right periorbital erythema or left periorbital erythema.     Mouth/Throat:     Mouth: Mucous membranes are moist.     Pharynx: Oropharynx is clear.  Eyes:     General: Lids are normal.        Right eye: No discharge.        Left eye: No discharge.     Extraocular Movements: Extraocular movements intact.     Conjunctiva/sclera: Conjunctivae normal.     Pupils: Pupils are equal, round, and reactive to light.  Cardiovascular:     Rate and Rhythm: Normal rate and regular rhythm.     Heart sounds: Normal heart  sounds. No murmur heard.    No friction rub. No gallop.  Pulmonary:     Effort: Pulmonary effort is normal. No respiratory distress.     Breath sounds: Normal breath sounds. No stridor. No wheezing, rhonchi or rales.  Chest:     Chest wall: No deformity.  Musculoskeletal:     Right lower leg: No edema.     Left lower leg: No edema.     Comments: No synovitis present on exam today. Tenderness noted in right knee. C-spine, thoracic spine, lumbar spine have good range of motion. No SI joint tenderness. Shoulder joints, elbow joints, wrist joints, MCPs, PIPs, DIPs have good range of motion. Complete fist formation bilaterally. Ganglion cyst present on flexor surface of 2nd finger. Hip joints have good range of motion with no pain with internal and external rotation.  Knee joints have good range of motion with no warmth or effusion. Pain and crepitus noted during extension of right knee. Ankle joints have good range of motion with no tenderness or joint swelling. No evidence of Achilles tendinitis. Negative MTP squeeze.   Lymphadenopathy:     Cervical: No cervical adenopathy.  Skin:    General: Skin is warm and moist.     Capillary Refill: Capillary refill takes less than 2 seconds.     Findings: No rash.  Neurological:     Mental Status: She is alert.     Gait: Gait normal.  Psychiatric:        Mood and Affect: Mood normal.        Behavior: Behavior normal.     Imaging: No results found. Assessment & Plan Positive ANA (antinuclear antibody) ANA positive 1:640 speckled pattern. She has a wide array of symptoms consistent with connective tissue disease that have been present for a long time, including photosensitivity, rashes, fatigue. No significant joint pain or synovitis present today, other than her chronic right knee pain. Will check markers for Rheumatoid Arthritis given positive ANA but clinical suspicion is low. Pending serology, I would have a low threshold to start her on  hydroxychloroquine or CellCept.  Orders:   Anti-Smith antibody   RNP Antibody   Sjogrens syndrome-A extractable nuclear antibody   Sjogrens syndrome-B extractable nuclear antibody   Anti-DNA antibody, double-stranded   C3 and C4   Rheumatoid factor   Sedimentation rate  Cyclic citrul peptide antibody, IgG   C-reactive protein   VITAMIN D  25 Hydroxy (Vit-D Deficiency, Fractures)  Chronic fatigue Several years of unrelenting fatigue. Last hemoglobin was 11.8. 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 Will evaluate for additional causes of fatigue today.  Orders:   TSH   CBC with Differential/Platelet   COMPLETE METABOLIC PANEL WITHOUT GFR   Iron, TIBC and Ferritin Panel   VITAMIN D  25 Hydroxy (Vit-D Deficiency, Fractures)  Ganglion cyst of finger Present on 2nd finger flexor surface. Comes and goes per patient report but seems to not be bothersome. Will continue to monitor.  Chronic pain of right knee Knee was tender on exam today and pain with range of motion. Previous imaging negative for degenerative changes. Oblique orientation of the patella at the trochlea of the femur on the sunrise view, can be seen with patellofemoral syndrome. Consider looking with ultrasound at next visit to see if she would be a good candidate for a steroid injection.   Follow-Up Instructions:  Return in about 4 weeks (around 09/28/2024) for NPT FU.   I personally spent a total of 65 minutes in the care of the patient today including preparing to see the patient, getting/reviewing separately obtained history, performing a medically appropriate exam/evaluation, counseling and educating, placing orders, documenting clinical information in the EHR, and coordinating care.   Procedures: No procedures performed  Daved GORMAN Holstein, PA-C  Note - This record has been created using [employer]. Chart creation errors have been sought, but may not always have been located. Such creation  errors do not reflect on the standard of medical care.       [1]  Current Outpatient Medications:    acyclovir  (ZOVIRAX ) 400 MG tablet, TAKE 1 TABLET BY MOUTH TWICE A DAY, Disp: 180 tablet, Rfl: 0   albuterol  (VENTOLIN  HFA) 108 (90 Base) MCG/ACT inhaler, Inhale 2 puffs every 4-6 hours as needed for cough, wheeze, tightness in chest, shortness of breath, Disp: 18 g, Rfl: 3   busPIRone (BUSPAR) 10 MG tablet, Take 10 mg by mouth 3 (three) times daily., Disp: , Rfl:    clobetasol  ointment (TEMOVATE ) 0.05 %, Apply 1 gram topically to affected area of skin twice daily. Stop once resolved and restart as needed for flares. Avoid use on face, armpits, groin unless otherwise indicated., Disp: 60 g, Rfl: 5   diphenhydrAMINE  (BENADRYL ) 25 mg capsule, Take 25 mg by mouth every 6 (six) hours as needed., Disp: , Rfl:    EPINEPHrine  0.3 mg/0.3 mL IJ SOAJ injection, INJECT 0.3 MG INTO THE MUSCLE AS NEEDED FOR ANAPHYLAXIS., Disp: 2 each, Rfl: 2   famotidine  (PEPCID ) 20 MG tablet, Take 1 tablet (20 mg total) by mouth 2 (two) times daily., Disp: 60 tablet, Rfl: 2   ferrous sulfate 325 (65 FE) MG tablet, Take 325 mg by mouth daily with breakfast., Disp: , Rfl:    gabapentin  (NEURONTIN ) 300 MG capsule, Take 1 capsule (300 mg total) by mouth at bedtime., Disp: 30 capsule, Rfl: 3   hydrochlorothiazide  (HYDRODIURIL ) 25 MG tablet, TAKE 1 TABLET (25 MG TOTAL) BY MOUTH DAILY., Disp: 90 tablet, Rfl: 1   levocetirizine (XYZAL ) 5 MG tablet, Take 1 tablet (5 mg total) by mouth 2 (two) times daily., Disp: 30 tablet, Rfl: 5   montelukast  (SINGULAIR ) 10 MG tablet, TAKE 1 TABLET BY MOUTH EVERYDAY AT BEDTIME, Disp: 90 tablet, Rfl: 1   Multiple Vitamins-Minerals (BARIATRIC MULTIVITAMINS/IRON PO), Take 1 tablet by mouth., Disp: ,  Rfl:    nitrofurantoin (MACRODANTIN) 50 MG capsule, Take 50 mg by mouth daily as needed., Disp: , Rfl:    NON FORMULARY, ESTROGENS CONJUGATED 0.625 MG/GM, Disp: , Rfl:    ondansetron  (ZOFRAN -ODT) 4 MG  disintegrating tablet, TAKE 1-2 TABLETS (4-8 MG TOTAL) BY MOUTH EVERY 8 (EIGHT) HOURS AS NEEDED FOR NAUSEA. APPOINTMENT NEEDED FOR FURTHER REFILLS. *INS LIMIT, Disp: 18 tablet, Rfl: 1   Plecanatide  (TRULANCE ) 3 MG TABS, Take 1 tablet (3 mg total) by mouth daily., Disp: 30 tablet, Rfl: 0   propranolol (INDERAL) 10 MG tablet, TAKE 1 TABLET BY MOUTH TWICE A DAY AS NEEDED, Disp: 180 tablet, Rfl: 1   rizatriptan  (MAXALT -MLT) 10 MG disintegrating tablet, TAKE 1 TABLET BY MOUTH AS NEEDED FOR MIGRAINE. MAY REPEAT IN 2 HOURS IF NEEDEDSTRENGTH: 10 MG, Disp: 9 tablet, Rfl: 4   sertraline (ZOLOFT) 50 MG tablet, Take by mouth., Disp: , Rfl:    tacrolimus  (PROTOPIC ) 0.1 % ointment, Apply 2 grams twice daily to affected areas of skin, Disp: 100 g, Rfl: 5   triamcinolone  ointment (KENALOG ) 0.1 %, Apply 1 application topically 2 (two) times daily., Disp: 454 g, Rfl: 5   valsartan  (DIOVAN ) 40 MG tablet, TAKE 1 TABLET BY MOUTH EVERY DAY, Disp: 90 tablet, Rfl: 3   verapamil  (CALAN ) 80 MG tablet, TAKE 1 TABLET BY MOUTH 3 (THREE) TIMES DAILY., Disp: 270 tablet, Rfl: 1  "

## 2024-08-31 ENCOUNTER — Ambulatory Visit

## 2024-08-31 VITALS — BP 132/90 | HR 69 | Temp 98.3°F | Resp 14 | Ht 65.25 in | Wt 246.8 lb

## 2024-08-31 DIAGNOSIS — R7689 Other specified abnormal immunological findings in serum: Secondary | ICD-10-CM

## 2024-08-31 DIAGNOSIS — M67449 Ganglion, unspecified hand: Secondary | ICD-10-CM

## 2024-08-31 DIAGNOSIS — G8929 Other chronic pain: Secondary | ICD-10-CM

## 2024-08-31 DIAGNOSIS — M25561 Pain in right knee: Secondary | ICD-10-CM

## 2024-08-31 DIAGNOSIS — R5382 Chronic fatigue, unspecified: Secondary | ICD-10-CM | POA: Diagnosis not present

## 2024-08-31 NOTE — Assessment & Plan Note (Signed)
 Knee was tender on exam today and pain with range of motion. Previous imaging negative for degenerative changes. Oblique orientation of the patella at the trochlea of the femur on the sunrise view, can be seen with patellofemoral syndrome. Consider looking with ultrasound at next visit to see if she would be a good candidate for a steroid injection.

## 2024-08-31 NOTE — Patient Instructions (Signed)
 [email or web address]

## 2024-08-31 NOTE — Assessment & Plan Note (Signed)
 Present on 2nd finger flexor surface. Comes and goes per patient report but seems to not be bothersome. Will continue to monitor.

## 2024-09-03 ENCOUNTER — Ambulatory Visit: Payer: Self-pay

## 2024-09-03 ENCOUNTER — Ambulatory Visit

## 2024-09-03 LAB — CBC WITH DIFFERENTIAL/PLATELET
Absolute Lymphocytes: 1868 {cells}/uL (ref 850–3900)
Absolute Monocytes: 391 {cells}/uL (ref 200–950)
Basophils Absolute: 32 {cells}/uL (ref 0–200)
Basophils Relative: 0.7 %
Eosinophils Absolute: 129 {cells}/uL (ref 15–500)
Eosinophils Relative: 2.8 %
HCT: 40.2 % (ref 35.9–46.0)
Hemoglobin: 12.5 g/dL (ref 11.7–15.5)
MCH: 28.8 pg (ref 27.0–33.0)
MCHC: 31.1 g/dL — ABNORMAL LOW (ref 31.6–35.4)
MCV: 92.6 fL (ref 81.4–101.7)
MPV: 10.3 fL (ref 7.5–12.5)
Monocytes Relative: 8.5 %
Neutro Abs: 2180 {cells}/uL (ref 1500–7800)
Neutrophils Relative %: 47.4 %
Platelets: 323 Thousand/uL (ref 140–400)
RBC: 4.34 Million/uL (ref 3.80–5.10)
RDW: 14 % (ref 11.0–15.0)
Total Lymphocyte: 40.6 %
WBC: 4.6 Thousand/uL (ref 3.8–10.8)

## 2024-09-03 LAB — COMPLETE METABOLIC PANEL WITHOUT GFR
AG Ratio: 1.6 (calc) (ref 1.0–2.5)
ALT: 14 U/L (ref 6–29)
AST: 21 U/L (ref 10–30)
Albumin: 4.2 g/dL (ref 3.6–5.1)
Alkaline phosphatase (APISO): 64 U/L (ref 31–125)
BUN: 12 mg/dL (ref 7–25)
CO2: 26 mmol/L (ref 20–32)
Calcium: 9 mg/dL (ref 8.6–10.2)
Chloride: 100 mmol/L (ref 98–110)
Creat: 0.69 mg/dL (ref 0.50–0.99)
Globulin: 2.7 g/dL (ref 1.9–3.7)
Glucose, Bld: 70 mg/dL (ref 65–99)
Potassium: 4 mmol/L (ref 3.5–5.3)
Sodium: 136 mmol/L (ref 135–146)
Total Bilirubin: 0.2 mg/dL (ref 0.2–1.2)
Total Protein: 6.9 g/dL (ref 6.1–8.1)

## 2024-09-03 LAB — C3 AND C4
C3 Complement: 129 mg/dL (ref 83–193)
C4 Complement: 38 mg/dL (ref 15–57)

## 2024-09-03 LAB — RHEUMATOID FACTOR: Rheumatoid fact SerPl-aCnc: <10 [IU]/mL

## 2024-09-03 LAB — CYCLIC CITRUL PEPTIDE ANTIBODY, IGG: Cyclic Citrullin Peptide Ab: <16 U

## 2024-09-03 LAB — VITAMIN D 25 HYDROXY (VIT D DEFICIENCY, FRACTURES): Vit D, 25-Hydroxy: 27 ng/mL — ABNORMAL LOW (ref 30–100)

## 2024-09-03 LAB — IRON,TIBC AND FERRITIN PANEL
%SAT: 7 % — ABNORMAL LOW (ref 16–45)
Ferritin: 12 ng/mL — ABNORMAL LOW (ref 16–232)
Iron: 30 ug/dL — ABNORMAL LOW (ref 40–190)
TIBC: 435 ug/dL (ref 250–450)

## 2024-09-03 LAB — C-REACTIVE PROTEIN: CRP: 4.3 mg/L

## 2024-09-03 LAB — SJOGRENS SYNDROME-A EXTRACTABLE NUCLEAR ANTIBODY: SSA (Ro) (ENA) Antibody, IgG: <1 AI

## 2024-09-03 LAB — TSH: TSH: 2.07 m[IU]/L

## 2024-09-03 LAB — RNP ANTIBODY: Ribonucleic Protein(ENA) Antibody, IgG: <1 AI

## 2024-09-03 LAB — ANTI-SMITH ANTIBODY: ENA SM Ab Ser-aCnc: <1 AI

## 2024-09-03 LAB — SEDIMENTATION RATE: Sed Rate: 6 mm/h (ref 0–20)

## 2024-09-03 LAB — ANTI-DNA ANTIBODY, DOUBLE-STRANDED: ds DNA Ab: <1 [IU]/mL

## 2024-09-03 LAB — SJOGRENS SYNDROME-B EXTRACTABLE NUCLEAR ANTIBODY: SSB (La) (ENA) Antibody, IgG: <1 AI

## 2024-09-04 ENCOUNTER — Ambulatory Visit

## 2024-09-05 ENCOUNTER — Ambulatory Visit

## 2024-09-10 NOTE — Therapy (Incomplete)
 " OUTPATIENT PHYSICAL THERAPY UPPER EXTREMITY TREATMENT   Patient Name: Carla Buck MRN: [id number] DOB:[birth year], 44 y.o., female Today's Date: 09/10/2024  END OF SESSION:    Past Medical History:  Diagnosis Date   Allergy    Anxiety    Asthma    Depression    Eczema    Ganglion cyst of dorsum of left wrist 10/27/2022   Hearing impairment    Wears hearing aids   Hypertension    Migraine    Urticaria    Past Surgical History:  Procedure Laterality Date   ABDOMINAL HYSTERECTOMY  11/2015   fibroids   BREAST LUMPECTOMY     BREAST LUMPECTOMY Right 11/14/2019   GASTRIC BYPASS     left wrist surgegy   11/2022   OOPHORECTOMY Left 09/2018   torsion   Patient Active Problem List   Diagnosis Date Noted   At high risk for breast cancer 10/20/2022   Urticaria 03/16/2021   Bilateral sensorineural hearing loss 08/06/2020   History of recurrent UTIs 10/19/2019   Food allergy 07/30/2019   Chronic migraine without aura without status migrainosus, not intractable 07/02/2019   Chronic pain of right knee 06/21/2019   Moderate persistent asthma 06/19/2019   Perennial and seasonal allergic rhinitis 06/19/2019   Ganglion cyst of finger 06/19/2019   Other atopic dermatitis 06/19/2019   Obesity 06/19/2019   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   History of lumpectomy of both breasts 06/19/2019   Generalized anxiety disorder 04/25/2018   Atypical ductal hyperplasia of breast 02/28/2017    PCP: Vicci Barnie NOVAK, MD   REFERRING PROVIDER: Tobie Tonita POUR, DO  REFERRING DIAG: R20.2 (ICD-10-CM) - Arm paresthesia, right M79.601 (ICD-10-CM) - Right arm pain  THERAPY DIAG:  No diagnosis found.  Rationale for Evaluation and Treatment: Rehabilitation  ONSET DATE: 07/14/24  SUBJECTIVE:                                                                                                                                                                                       SUBJECTIVE STATEMENT: Doing ok, repetitive movements (typing, gripping steering wheel) painful and limited    Hand dominance: Right  PERTINENT HISTORY: Anxiety, asthma, depression, HTN, migraine, breast lumpectomy   PAIN:  Are you having pain? Yes: NPRS scale: 6/10 Pain location: R anterior humerus, sometimes in hand, sometimes in shoulder Pain description: tingling, sharp Aggravating factors: typing, lifting pot to drain noodles Relieving factors: heating pad, tylenol   PRECAUTIONS: None  RED FLAGS: None   WEIGHT BEARING RESTRICTIONS: Yes 1 while roller skating   FALLS:  Has patient fallen in last 6 months? No  LIVING ENVIRONMENT: Lives  with: lives with their spouse and kids Lives in: House/apartment  OCCUPATION: Working full time form home  PLOF: Independent, Vocation/Vocational requirements: WFH, and Leisure: painting, crafts, working out at [employer]- riding bike, treadmill, machines  PATIENT GOALS: improve function of R UE  NEXT MD VISIT: 10/11/24  OBJECTIVE:     TODAY'S TREATMENT: 09/11/24 Activity Comments                        TODAY'S TREATMENT: 08/24/24 Activity Comments  Improved grip strength and decreased pain with compression to medial epicondyle Demo/recommend trial of Golfer's elbow brace  Instructed in use of adaptive handles to use in context of cable machines to enable her to maintain resistance training Unilat performance using d-ring cuff to eliminate grip--tolerated well  Wrist flexor stretching   Explanation and demonstration of modalities to reduce pain, vibration and e-stim Provided her with our clinic TENS unit for at-home trial with good teach-back to set-up          PATIENT EDUCATION: Education details: prognosis, POC, HEP- advised to perform within pain free range, encouraged 10 minutes of ice over the R upper arm for edema Person educated: Patient Education method: Explanation, Demonstration, Tactile cues, Verbal cues, and  Handouts Education comprehension: verbalized understanding and returned demonstration  HOME EXERCISE PROGRAM: Access Code: [email or web address] URL: [email or web address] Date: 08/08/2024 Prepared by: [facility] - Outpatient  Rehab - [facility]  Exercises - Standing Wrist Flexion Stretch  - 1 x daily - 5 x weekly - 2 sets - 20 sec hold - Standing Single Arm Elbow Flexion with Resistance  - 1 x daily - 5 x weekly - 2 sets - 10 reps - Standing Elbow Extension with Self-Anchored Resistance  - 1 x daily - 5 x weekly - 2 sets - 10 reps  Note: Objective measures were completed at Evaluation unless otherwise noted.  DIAGNOSTIC FINDINGS:  none  PATIENT SURVEYS :  PSFS: THE PATIENT SPECIFIC FUNCTIONAL SCALE  Place score of 0-10 (0 = unable to perform activity and 10 = able to perform activity at the same level as before injury or problem)  Activity Date: 08/08/24    Draining pot of pasta 2/10    2. Machine strengthening at gym  0/10    3.     4.      Total Score 2/10      Total Score = Sum of activity scores/number of activities  Minimally Detectable Change: 3 points (for single activity); 2 points (for average score)  [facility] (nd). The Patient Specific Functional Scale . Retrieved from [email or web address]   COGNITION: Overall cognitive status: Within functional limits for tasks assessed     SENSATION: Intact to light touch in B UEs   POSTURE: WFL  PALPATION: TTP over R anterior humerus over biceps and medial wrist flexors with c/o N/T with pressure over medial epicondyle. C/o sensation of weakness in wrist with pressure over R wrist extensors. Mild edema over R biceps  UPPER EXTREMITY ROM:   Active ROM Right eval Left eval  Shoulder flexion    Shoulder extension    Shoulder abduction    Shoulder adduction    Shoulder internal rotation    Shoulder external rotation    Elbow flexion 145 144   Elbow extension 12 8  Wrist flexion 50 60  Wrist extension 60 55  Wrist ulnar deviation    Wrist radial deviation    Wrist pronation    Wrist supination    (  Blank rows = not tested)  UPPER EXTREMITY MMT:  MMT Right eval Left eval  Shoulder flexion    Shoulder extension    Shoulder abduction    Shoulder adduction    Shoulder internal rotation    Shoulder external rotation    Middle trapezius    Lower trapezius    Elbow flexion 4- 4+  Elbow extension 4- *pain in anterior arm 4+  Wrist flexion 4+ 4+  Wrist extension 4+ 4  Wrist ulnar deviation 4+ 4+  Wrist radial deviation 4+ 4  Wrist pronation    Wrist supination    Grip strength (lbs) 33.33 kg *initially able to perform brief grip to 60kg but not able to sustain it 21.33 kg  (Blank rows = not tested)                                                                                                                              TREATMENT DATE: 08/08/24     ASSESSMENT:  CLINICAL IMPRESSION:  Provided instruction in relevant compensations and adaptations to use at this time to enable return to activity in gradual manner and for improving RUE function for improving her strength and resuming to usual activity.  Discussion and review of pain control techniques/modalities to improve comfort/tolerance when performing repetitive tasks such as typing.  Good return demonstration to activities and continued sessions to advance POC details   OBJECTIVE IMPAIRMENTS: decreased activity tolerance, decreased ROM, decreased strength, increased edema, impaired flexibility, and pain.   ACTIVITY LIMITATIONS: carrying, lifting, hygiene/grooming, and caring for others  PARTICIPATION LIMITATIONS: meal prep, cleaning, laundry, shopping, community activity, occupation, and yard work  PERSONAL FACTORS: Age, Past/current experiences, Time since onset of injury/illness/exacerbation, and 3+ comorbidities: Anxiety, asthma, depression, HTN,  migraine, breast lumpectomy  are also affecting patient's functional outcome.   REHAB POTENTIAL: Good  CLINICAL DECISION MAKING: Evolving/moderate complexity  EVALUATION COMPLEXITY: Moderate   GOALS: Goals reviewed with patient? Yes  SHORT TERM GOALS: Target date: 08/29/2024  Patient to be independent with initial HEP. Baseline: HEP initiated Goal status: INITIAL    LONG TERM GOALS: Target date: 09/19/2024  Patient to be independent with advanced HEP. Baseline: Not yet initiated  Goal status: INITIAL  Patient to demonstrate R UE strength >/=4+/5.  Baseline: See above Goal status: INITIAL  Patient to demonstrate R elbow and wrist AROM WFL and without pain limiting.  Baseline: see above Goal status: INITIAL  Patient to report at least 3 point increase in individual items of PSFS to improve functional activity tolerance.  Baseline: see above  Goal status: INITIAL     PLAN: PT FREQUENCY: 1x/week  PT DURATION: 6 weeks  PLANNED INTERVENTIONS: 97164- PT Re-evaluation, 97110-Therapeutic exercises, 97530- Therapeutic activity, 97112- Neuromuscular re-education, 97535- Self Care, [id number]- Manual therapy, [id number]- Electrical stimulation (unattended), 97035- Ultrasound, 79439 (1-2 muscles), 20561 (3+ muscles)- Dry Needling, Patient/Family education, Balance training, Stair training, Taping, Joint mobilization, Spinal mobilization, Cryotherapy, and Moist heat  PLAN  FOR NEXT SESSION: review HEP and progress for elbow and wrist mobility/ROM, biceps/triceps strengthening, MT, nerve glides?     "

## 2024-09-11 ENCOUNTER — Ambulatory Visit: Admitting: Physical Therapy

## 2024-09-14 ENCOUNTER — Encounter: Payer: Self-pay | Admitting: Internal Medicine

## 2024-09-14 ENCOUNTER — Telehealth: Payer: Self-pay | Admitting: Physician Assistant

## 2024-09-14 DIAGNOSIS — Z0000 Encounter for general adult medical examination without abnormal findings: Secondary | ICD-10-CM

## 2024-09-14 NOTE — Progress Notes (Signed)
 This visit was for the patients spouse. Advised that we are unable to see him on her mychart and that they will have to re-register for him to be seen.

## 2024-09-14 NOTE — Patient Instructions (Signed)
 Carla Buck

## 2024-09-18 ENCOUNTER — Ambulatory Visit

## 2024-10-01 ENCOUNTER — Ambulatory Visit

## 2024-10-04 ENCOUNTER — Ambulatory Visit

## 2024-10-09 ENCOUNTER — Ambulatory Visit

## 2024-10-11 ENCOUNTER — Encounter: Payer: Self-pay | Admitting: Neurology

## 2024-10-26 ENCOUNTER — Ambulatory Visit: Admitting: Neurology

## 2024-11-01 ENCOUNTER — Ambulatory Visit: Admitting: Allergy
